# Patient Record
Sex: Female | Born: 1965 | Race: White | Hispanic: No | Smoking: Current every day smoker
Health system: Southern US, Community
[De-identification: ages and names within clinical notes are randomized; demographics above are authoritative.]

## PROBLEM LIST (undated history)

## (undated) DIAGNOSIS — F329 Major depressive disorder, single episode, unspecified: Secondary | ICD-10-CM

## (undated) DIAGNOSIS — I10 Essential (primary) hypertension: Secondary | ICD-10-CM

## (undated) DIAGNOSIS — R0602 Shortness of breath: Secondary | ICD-10-CM

## (undated) DIAGNOSIS — H547 Unspecified visual loss: Secondary | ICD-10-CM

## (undated) DIAGNOSIS — E039 Hypothyroidism, unspecified: Secondary | ICD-10-CM

## (undated) DIAGNOSIS — J449 Chronic obstructive pulmonary disease, unspecified: Secondary | ICD-10-CM

## (undated) DIAGNOSIS — M1990 Unspecified osteoarthritis, unspecified site: Secondary | ICD-10-CM

## (undated) DIAGNOSIS — E785 Hyperlipidemia, unspecified: Secondary | ICD-10-CM

## (undated) DIAGNOSIS — J9610 Chronic respiratory failure, unspecified whether with hypoxia or hypercapnia: Secondary | ICD-10-CM

## (undated) DIAGNOSIS — J45909 Unspecified asthma, uncomplicated: Secondary | ICD-10-CM

## (undated) DIAGNOSIS — E119 Type 2 diabetes mellitus without complications: Secondary | ICD-10-CM

## (undated) DIAGNOSIS — F419 Anxiety disorder, unspecified: Secondary | ICD-10-CM

## (undated) DIAGNOSIS — Z9889 Other specified postprocedural states: Secondary | ICD-10-CM

## (undated) DIAGNOSIS — E079 Disorder of thyroid, unspecified: Secondary | ICD-10-CM

## (undated) DIAGNOSIS — G4730 Sleep apnea, unspecified: Secondary | ICD-10-CM

## (undated) DIAGNOSIS — R112 Nausea with vomiting, unspecified: Secondary | ICD-10-CM

## (undated) DIAGNOSIS — H409 Unspecified glaucoma: Secondary | ICD-10-CM

## (undated) DIAGNOSIS — F32A Depression, unspecified: Secondary | ICD-10-CM

## (undated) HISTORY — DX: Type 2 diabetes mellitus without complications: E11.9

## (undated) HISTORY — PX: TONSILLECTOMY: SUR1361

## (undated) HISTORY — PX: ENDOMETRIAL ABLATION: SHX621

## (undated) HISTORY — DX: Anxiety disorder, unspecified: F41.9

## (undated) HISTORY — PX: TUBAL LIGATION: SHX77

---

## 2001-06-13 ENCOUNTER — Emergency Department (HOSPITAL_COMMUNITY): Admission: EM | Admit: 2001-06-13 | Discharge: 2001-06-13 | Payer: Self-pay

## 2001-06-26 ENCOUNTER — Emergency Department (HOSPITAL_COMMUNITY): Admission: EM | Admit: 2001-06-26 | Discharge: 2001-06-26 | Payer: Self-pay | Admitting: Emergency Medicine

## 2001-06-26 ENCOUNTER — Encounter: Payer: Self-pay | Admitting: Emergency Medicine

## 2002-06-06 ENCOUNTER — Emergency Department (HOSPITAL_COMMUNITY): Admission: EM | Admit: 2002-06-06 | Discharge: 2002-06-06 | Payer: Self-pay | Admitting: Emergency Medicine

## 2002-06-06 ENCOUNTER — Encounter: Payer: Self-pay | Admitting: Emergency Medicine

## 2004-11-28 ENCOUNTER — Emergency Department (HOSPITAL_COMMUNITY): Admission: EM | Admit: 2004-11-28 | Discharge: 2004-11-28 | Payer: Self-pay | Admitting: Emergency Medicine

## 2005-03-18 ENCOUNTER — Other Ambulatory Visit: Admission: RE | Admit: 2005-03-18 | Discharge: 2005-03-18 | Payer: Self-pay | Admitting: Obstetrics and Gynecology

## 2005-06-30 HISTORY — PX: ANKLE SURGERY: SHX546

## 2005-09-19 ENCOUNTER — Ambulatory Visit (HOSPITAL_BASED_OUTPATIENT_CLINIC_OR_DEPARTMENT_OTHER): Admission: RE | Admit: 2005-09-19 | Discharge: 2005-09-19 | Payer: Self-pay | Admitting: Family Medicine

## 2005-09-28 ENCOUNTER — Ambulatory Visit: Payer: Self-pay | Admitting: Internal Medicine

## 2006-09-15 ENCOUNTER — Inpatient Hospital Stay (HOSPITAL_COMMUNITY): Admission: EM | Admit: 2006-09-15 | Discharge: 2006-09-17 | Payer: Self-pay | Admitting: Emergency Medicine

## 2006-09-18 ENCOUNTER — Inpatient Hospital Stay (HOSPITAL_COMMUNITY): Admission: EM | Admit: 2006-09-18 | Discharge: 2006-09-25 | Payer: Self-pay | Admitting: Emergency Medicine

## 2010-08-02 ENCOUNTER — Encounter (HOSPITAL_COMMUNITY)
Admission: RE | Admit: 2010-08-02 | Discharge: 2010-08-02 | Disposition: A | Discharge status: Home / Self Care | Payer: BC Managed Care – PPO | Source: Ambulatory Visit | Attending: Obstetrics and Gynecology | Admitting: Obstetrics and Gynecology

## 2010-08-02 DIAGNOSIS — Z01812 Encounter for preprocedural laboratory examination: Secondary | ICD-10-CM | POA: Insufficient documentation

## 2010-08-02 DIAGNOSIS — Z01810 Encounter for preprocedural cardiovascular examination: Secondary | ICD-10-CM | POA: Insufficient documentation

## 2010-08-02 LAB — COMPREHENSIVE METABOLIC PANEL
AST: 26 U/L (ref 0–37)
CO2: 28 mEq/L (ref 19–32)
Calcium: 8.7 mg/dL (ref 8.4–10.5)
Creatinine, Ser: 0.54 mg/dL (ref 0.4–1.2)
GFR calc Af Amer: >60 mL/min (ref >60)
GFR calc non Af Amer: >60 mL/min (ref >60)
Glucose, Bld: 154 mg/dL — ABNORMAL HIGH (ref 70–99)

## 2010-08-02 LAB — CBC
Hemoglobin: 13.1 g/dL (ref 12.0–15.0)
MCH: 31 pg (ref 26.0–34.0)
MCHC: 33 g/dL (ref 30.0–36.0)

## 2010-08-09 ENCOUNTER — Ambulatory Visit (HOSPITAL_COMMUNITY)
Admission: RE | Admit: 2010-08-09 | Discharge: 2010-08-10 | Disposition: A | Discharge status: Home / Self Care | Payer: BC Managed Care – PPO | Source: Ambulatory Visit | Attending: Obstetrics and Gynecology | Admitting: Obstetrics and Gynecology

## 2010-08-09 ENCOUNTER — Other Ambulatory Visit: Payer: Self-pay | Admitting: Obstetrics and Gynecology

## 2010-08-09 DIAGNOSIS — N926 Irregular menstruation, unspecified: Secondary | ICD-10-CM | POA: Insufficient documentation

## 2010-08-09 DIAGNOSIS — Z01812 Encounter for preprocedural laboratory examination: Secondary | ICD-10-CM | POA: Insufficient documentation

## 2010-08-17 NOTE — Op Note (Addendum)
  [name], [name]             ACCOUNT NO.:  [id number]  MEDICAL RECORD NO.:  [id number]           PATIENT TYPE:  O  LOCATION:  SDC                           FACILITY:  [facility]  PHYSICIAN:  Guy Sandifer. Henderson Cloud, M.D. DATE OF BIRTH:  1966/06/13  DATE OF PROCEDURE:  08/09/2010 DATE OF DISCHARGE:  08/02/2010                              OPERATIVE REPORT   PREOPERATIVE DIAGNOSIS:  Irregular menses.  POSTOPERATIVE DIAGNOSIS:  Irregular menses.  PROCEDURE:  Hysteroscopy with dilatation and curettage.  SURGEON:  Guy Sandifer. Henderson Cloud, MD  ANESTHESIA:  General with LMA.  SPECIMENS:  Endometrial curettings to pathology.  ESTIMATED BLOOD LOSS:  Minimal.  I's and O's with distending media 0 mL deficit.  INDICATIONS AND CONSENT:  This patient is a 45 year old white female with irregular menses.  Attempted a sonohysterogram in the office was unsuccessful and cannulating the cervix.  Recommendation for hysteroscopy, D and C, possible resectoscope was made.  Potential risks and complications were discussed preoperatively including but limited to infection, uterine perforation, organ damage, bleeding requiring transfusion of blood products with HIV and hepatitis acquisition, DVT, PE, pneumonia, laparoscopy, laparotomy, recurrent abnormal bleeding. All questions had been answered and consent is signed on the chart.  FINDINGS:  Uterine cavities without abnormal structure.  Both fallopian tube and ostia are identified.  PROCEDURE:  The patient was taken to the operating room where she was identified.  She was placed in dorsal supine position and general anesthesia was induced via LMA.  She was then placed in dorsal lithotomy position.  Time-out undertaken.  She was then prepped with Betadine, bladder straight catheterized and she is draped in sterile fashion. Bivalve speculum was placed into the vagina.  The anterior cervical lip was injected with 1% plain Xylocaine and grasped with  single-tooth tenaculum.  Paracervical block was placed at 2, 4, 5, 7 and 10 o'clock positions with approximately 20 mL of the same solution.  Cervix was gently progressively dilated.  Diagnostic hysteroscope was placed in endocervical canal and advanced under direct visualization using distending media.  The above findings were noted.  Hysteroscope was withdrawn.  Sharp curettage was carried out for small amount of tissue.  Cavity was clean.  A good hemostasis was noted.  All instruments were removed.  All counts were correct.  The patient is awakened, taken to recovery room in stable condition.     Guy Sandifer Henderson Cloud, M.D.     JET/MEDQ  D:  08/09/2010  T:  08/09/2010  Job:  [id number]  Electronically Signed by Harold Hedge M.D. on 08/17/2010 12:09:50 PM
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
## 2010-11-15 NOTE — Discharge Summary (Signed)
[name], [name]             ACCOUNT NO.:  [id number]   MEDICAL RECORD NO.:  [id number]          PATIENT TYPE:  INP   LOCATION:  1509                         FACILITY:  [facility]   PHYSICIAN:  Kerrin Champagne, M.D.   DATE OF BIRTH:  [birth year]   DATE OF ADMISSION:  09/18/2006  DATE OF DISCHARGE:  09/25/2006                               DISCHARGE SUMMARY   ADDENDUM:  Initial discharge summary stated day of discharge to be September 23, 2006.  The patient actually stayed until September 25, 2006, secondary  to need for nursing home placement.  The social worker worked with [employer] finding a place to stay and obtaining payment for  this.  She was not authorized for skilled nursing facility until September 25, 2006, at which time [facility] and [employer] and [facility],  her carrier, had negotiated an acceptable plan for her transfer to a  nursing facility.  During the two additional days in the hospital, the  patient continued to receive physical therapy for ambulation and gait  training.  There was no change in her treatment plan.  She was initially  started on Cipro for urinary tract infection.  However, due to being on  Coumadin, she was switched to Septra DS one p.o. b.i.d. for her urinary  tract infection.  The patient did receive replacement splint and  rewrapping of her splint prior to discharge as well.  She was afebrile  and stable at the time of discharge to [facility] facility.  All  questions encouraged and answered.      Wende Neighbors, P.A.      Kerrin Champagne, M.D.  Electronically Signed    SMV/MEDQ  D:  11/10/2006  T:  11/10/2006  Job:  [id number]

## 2010-11-15 NOTE — Discharge Summary (Signed)
NAMELESLEE, SUIRE             ACCOUNT NO.:  [id number]   MEDICAL RECORD NO.:  [id number]          PATIENT TYPE:  INP   LOCATION:  1509                         FACILITY:  [facility]   PHYSICIAN:  Kerrin Champagne, M.D.   DATE OF BIRTH:  Sep 30, 1965   DATE OF ADMISSION:  09/18/2006  DATE OF DISCHARGE:  09/23/2006                         DISCHARGE SUMMARY - REFERRING   ADMISSION DIAGNOSES:  1. Status post open reduction and internal fixation of right ankle      fracture performed September 15, 2006, by Dr. August Saucer with discharge from      the hospital September 17, 2006, and failure to thrive after discharge      from home.  2. Morbid obesity.  3. History of hypothyroidism.  4. Blindness right eye.  5. Sleep apnea requiring CPAP machine.   DISCHARGE DIAGNOSES:  1. Status post open reduction and internal fixation of right ankle      fracture performed September 15, 2006, by Dr. August Saucer with discharge from      the hospital September 17, 2006, and failure to thrive after discharge      from home.  2. Morbid obesity.  3. History of hypothyroidism.  4. Blindness right eye.  5. Sleep apnea requiring CPAP machine.  6. Mild anemia.  7. Urinary tract infection.   PROCEDURE:  None during this hospitalization.   BRIEF HISTORY:  Patient is a 45 year old female who underwent open  reduction and internal fixation of a trimalleolar ankle fracture by Dr.  August Saucer on September 15, 2006.  She remained in the hospital until September 17, 2006, at which time she was discharged to her home.  At home patient was  unable to obtain help to get even from the bed to the chair.  She has  morbid obesity and her family members were not able to help her.  She  also had severe sleep apnea requiring a CPAP machine and often times is  quite somnolent and unsteady on her feet.  She returned to the emergency  room on September 18, 2006, with failure to thrive.  At that time she was  readmitted to the hospital for nursing home placement.  Upon  admission,  physical therapy was reinitiated.  She was nonweightbearing on the right  lower extremity.  She did have a posterior splint in place.  She was  instructed in ice and elevation during the hospital stay.  With physical  therapy, she was able to do bed to chair transfers only.  Patient was  treated by respiratory care for her CPAP machine.  Bariatric equipment  was made available for her during the hospital stay.  Case management  assisted in nursing home placement.  Patient eventually was offered a  bed at [facility] where she accepted their offer and will be  transferred for continuation of her rehabilitation with the final goal  being to return to her home.  Patient was on Coumadin for DVT  prophylaxis, monitored by the pharmacy at [facility].  Urinalysis has returned with urinary tract infection and patient will be  placed on Cipro the day  of discharge for her urinary tract infection.   Other pertinent laboratory values show patient to be mildly anemic with  hemoglobin 10.2, hematocrit 29.8.  Chemistry studies on September 20, 2006,  were within normal limits with the exception of calcium 8.3 and glucose  133.  Last INR on September 20, 2006, was 1.1.  Repeat x-rays of the ankle  showed excellent position and alignment of the trimalleolar ankle  fracture.  CT of the chest showed no evidence of pulmonary emboli or  thoracic aortic dissection/aneurysm.  Mild ground glass opacity suggests  pulmonary edema with inflammatory or infectious etiologies less likely,  moderate to severe emphysema.  Chest x-ray showed mild edema and  atelectasis.   EKG on September 18, 2006, showed normal sinus rhythm.   PLAN:  Patient will be transferred to [facility].  There  she should receive physical therapy on a daily basis for bed to chair  transfers and ambulation.  She is strict nonweightbearing on the right  lower extremity.  Her splint should remain dry and clean at all  times.  Elevation to the right lower extremity when she is at rest.  Patient  should receive occupational therapy for ADLs.   MEDICATIONS:  1. Colace 100 mg p.o. b.i.d.  2. Levothyroxine 150 mcg p.o. daily.  3. Coumadin which should be continued for at least six weeks      postoperatively for DVT prophylaxis.  This should be adjusted by      the physicians at the nursing facility.  Her last dose at the      hospital was 5 mg on a daily basis.  4. She is receiving Vicodin 1-2 every 4-6 hours as needed for pain.  5. Darvocet N 100 one every 6 hours as needed for mild pain.   Patient should follow up with Dr. August Saucer at his office in approximately 7-  10 days.  The appointment should be made by calling [phone].  She  should be on a regular diet.  A weight loss consult would be very  beneficial in this patient and therefore nutrition consult could  possibly be done at the nursing facility.  She should receive Durable  Medical Equipment which is safe from a bariatric standpoint.   CONDITION ON DISCHARGE:  Stable.   All questions encouraged and answered prior to discharge.      Wende Neighbors, P.A.      Kerrin Champagne, M.D.  Electronically Signed    SMV/MEDQ  D:  09/23/2006  T:  09/23/2006  Job:  [id number]
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
## 2010-11-15 NOTE — Procedures (Signed)
[name], [name]                ACCOUNT NO.:  [id number]   MEDICAL RECORD NO.:  [id number]          PATIENT TYPE:  OUT   LOCATION:  SLEEP CENTER                 FACILITY:  [facility]   PHYSICIAN:  Clinton D. Maple Hudson, M.D. DATE OF BIRTH:  [birth year]   DATE OF STUDY:  09/19/2005                              NOCTURNAL POLYSOMNOGRAM   REFERRING PHYSICIAN:  Dr. Herb Grays   INDICATION FOR STUDY:  Hypersomnia with sleep apnea.   EPWORTH SLEEPINESS SCORE:  11/24.  BMI 47.  Weight 280 pounds.   MEDICATIONS:  Levothyroxine.   SLEEP ARCHITECTURE:  Total sleep time 299 minutes with sleep efficiency 83%.  Stage I was 16%, stage II 43%, stages III and IV 15%.  REM 26% of total  sleep time.  Sleep latency 3 minutes, REM latency 4 minutes.  Awake after  sleep onset 59 minutes.  Arousal index markedly increased at 87 per hour  indicating marked sleep fragmentation.  No bedtime medication was reported   RESPIRATORY DATA:  Split study protocol.  Apnea/hypopnea index (AHI, RDI)  166.7 obstructive events per hour indicating very severe obstructive sleep  apnea/hypopnea syndrome before CPAP.  This included 241 obstructive apneas,  two central apneas, eight mixed apneas, and 88 hypopneas before CPAP  control.  Events were not positional.  REM AHI 41.5 per hour.  C pap was  titrated to 23 CWP, AHI 0 per hour.  A medium ResMed Ultra Mirage nasal/oral  mask was used with a heated humidifier.  She asked to take the mask off  twice during the night.  BiPAP will likely be required for comfort as CPAP  was tolerated poorly.   OXYGEN DATA:  Very loud snoring with oxygen desaturation to a nadir of 59%  before CPAP control.  After CPAP control saturation held at 90% on room air.   CARDIAC DATA:  Normal sinus rhythm.   MOVEMENT-PARASOMNIA:  Occasional leg jerk, insignificant.   IMPRESSIONS-RECOMMENDATIONS:  1.  Very severe obstructive sleep apnea/hypopnea syndrome, AHI 166.7 per      hour with non-positional  events, very loud snoring, and oxygen      desaturation to 59%.  2.  Successful CPAP control at 23 CWP, AHI 0 per hour.  A medium ResMed      ultra mirage nasal/oral mask was used with a heated humidifier.  CPAP      markedly improved her sleep fragmentation and sleep architecture as well      as oxygenation, but was not comfortably tolerated by the patient.      Suggest that initial trial be with BiPAP      inspiratory 23, expiratory 10 and possibly with my BiFlex.  She may also      require some experience with different masks and use of a sedative      hypnotic during her initial weeks of adjustment to home CPAP/BiPAP.      Clinton D. Maple Hudson, M.D.  Diplomate, Biomedical engineer of Sleep Medicine  Electronically Signed     CDY/MEDQ  D:  09/28/2005 11:26:42  T:  09/29/2005 12:01:43  Job:  [id number]
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
## 2010-11-15 NOTE — Op Note (Signed)
NAMENYJAH, SCHWAKE             ACCOUNT NO.:  [id number]   MEDICAL RECORD NO.:  [id number]          PATIENT TYPE:  INP   LOCATION:  8469                         FACILITY:  [facility]   PHYSICIAN:  Burnard Bunting, M.D.    DATE OF BIRTH:  [birth year]   DATE OF PROCEDURE:  09/15/2006  DATE OF DISCHARGE:                               OPERATIVE REPORT   PREOPERATIVE DIAGNOSIS:  Right trimalleolar ankle fracture.   POSTOPERATIVE DIAGNOSIS:  Right trimalleolar ankle fracture.   PROCEDURE:  Right trimalleolar ankle fracture open reduction/internal  fixation.   SURGEON:  Burnard Bunting, M.D.   ASSISTANT:  Jerolyn Shin. Tresa Res, M.D.   ANESTHESIA:  General endotracheal.   ESTIMATED BLOOD LOSS:  Minimal.   An ankle esmarch tourniquet utilized for one hour and 10 minutes.   INDICATIONS:  [name] is a 45 year old morbidly obese 385 pound  patient with trimalleolar right ankle fracture.  She presents now for  operative management after extensive and lengthy discussion of risks and  benefits.   PROCEDURE IN DETAIL:  Patient is brought to the operating room, where  general endotracheal anesthesia was received.  Prepped, antibiotics  administered.  Right ankle and leg and foot was prepped with DuraPrep  solution and draped in a sterile manner.  [name] was used to  __cover________  the operative field.  A 6 inch esmarch was utilized for  proximal ankle tourniquet.  A lateral approach to the lateral malleolus  was made.  Superficial peroneal nerve was identified and protected.  The  fracture site was identified.  The lateral malleolar fracture was  reduced.  One lag screw was placed proximal and anterior to the distal  posterior.  This was secured with an 8 hole.  A one-third tubular  locking plate with good fixation achieved.  Syndesmosis was stable.  The  incision was irrigated, partially closing with a 2-0 Vicryl suture.  The  medial malleolus was then addressed.  The skin was incised.   Fracture  was involved, both the anterior and posterior caliculus.  After  reduction of the fracture, the posterior caliculus was secured with one  4-0 cannulated cancellous screw.  The anterior caliculus was secured  with two 3.5 cannulated screws.  Reduction was confirmed in the AP and  lateral plane under fluoroscopic guidance.  Both incisions were  thoroughly irrigated.  The tourniquet was released.  Bleeding points  encountered were controlled using electrocautery.  The skin was closed  with interrupted inverted 2-0 Vicryl sutures followed by 3-0 nylon  sutures.  A bulky posterior splint was applied.  The patient was  extubated and transferred to the recovery room.   It should be noted that this patient is morbidly obese and required the  expert assistance of Dr. Vear Clock for retraction and protection  __________ neurovascular structures, due in large part to her size as  well as the nature of the case.  His help was indispensable in the  fixation of the fracture.      Burnard Bunting, M.D.  Electronically Signed     GSD/MEDQ  D:  09/15/2006  T:  09/16/2006  Job:  [id number]

## 2011-08-01 ENCOUNTER — Encounter (HOSPITAL_COMMUNITY): Payer: Self-pay | Admitting: Emergency Medicine

## 2011-08-01 ENCOUNTER — Emergency Department (HOSPITAL_COMMUNITY)
Admission: EM | Admit: 2011-08-01 | Discharge: 2011-08-01 | Disposition: A | Discharge status: Home / Self Care | Payer: Self-pay | Attending: Emergency Medicine | Admitting: Emergency Medicine

## 2011-08-01 DIAGNOSIS — S93409A Sprain of unspecified ligament of unspecified ankle, initial encounter: Secondary | ICD-10-CM | POA: Insufficient documentation

## 2011-08-01 DIAGNOSIS — S91209A Unspecified open wound of unspecified toe(s) with damage to nail, initial encounter: Secondary | ICD-10-CM

## 2011-08-01 DIAGNOSIS — W010XXA Fall on same level from slipping, tripping and stumbling without subsequent striking against object, initial encounter: Secondary | ICD-10-CM | POA: Insufficient documentation

## 2011-08-01 DIAGNOSIS — E785 Hyperlipidemia, unspecified: Secondary | ICD-10-CM | POA: Insufficient documentation

## 2011-08-01 DIAGNOSIS — E079 Disorder of thyroid, unspecified: Secondary | ICD-10-CM | POA: Insufficient documentation

## 2011-08-01 DIAGNOSIS — I10 Essential (primary) hypertension: Secondary | ICD-10-CM | POA: Insufficient documentation

## 2011-08-01 HISTORY — DX: Essential (primary) hypertension: I10

## 2011-08-01 HISTORY — DX: Hyperlipidemia, unspecified: E78.5

## 2011-08-01 HISTORY — DX: Disorder of thyroid, unspecified: E07.9

## 2011-08-01 MED ORDER — BACITRACIN ZINC 500 UNIT/GM EX OINT
TOPICAL_OINTMENT | Freq: Once | CUTANEOUS | Status: AC
  Administered 2011-08-01: 1 via TOPICAL
  Filled 2011-08-01: qty 0.9

## 2011-08-01 MED ORDER — HYDROCODONE-ACETAMINOPHEN 5-325 MG PO TABS
2.0000 | ORAL_TABLET | Freq: Once | ORAL | Status: AC
  Administered 2011-08-01: 2 via ORAL
  Filled 2011-08-01: qty 2

## 2011-08-01 MED ORDER — HYDROCODONE-ACETAMINOPHEN 5-500 MG PO TABS: 1.0000 | ORAL_TABLET | Freq: Four times a day (QID) | ORAL | Status: AC | PRN

## 2011-08-01 NOTE — ED Notes (Signed)
Dc instructions reviewed with pt and pt voiced understanding .splint applied and pt tolerated without difficulty. nad noted prior to dc. 1 script given to pt. Ambulated out without difficulty.

## 2011-08-01 NOTE — ED Provider Notes (Signed)
History    This chart was scribed for Julie Roots, MD, MD by Smitty Pluck. The patient was seen in room APA19 and the patient's care was started at 7:41AM.   CSN: [id number]  Arrival date & time 08/01/11  0719   First MD Initiated Contact with Patient 08/01/11 [phone]      Chief Complaint  Patient presents with  . Fall  . Ankle Pain    (Consider location/radiation/quality/duration/timing/severity/associated sxs/prior treatment) Patient is a 46 y.o. female presenting with fall and ankle pain. The history is provided by the patient.  Fall  Ankle Pain    [name] is a 46 y.o. female who presents to the Emergency Department complaining of moderate left ankle and foot pain after slipping on floor and twisting ankle onset today.Pt reports that she tore the great toe nail off of the left foot. The pain has been constant since the onset without radiation. Pt is able to walk on ankle but pain aggravates it. Pt denies LOC, knee injury and head injury. Pt reports that her last tetanus shot was 6 years ago.   Past Medical History  Diagnosis Date  . Thyroid disease   . Hypertension   . Hyperlipidemia     Past Surgical History  Procedure Date  . Tubal ligation   . Ankle surgery     History reviewed. No pertinent family history.  History  Substance Use Topics  . Smoking status: Current Everyday Smoker    Types: Cigarettes  . Smokeless tobacco: Not on file  . Alcohol Use: No    OB History    Grav Para Term Preterm Abortions TAB SAB Ect Mult Living                  Review of Systems  All other systems reviewed and are negative.  no headache. No neck or back pain. No foot numbness/weakness.  10 Systems reviewed and are negative for acute change except as noted in the HPI.  Allergies  Review of patient's allergies indicates no known allergies.  Home Medications  No current outpatient prescriptions on file.  BP 149/92  Pulse 94  Temp(Src) 98.6 F (37 C) (Oral)   Resp 18  Ht 5\' 5"  (1.651 m)  Wt 278 lb (126.1 kg)  BMI 46.26 kg/m2  SpO2 94%  LMP 07/25/2011  Physical Exam  Nursing note and vitals reviewed. Constitutional: She is oriented to person, place, and time. She appears well-developed and well-nourished. No distress.  HENT:  Head: Normocephalic and atraumatic.  Eyes: EOM are normal. Pupils are equal, round, and reactive to light.  Neck: Normal range of motion. Neck supple. No tracheal deviation present.  Cardiovascular: Normal rate, regular rhythm, normal heart sounds and intact distal pulses.   Pulmonary/Chest: Effort normal. No respiratory distress.  Abdominal: Soft. She exhibits no distension.  Musculoskeletal: Normal range of motion.       Mild swelling laterally No malleolar or bony tenderness Ankle stable  dp intact Avulsed toe nail No focal bony tenderness  Neurological: She is alert and oriented to person, place, and time.  Skin: Skin is warm and dry.  Psychiatric: She has a normal mood and affect. Her behavior is normal.    ED Course  Procedures (including critical care time)  DIAGNOSTIC STUDIES: Oxygen Saturation is 94% on room air, normal by my interpretation.    COORDINATION OF CARE: 7:45AM EDP ordered medication: Norco and bacitracin ointment 7:54AM EDP discusses pt ED treatment and at home care.  Pt is ready for discharge      MDM  aso brace applied to left ankle by staff. vicodin po for pain (confirmed nkda w pt, and pt states has ride home, does not have to drive). Pt asks about work note.       I personally performed the services described in this documentation, which was scribed in my presence. The recorded information has been reviewed and considered. Julie Roots, MD  Julie Roots, MD 08/01/11 [phone]

## 2011-08-01 NOTE — ED Notes (Signed)
Pt states she twisted her ankle this am and tore the great toe nail off the left foot.

## 2011-11-07 ENCOUNTER — Emergency Department (HOSPITAL_COMMUNITY)
Admission: EM | Admit: 2011-11-07 | Discharge: 2011-11-08 | Disposition: A | Discharge status: Home / Self Care | Payer: Self-pay | Attending: Emergency Medicine | Admitting: Emergency Medicine

## 2011-11-07 ENCOUNTER — Encounter (HOSPITAL_COMMUNITY): Payer: Self-pay | Admitting: *Deleted

## 2011-11-07 DIAGNOSIS — E785 Hyperlipidemia, unspecified: Secondary | ICD-10-CM | POA: Insufficient documentation

## 2011-11-07 DIAGNOSIS — Z79899 Other long term (current) drug therapy: Secondary | ICD-10-CM | POA: Insufficient documentation

## 2011-11-07 DIAGNOSIS — E079 Disorder of thyroid, unspecified: Secondary | ICD-10-CM | POA: Insufficient documentation

## 2011-11-07 DIAGNOSIS — J019 Acute sinusitis, unspecified: Secondary | ICD-10-CM | POA: Insufficient documentation

## 2011-11-07 DIAGNOSIS — I10 Essential (primary) hypertension: Secondary | ICD-10-CM | POA: Insufficient documentation

## 2011-11-07 MED ORDER — AMOXICILLIN 250 MG PO CAPS
500.0000 mg | ORAL_CAPSULE | Freq: Once | ORAL | Status: AC
  Administered 2011-11-07: 500 mg via ORAL
  Filled 2011-11-07: qty 2

## 2011-11-07 MED ORDER — PROMETHAZINE-CODEINE 6.25-10 MG/5ML PO SYRP: 5.0000 mL | ORAL_SOLUTION | ORAL | Status: AC | PRN

## 2011-11-07 MED ORDER — BENZONATATE 100 MG PO CAPS: 100.0000 mg | ORAL_CAPSULE | Freq: Three times a day (TID) | ORAL | Status: AC

## 2011-11-07 MED ORDER — BENZONATATE 100 MG PO CAPS
200.0000 mg | ORAL_CAPSULE | Freq: Once | ORAL | Status: AC
  Administered 2011-11-07: 200 mg via ORAL
  Filled 2011-11-07: qty 1

## 2011-11-07 MED ORDER — AMOXICILLIN 500 MG PO CAPS: 500.0000 mg | ORAL_CAPSULE | Freq: Three times a day (TID) | ORAL | Status: AC

## 2011-11-07 NOTE — ED Notes (Signed)
Cough, headache, green sputum .

## 2011-11-08 NOTE — Discharge Instructions (Signed)
Sinusitis Sinuses are air pockets within the bones of your face. The growth of bacteria within a sinus leads to infection. The infection prevents the sinuses from draining. This infection is called sinusitis. SYMPTOMS  There will be different areas of pain depending on which sinuses have become infected.  The maxillary sinuses often produce pain beneath the eyes.   Frontal sinusitis may cause pain in the middle of the forehead and above the eyes.  Other problems (symptoms) include:  Toothaches.   Colored, pus-like (purulent) drainage from the nose.   Swelling, warmth, and tenderness over the sinus areas may be signs of infection.  TREATMENT  Sinusitis is most often determined by an exam.X-rays may be taken. If x-rays have been taken, make sure you obtain your results or find out how you are to obtain them. Your caregiver may give you medications (antibiotics). These are medications that will help kill the bacteria causing the infection. You may also be given a medication (decongestant) that helps to reduce sinus swelling.  HOME CARE INSTRUCTIONS   Only take over-the-counter or prescription medicines for pain, discomfort, or fever as directed by your caregiver.   Drink extra fluids. Fluids help thin the mucus so your sinuses can drain more easily.   Applying either moist heat or ice packs to the sinus areas may help relieve discomfort.   Use saline nasal sprays to help moisten your sinuses. The sprays can be found at your local drugstore.  SEEK IMMEDIATE MEDICAL CARE IF:  You have a fever.   You have increasing pain, severe headaches, or toothache.   You have nausea, vomiting, or drowsiness.   You develop unusual swelling around the face or trouble seeing.  MAKE SURE YOU:   Understand these instructions.   Will watch your condition.   Will get help right away if you are not doing well or get worse.  Document Released: 06/16/2005 Document Revised: 06/05/2011 Document Reviewed:  01/13/2007 [facility] Patient Information 2012 [place], [place].   Take your next dose of Amoxil tomorrow morning.  You may use the Tessalon during the day to help control your coughing.  The Phenergan With Codeine will also help control your coughing but it is a narcotic and will make you drowsy.  Do not drive within 4 hours of taking this cough syrup.  Make sure you take the entire course of your antibiotics.  Get rechecked if your symptoms are not improving over the next week.  You may also benefit by getting some Coricidin brand decongestant which will help with her nasal congestion but should not jittery like Sudafed can normal at interfere with your blood pressure.

## 2011-11-09 NOTE — ED Provider Notes (Signed)
History     CSN: [id number]  Arrival date & time 11/07/11  2201   First MD Initiated Contact with Patient 11/07/11 2251      Chief Complaint  Patient presents with  . Cough    (Consider location/radiation/quality/duration/timing/severity/associated sxs/prior treatment) HPI Comments: [name] presents for treatment of a sinus infection,  Which she has experienced in the past.  Symptoms include frontal headache,  Pressure in her forehead,  Nasal congestion with purulent and blood tinged nasal congestion and post nasal drip.  She has had cough and subjective fevers.  She denies shortness of breath and wheezing.  She has tried tylenol cough and cold which has not relieved her symptoms but she blames on causing insomnia.     The history is provided by the patient.    Past Medical History  Diagnosis Date  . Thyroid disease   . Hypertension   . Hyperlipidemia     Past Surgical History  Procedure Date  . Tubal ligation   . Ankle surgery     History reviewed. No pertinent family history.  History  Substance Use Topics  . Smoking status: Current Everyday Smoker    Types: Cigarettes  . Smokeless tobacco: Not on file  . Alcohol Use: No    OB History    Grav Para Term Preterm Abortions TAB SAB Ect Mult Living                  Review of Systems  Constitutional: Positive for fever.  HENT: Positive for congestion, rhinorrhea, postnasal drip and sinus pressure. Negative for sore throat, trouble swallowing and neck pain.   Eyes: Negative.   Respiratory: Negative for chest tightness, shortness of breath and wheezing.   Cardiovascular: Negative for chest pain.  Gastrointestinal: Negative for nausea and abdominal pain.  Genitourinary: Negative.   Musculoskeletal: Negative for joint swelling and arthralgias.  Skin: Negative.  Negative for rash and wound.  Neurological: Negative for dizziness, weakness, light-headedness, numbness and headaches.  Hematological: Negative.    Psychiatric/Behavioral: Negative.     Allergies  Review of patient's allergies indicates no known allergies.  Home Medications   Current Outpatient Rx  Name Route Sig Dispense Refill  . ALBUTEROL SULFATE HFA 108 (90 BASE) MCG/ACT IN AERS Inhalation Inhale 2 puffs into the lungs every 6 (six) hours as needed. FOR WHEEZING/SHORTNESS OF BREATH    . ASPIRIN EC 81 MG PO TBEC Oral Take 81 mg by mouth daily.    . FENOFIBRATE MICRONIZED 43 MG PO CAPS Oral Take 43 mg by mouth daily.    Marland Kitchen LEVOTHYROXINE SODIUM 150 MCG PO TABS Oral Take 150 mcg by mouth daily.    Marland Kitchen LOSARTAN POTASSIUM-HCTZ 100-25 MG PO TABS Oral Take 1 tablet by mouth daily.    Marland Kitchen PHENYLEPHRINE-DM-GG 5-10-100 MG/5ML PO LIQD Oral Take 15 mLs by mouth as needed. FOR COUGH    . PSEUDOEPH-CPM-DM-APAP 30-2-15-325 MG PO TABS Oral Take 2 tablets by mouth as needed. FOR COUGH    . PSEUDOEPHEDRINE-GUAIFENESIN ER [phone] MG PO TB12 Oral Take 1 tablet by mouth 2 (two) times daily as needed. FOR SYMPTOMS    . AMOXICILLIN 500 MG PO CAPS Oral Take 1 capsule (500 mg total) by mouth 3 (three) times daily. 30 capsule 0  . BENZONATATE 100 MG PO CAPS Oral Take 1 capsule (100 mg total) by mouth every 8 (eight) hours. 21 capsule 0  . PROMETHAZINE-CODEINE 6.25-10 MG/5ML PO SYRP Oral Take 5 mLs by mouth every 4 (four)  hours as needed for cough. 120 mL 0    BP 120/68  Pulse 80  Temp(Src) 98.2 F (36.8 C) (Oral)  Resp 20  Ht 5\' 5"  (1.651 m)  Wt 270 lb (122.471 kg)  BMI 44.93 kg/m2  SpO2 94%  LMP 11/01/2011  Physical Exam  Nursing note and vitals reviewed. Constitutional: She appears well-developed and well-nourished.  HENT:  Head: Normocephalic and atraumatic.  Right Ear: Tympanic membrane normal.  Left Ear: Tympanic membrane normal.  Nose: Mucosal edema and rhinorrhea present. Left sinus exhibits frontal sinus tenderness.  Mouth/Throat: Uvula is midline, oropharynx is clear and moist and mucous membranes are normal.  Eyes: Conjunctivae are  normal.  Neck: Normal range of motion.  Cardiovascular: Normal rate, regular rhythm, normal heart sounds and intact distal pulses.   Pulmonary/Chest: Effort normal and breath sounds normal. She has no wheezes.  Abdominal: Soft. Bowel sounds are normal. There is no tenderness.  Musculoskeletal: Normal range of motion.  Neurological: She is alert.  Skin: Skin is warm and dry.  Psychiatric: She has a normal mood and affect.    ED Course  Procedures (including critical care time)  Labs Reviewed - No data to display No results found.   1. Acute sinusitis       MDM  Amoxil,  Tessalon given in ed and prescribed.  Also prescribed phenergan with codeine for night time cough and insomnia.  Encouraged to dc tylenol otc and replace with coricidin which will not interfere with bp and should not keep awake .  Recheck if not improved over the next week.  Also suggested warm compresses to face,  altoids or halls menthol drops for sinus pressure relief.        Burgess Amor, PA 11/09/11 1459

## 2011-11-09 NOTE — ED Provider Notes (Signed)
Medical screening examination/treatment/procedure(s) were performed by non-physician practitioner and as supervising physician I was immediately available for consultation/collaboration.  Barlow Harrison, MD 11/09/11 2354 

## 2012-01-22 ENCOUNTER — Emergency Department (HOSPITAL_COMMUNITY)
Admission: EM | Admit: 2012-01-22 | Discharge: 2012-01-22 | Disposition: A | Discharge status: Home / Self Care | Payer: Self-pay | Attending: Emergency Medicine | Admitting: Emergency Medicine

## 2012-01-22 ENCOUNTER — Encounter (HOSPITAL_COMMUNITY): Payer: Self-pay | Admitting: Emergency Medicine

## 2012-01-22 DIAGNOSIS — I8390 Asymptomatic varicose veins of unspecified lower extremity: Secondary | ICD-10-CM | POA: Insufficient documentation

## 2012-01-22 DIAGNOSIS — E079 Disorder of thyroid, unspecified: Secondary | ICD-10-CM | POA: Insufficient documentation

## 2012-01-22 DIAGNOSIS — I83899 Varicose veins of unspecified lower extremities with other complications: Secondary | ICD-10-CM

## 2012-01-22 DIAGNOSIS — I10 Essential (primary) hypertension: Secondary | ICD-10-CM | POA: Insufficient documentation

## 2012-01-22 DIAGNOSIS — F17200 Nicotine dependence, unspecified, uncomplicated: Secondary | ICD-10-CM | POA: Insufficient documentation

## 2012-01-22 DIAGNOSIS — E785 Hyperlipidemia, unspecified: Secondary | ICD-10-CM | POA: Insufficient documentation

## 2012-01-22 MED ORDER — LIDOCAINE HCL (PF) 1 % IJ SOLN
5.0000 mL | Freq: Once | INTRAMUSCULAR | Status: AC
  Administered 2012-01-22: 14:00:00

## 2012-01-22 MED ORDER — LIDOCAINE HCL (PF) 1 % IJ SOLN
INTRAMUSCULAR | Status: AC
  Filled 2012-01-22: qty 5

## 2012-01-22 NOTE — ED Notes (Signed)
Patient cut left ankle will shaving leg. Per patient small puncture wound above vein that keeps bleeding. Patient has soaked towel and chuck pad. Pressure applied. No active bleeding noted.

## 2012-01-22 NOTE — ED Provider Notes (Signed)
History     CSN: [id number]  Arrival date & time 01/22/12  1219   First MD Initiated Contact with Patient 01/22/12 1258      Chief Complaint  Patient presents with  . Puncture Wound    (Consider location/radiation/quality/duration/timing/severity/associated sxs/prior treatment) HPI Comments: Pt was shaving legs and cut varicose vein with subsequent bleeding.  The history is provided by the patient. No language interpreter was used.    Past Medical History  Diagnosis Date  . Thyroid disease   . Hypertension   . Hyperlipidemia     Past Surgical History  Procedure Date  . Tubal ligation   . Ankle surgery     Family History  Problem Relation Age of Onset  . Cancer Mother   . Heart failure Father     History  Substance Use Topics  . Smoking status: Current Everyday Smoker -- 0.5 packs/day for 30 years    Types: Cigarettes  . Smokeless tobacco: Never Used  . Alcohol Use: No    OB History    Grav Para Term Preterm Abortions TAB SAB Ect Mult Living   2 2 2       2       Review of Systems  Skin: Positive for wound.  Hematological: Does not bruise/bleed easily.  All other systems reviewed and are negative.    Allergies  Review of patient's allergies indicates no known allergies.  Home Medications   Current Outpatient Rx  Name Route Sig Dispense Refill  . ALBUTEROL SULFATE HFA 108 (90 BASE) MCG/ACT IN AERS Inhalation Inhale 2 puffs into the lungs every 6 (six) hours as needed. FOR WHEEZING/SHORTNESS OF BREATH    . ASPIRIN EC 81 MG PO TBEC Oral Take 81 mg by mouth daily.    . FENOFIBRATE MICRONIZED 43 MG PO CAPS Oral Take 43 mg by mouth daily.    Marland Kitchen LEVOTHYROXINE SODIUM 150 MCG PO TABS Oral Take 150 mcg by mouth daily.    Marland Kitchen LOSARTAN POTASSIUM-HCTZ 100-25 MG PO TABS Oral Take 1 tablet by mouth daily.    Marland Kitchen PHENYLEPHRINE-DM-GG 5-10-100 MG/5ML PO LIQD Oral Take 15 mLs by mouth as needed. FOR COUGH    . PSEUDOEPH-CPM-DM-APAP 30-2-15-325 MG PO TABS Oral Take 2  tablets by mouth as needed. FOR COUGH    . PSEUDOEPHEDRINE-GUAIFENESIN ER [phone] MG PO TB12 Oral Take 1 tablet by mouth 2 (two) times daily as needed. FOR SYMPTOMS      BP 146/94  Pulse 89  Temp 97.8 F (36.6 C) (Oral)  Resp 21  Ht 5\' 4"  (1.626 m)  Wt 270 lb (122.471 kg)  BMI 46.35 kg/m2  SpO2 92%  LMP 01/18/2012  Physical Exam  Nursing note and vitals reviewed. Constitutional: She is oriented to person, place, and time. She appears well-developed and well-nourished. No distress.  HENT:  Head: Normocephalic and atraumatic.  Eyes: EOM are normal.  Neck: Normal range of motion.  Cardiovascular: Normal rate, regular rhythm and normal heart sounds.   Pulmonary/Chest: Effort normal and breath sounds normal.  Abdominal: Soft. She exhibits no distension. There is no tenderness.  Musculoskeletal: Normal range of motion.       Feet:  Neurological: She is alert and oriented to person, place, and time.  Skin: Skin is warm and dry.  Psychiatric: She has a normal mood and affect. Judgment normal.    ED Course  LACERATION REPAIR Date/Time: 01/22/2012 1:15 PM Performed by: Evalina Field Authorized by: Evalina Field Consent: Verbal consent obtained. Written consent  not obtained. Risks and benefits: risks, benefits and alternatives were discussed Consent given by: patient Patient understanding: patient states understanding of the procedure being performed Patient consent: the patient's understanding of the procedure matches consent given Site marked: the operative site was not marked Imaging studies: imaging studies not available Patient identity confirmed: verbally with patient Time out: Immediately prior to procedure a "time out" was called to verify the correct patient, procedure, equipment, support staff and site/side marked as required. Anesthesia: local infiltration Local anesthetic: lidocaine 1% with epinephrine Anesthetic total: 1 ml Patient sedated: no Irrigation  solution: saline Irrigation method: syringe Amount of cleaning: standard Debridement: none Degree of undermining: none Skin closure: 4-0 nylon Technique: simple Approximation: close Approximation difficulty: simple Patient tolerance: Patient tolerated the procedure well with no immediate complications. Comments: Direct pressure and hemostasis achieved with a Q-tip.  Figure 8 suture used to stop bleeding.  Pressure dressing then applied.   (including critical care time)  Labs Reviewed - No data to display No results found.   1. Bleeding from varicose vein       MDM  Return as needed.        Evalina Field, [place] 01/22/12 1343

## 2012-01-23 NOTE — ED Provider Notes (Signed)
Medical screening examination/treatment/procedure(s) were performed by non-physician practitioner and as supervising physician I was immediately available for consultation/collaboration.  Dhriti Fales, MD 01/23/12 0737 

## 2012-05-13 ENCOUNTER — Emergency Department (HOSPITAL_COMMUNITY): Payer: Self-pay

## 2012-05-13 ENCOUNTER — Encounter (HOSPITAL_COMMUNITY): Payer: Self-pay | Admitting: Emergency Medicine

## 2012-05-13 ENCOUNTER — Emergency Department (HOSPITAL_COMMUNITY)
Admission: EM | Admit: 2012-05-13 | Discharge: 2012-05-13 | Disposition: A | Discharge status: Home / Self Care | Payer: Self-pay | Attending: Emergency Medicine | Admitting: Emergency Medicine

## 2012-05-13 DIAGNOSIS — S42309A Unspecified fracture of shaft of humerus, unspecified arm, initial encounter for closed fracture: Secondary | ICD-10-CM

## 2012-05-13 DIAGNOSIS — Z79899 Other long term (current) drug therapy: Secondary | ICD-10-CM | POA: Insufficient documentation

## 2012-05-13 DIAGNOSIS — Z7982 Long term (current) use of aspirin: Secondary | ICD-10-CM | POA: Insufficient documentation

## 2012-05-13 DIAGNOSIS — E785 Hyperlipidemia, unspecified: Secondary | ICD-10-CM | POA: Insufficient documentation

## 2012-05-13 DIAGNOSIS — Y9389 Activity, other specified: Secondary | ICD-10-CM | POA: Insufficient documentation

## 2012-05-13 DIAGNOSIS — F17200 Nicotine dependence, unspecified, uncomplicated: Secondary | ICD-10-CM | POA: Insufficient documentation

## 2012-05-13 DIAGNOSIS — E079 Disorder of thyroid, unspecified: Secondary | ICD-10-CM | POA: Insufficient documentation

## 2012-05-13 DIAGNOSIS — W19XXXA Unspecified fall, initial encounter: Secondary | ICD-10-CM | POA: Insufficient documentation

## 2012-05-13 DIAGNOSIS — S42293A Other displaced fracture of upper end of unspecified humerus, initial encounter for closed fracture: Secondary | ICD-10-CM | POA: Insufficient documentation

## 2012-05-13 DIAGNOSIS — I10 Essential (primary) hypertension: Secondary | ICD-10-CM | POA: Insufficient documentation

## 2012-05-13 DIAGNOSIS — Y9289 Other specified places as the place of occurrence of the external cause: Secondary | ICD-10-CM | POA: Insufficient documentation

## 2012-05-13 MED ORDER — OXYCODONE-ACETAMINOPHEN 5-325 MG PO TABS: 1.0000 | ORAL_TABLET | ORAL | Status: DC | PRN

## 2012-05-13 MED ORDER — HYDROMORPHONE HCL PF 1 MG/ML IJ SOLN
1.0000 mg | Freq: Once | INTRAMUSCULAR | Status: AC
  Administered 2012-05-13: 1 mg via INTRAVENOUS
  Filled 2012-05-13: qty 1

## 2012-05-13 MED ORDER — ONDANSETRON 8 MG PO TBDP: 8.0000 mg | ORAL_TABLET | Freq: Three times a day (TID) | ORAL | Status: DC | PRN

## 2012-05-13 NOTE — ED Provider Notes (Signed)
History  This chart was scribed for Hilario Quarry, MD by Manuela Schwartz, ED scribe. This patient was seen in room APA02/APA02 and the patient's care was started at 2017.  CSN: [id number]  Arrival date & time 05/13/12  2017   First MD Initiated Contact with Patient 05/13/12 2032     Chief Complaint  Patient presents with  . Shoulder Pain   Patient is a 46 y.o. female presenting with shoulder injury. The history is provided by the patient. No language interpreter was used.  Shoulder Injury This is a new problem. The current episode started 1 to 2 hours ago. The problem occurs constantly. The problem has not changed since onset.Pertinent negatives include no chest pain, no headaches and no shortness of breath. The symptoms are aggravated by bending. Nothing relieves the symptoms. She has tried nothing for the symptoms.   [name] is a 46 y.o. female who presents to the Emergency Department complaining of right shoulder pain after she fell this PM while in the kitchen putting something in the refrigerator and injured her right shoulder. She states pain of her right shoulder and unable to move it secondary to pain. She denies hitting her head or LOC. She denies any previous injuries to her right shoulder. She is a smoker.  Past Medical History  Diagnosis Date  . Thyroid disease   . Hypertension   . Hyperlipidemia     Past Surgical History  Procedure Date  . Tubal ligation   . Ankle surgery     Family History  Problem Relation Age of Onset  . Cancer Mother   . Heart failure Father     History  Substance Use Topics  . Smoking status: Current Every Day Smoker -- 0.5 packs/day for 30 years    Types: Cigarettes  . Smokeless tobacco: Never Used  . Alcohol Use: No    OB History    Grav Para Term Preterm Abortions TAB SAB Ect Mult Living   2 2 2       2       Review of Systems  Constitutional: Negative for fever and chills.  HENT: Negative for congestion.   Respiratory:  Negative for shortness of breath.   Cardiovascular: Negative for chest pain.  Gastrointestinal: Negative for nausea and vomiting.  Musculoskeletal: Negative for back pain.       Right shoulder pain  Skin: Negative for pallor.  Neurological: Negative for syncope, weakness, numbness and headaches.  All other systems reviewed and are negative.    Allergies  Review of patient's allergies indicates no known allergies.  Home Medications   Current Outpatient Rx  Name  Route  Sig  Dispense  Refill  . ALBUTEROL SULFATE (2.5 MG/3ML) 0.083% IN NEBU   Nebulization   Take 2.5 mg by nebulization every 6 (six) hours as needed. For shortness of breath         . ALBUTEROL SULFATE HFA 108 (90 BASE) MCG/ACT IN AERS   Inhalation   Inhale 2 puffs into the lungs every 6 (six) hours as needed. FOR WHEEZING/SHORTNESS OF BREATH         . ASPIRIN EC 81 MG PO TBEC   Oral   Take 81 mg by mouth daily.         . FENOFIBRATE MICRONIZED 130 MG PO CAPS   Oral   Take 130 mg by mouth daily.         Marland Kitchen LEVOTHYROXINE SODIUM 200 MCG PO TABS   Oral  Take 200 mcg by mouth daily.         Marland Kitchen LOSARTAN POTASSIUM-HCTZ 100-25 MG PO TABS   Oral   Take 1 tablet by mouth daily.           Triage Vitals: BP 149/85  Pulse 82  Temp 97.8 F (36.6 C) (Oral)  Resp 20  Ht 5\' 5"  (1.651 m)  Wt 270 lb (122.471 kg)  BMI 44.93 kg/m2  SpO2 93%  LMP 05/08/2012  Physical Exam  Nursing note and vitals reviewed. Constitutional: She is oriented to person, place, and time. She appears well-developed and well-nourished. No distress.       Morbidly obese  HENT:  Head: Normocephalic and atraumatic.  Eyes: EOM are normal. Pupils are equal, round, and reactive to light.       Proptotic eyes.  Neck: Neck supple. No tracheal deviation present.  Cardiovascular: Normal rate.   Pulmonary/Chest: Effort normal. No respiratory distress.  Abdominal: Soft. Bowel sounds are normal.  Musculoskeletal:       Right shoulder  is tender and is held in abduction. Her radial pulse is intact. She is nontender distal to the injury. She has full range of motion of her right elbow right wrist and right hand. Sensation is intact throughout her right upper extremity. The clavicle is nontender. Her cervical spine and thoracic spine are nontender to palpation.  Right ankle is tender.  Neurological: She is alert and oriented to person, place, and time.  Skin: Skin is warm and dry.  Psychiatric: She has a normal mood and affect. Her behavior is normal.    ED Course  Procedures (including critical care time) DIAGNOSTIC STUDIES: Oxygen Saturation is 93% on room air, normal by my interpretation.    COORDINATION OF CARE: At 830 PM Discussed treatment plan with patient which includes pain medicine. Patient agrees.   At 950 PM I discussed follow up wit  Labs Reviewed - No data to display Dg Shoulder Right  05/13/2012  *RADIOLOGY REPORT*  Clinical Data: Right shoulder pain secondary to a fall.  RIGHT SHOULDER - 2+ VIEW  Comparison: None.  Findings: There is a comminuted fracture of the humeral head with the slight displacement of the fragments.  The humeral head is not dislocated.  Minimal degenerative changes of the [facility] joint.  IMPRESSION: Comminuted displaced fracture of the right humeral head.   Original Report Authenticated By: Francene Boyers, M.D.      No diagnosis found.    MDM  Patient is having sling placed. I discussed the tear of her shoulder fracture with her. She voices understanding. She'll call Dr. Mort Sawyers office in the morning to followup. She is given prescription for Percocet and Zofran.  I personally performed the services described in this documentation, which was scribed in my presence. The recorded information has been reviewed and is accurate.         Hilario Quarry, MD 05/13/12 2214

## 2012-05-13 NOTE — ED Notes (Signed)
Dr Rosalia Hammers at bedside requesting clothes to be removed. Patient requested that we cut off her shirt and bra. Patient tolerated well.

## 2012-05-13 NOTE — ED Notes (Signed)
Right shoulder pain, fell an hour ago

## 2012-05-17 ENCOUNTER — Telehealth: Payer: Self-pay | Admitting: Orthopedic Surgery

## 2012-05-17 NOTE — Telephone Encounter (Signed)
I offered [name] an appointment with Dr. Romeo Apple 05/18/12 for her follow-up from the ER. She said she had a lot going on now And cannot take that appointment, she will call us back when she can come in. I reached her at [phone]

## 2012-06-26 ENCOUNTER — Emergency Department (HOSPITAL_COMMUNITY): Payer: Self-pay

## 2012-06-26 ENCOUNTER — Encounter (HOSPITAL_COMMUNITY): Payer: Self-pay | Admitting: *Deleted

## 2012-06-26 ENCOUNTER — Emergency Department (HOSPITAL_COMMUNITY)
Admission: EM | Admit: 2012-06-26 | Discharge: 2012-06-26 | Disposition: A | Discharge status: Home / Self Care | Payer: Self-pay | Attending: Emergency Medicine | Admitting: Emergency Medicine

## 2012-06-26 DIAGNOSIS — L02419 Cutaneous abscess of limb, unspecified: Secondary | ICD-10-CM | POA: Insufficient documentation

## 2012-06-26 DIAGNOSIS — L03115 Cellulitis of right lower limb: Secondary | ICD-10-CM

## 2012-06-26 DIAGNOSIS — I10 Essential (primary) hypertension: Secondary | ICD-10-CM | POA: Insufficient documentation

## 2012-06-26 DIAGNOSIS — R112 Nausea with vomiting, unspecified: Secondary | ICD-10-CM | POA: Insufficient documentation

## 2012-06-26 DIAGNOSIS — R609 Edema, unspecified: Secondary | ICD-10-CM | POA: Insufficient documentation

## 2012-06-26 DIAGNOSIS — E785 Hyperlipidemia, unspecified: Secondary | ICD-10-CM | POA: Insufficient documentation

## 2012-06-26 DIAGNOSIS — Z79899 Other long term (current) drug therapy: Secondary | ICD-10-CM | POA: Insufficient documentation

## 2012-06-26 DIAGNOSIS — F17200 Nicotine dependence, unspecified, uncomplicated: Secondary | ICD-10-CM | POA: Insufficient documentation

## 2012-06-26 DIAGNOSIS — L03119 Cellulitis of unspecified part of limb: Secondary | ICD-10-CM | POA: Insufficient documentation

## 2012-06-26 DIAGNOSIS — R509 Fever, unspecified: Secondary | ICD-10-CM | POA: Insufficient documentation

## 2012-06-26 DIAGNOSIS — Z7982 Long term (current) use of aspirin: Secondary | ICD-10-CM | POA: Insufficient documentation

## 2012-06-26 DIAGNOSIS — M25473 Effusion, unspecified ankle: Secondary | ICD-10-CM | POA: Insufficient documentation

## 2012-06-26 DIAGNOSIS — M25579 Pain in unspecified ankle and joints of unspecified foot: Secondary | ICD-10-CM | POA: Insufficient documentation

## 2012-06-26 DIAGNOSIS — M25476 Effusion, unspecified foot: Secondary | ICD-10-CM | POA: Insufficient documentation

## 2012-06-26 DIAGNOSIS — E079 Disorder of thyroid, unspecified: Secondary | ICD-10-CM | POA: Insufficient documentation

## 2012-06-26 LAB — BASIC METABOLIC PANEL
Chloride: 95 mEq/L — ABNORMAL LOW (ref 96–112)
GFR calc Af Amer: >90 mL/min (ref >90)
GFR calc non Af Amer: >90 mL/min (ref >90)
Glucose, Bld: 168 mg/dL — ABNORMAL HIGH (ref 70–99)
Potassium: 3.8 mEq/L (ref 3.5–5.1)
Sodium: 136 mEq/L (ref 135–145)

## 2012-06-26 LAB — CBC WITH DIFFERENTIAL/PLATELET
Eosinophils Absolute: 0.3 10*3/uL (ref 0.0–0.7)
Lymphs Abs: 1.8 10*3/uL (ref 0.7–4.0)
MCH: 33.1 pg (ref 26.0–34.0)
Neutro Abs: 6.6 10*3/uL (ref 1.7–7.7)
Neutrophils Relative %: 71 % (ref 43–77)
Platelets: 230 10*3/uL (ref 150–400)
RBC: 4.05 MIL/uL (ref 3.87–5.11)
WBC: 9.3 10*3/uL (ref 4.0–10.5)

## 2012-06-26 MED ORDER — VANCOMYCIN HCL IN DEXTROSE 1-5 GM/200ML-% IV SOLN
1000.0000 mg | Freq: Once | INTRAVENOUS | Status: AC
  Administered 2012-06-26: 1000 mg via INTRAVENOUS
  Filled 2012-06-26: qty 200

## 2012-06-26 MED ORDER — SULFAMETHOXAZOLE-TRIMETHOPRIM 800-160 MG PO TABS: 1.0000 | ORAL_TABLET | Freq: Two times a day (BID) | ORAL | Status: DC

## 2012-06-26 NOTE — ED Provider Notes (Signed)
History  Scribed for Julie Hutching, MD, the patient was seen in room APA04/APA04. This chart was scribed by Candelaria Stagers. The patient's care started at 9:28 PM   CSN: [id number]  Arrival date & time 06/26/12  2109   First MD Initiated Contact with Patient 06/26/12 2123      Chief Complaint  Patient presents with  . Cellulitis     The history is provided by the patient. No language interpreter was used.   [name] is a 46 y.o. female who presents to the Emergency Department complaining of swelling, redness, and pain to the right ankle that started today.  Pt has also experienced fever, chills nausea, and vomiting that started about four days ago.  Nothing seems to make the sx better or worse.      Past Medical History  Diagnosis Date  . Thyroid disease   . Hypertension   . Hyperlipidemia     Past Surgical History  Procedure Date  . Tubal ligation   . Ankle surgery     Family History  Problem Relation Age of Onset  . Cancer Mother   . Heart failure Father     History  Substance Use Topics  . Smoking status: Current Every Day Smoker -- 0.5 packs/day for 30 years    Types: Cigarettes  . Smokeless tobacco: Never Used  . Alcohol Use: No    OB History    Grav Para Term Preterm Abortions TAB SAB Ect Mult Living   2 2 2       2       Review of Systems  Constitutional: Positive for fever and chills.  Gastrointestinal: Positive for nausea and vomiting.  Musculoskeletal:       Redness, swelling, and pain around right ankle  All other systems reviewed and are negative.    Allergies  Review of patient's allergies indicates no known allergies.  Home Medications   Current Outpatient Rx  Name  Route  Sig  Dispense  Refill  . ALBUTEROL SULFATE (2.5 MG/3ML) 0.083% IN NEBU   Nebulization   Take 2.5 mg by nebulization every 6 (six) hours as needed. For shortness of breath         . ALBUTEROL SULFATE HFA 108 (90 BASE) MCG/ACT IN AERS   Inhalation  Inhale 2 puffs into the lungs every 6 (six) hours as needed. FOR WHEEZING/SHORTNESS OF BREATH         . ASPIRIN EC 81 MG PO TBEC   Oral   Take 81 mg by mouth daily.         . FENOFIBRATE MICRONIZED 130 MG PO CAPS   Oral   Take 130 mg by mouth daily.         Marland Kitchen LEVOTHYROXINE SODIUM 200 MCG PO TABS   Oral   Take 200 mcg by mouth daily.         Marland Kitchen LOSARTAN POTASSIUM-HCTZ 100-25 MG PO TABS   Oral   Take 1 tablet by mouth daily.         Marland Kitchen ONDANSETRON 8 MG PO TBDP   Oral   Take 1 tablet (8 mg total) by mouth every 8 (eight) hours as needed for nausea.   20 tablet   0   . OXYCODONE-ACETAMINOPHEN 5-325 MG PO TABS   Oral   Take 1 tablet by mouth every 4 (four) hours as needed for pain.   20 tablet   0     BP 124/51  Pulse 87  Temp 98.5 F (36.9 C) (Oral)  Resp 18  Ht 5\' 4"  (1.626 m)  Wt 240 lb (108.863 kg)  BMI 41.20 kg/m2  SpO2 92%  LMP 06/05/2012  Physical Exam  Nursing note and vitals reviewed. Constitutional: She is oriented to person, place, and time. She appears well-developed and well-nourished. No distress.       Morbidly obese  HENT:  Head: Normocephalic and atraumatic.  Eyes: EOM are normal.  Neck: Normal range of motion. Neck supple.  Pulmonary/Chest: No respiratory distress.  Musculoskeletal: Normal range of motion. She exhibits edema.       Right lower extremity from the distal 40% of tibia on down skin is erythematous scaly and puffy, including foot.      Neurological: She is alert and oriented to person, place, and time.  Skin: Skin is warm and dry. She is not diaphoretic.  Psychiatric: She has a normal mood and affect. Her behavior is normal.    ED Course  Procedures   DIAGNOSTIC STUDIES: Oxygen Saturation is 92% on room air, normal by my interpretation.    COORDINATION OF CARE:  9:35 PM Will give IV antibiotics for skin infection.  Pt understands and agrees.  9:42 PM Ordered: DG Ankle Complete Right; CBC with Differential; Basic  metabolic panel  Labs Reviewed  BASIC METABOLIC PANEL - Abnormal; Notable for the following:    Chloride 95 (*)     CO2 33 (*)     Glucose, Bld 168 (*)     All other components within normal limits  CBC WITH DIFFERENTIAL   Dg Ankle Complete Right  06/26/2012  *RADIOLOGY REPORT*  Clinical Data: Right ankle inflammation and erythema.  RIGHT ANKLE - COMPLETE 3+ VIEW  Comparison: Right ankle radiographs performed 09/22/2006  Findings: There is no evidence of fracture or dislocation.  No osseous erosions are seen to suggest osteomyelitis.  Mild diffuse soft tissue swelling is noted about the distal lower leg and ankle.  A plate and screws are noted along the distal fibula, and screws are seen at the medial malleolus.  There is no evidence of loosening of hardware.  Mild degenerative change is seen about the interosseous space and along the tibial plafond.  Posterior and plantar calcaneal spurs are incidentally seen.  The ankle mortise is difficult to fully assess due to surrounding degenerative change.  The visualized joint spaces are otherwise preserved.  Scattered soft tissue calcifications noted at the lower leg.  IMPRESSION:  1.  No evidence of fracture or dislocation. 2.  No osseous erosions seen to suggest osteomyelitis. 3.  Mild diffuse soft tissue swelling noted about the distal lower leg and ankle; this could conceivably reflect the patient's baseline. 4.  Hardware appears intact; new degenerative change noted about the ankle joint, particularly about the ankle mortise and interosseous space.   Original Report Authenticated By: Tonia Ghent, M.D.      No diagnosis found.    MDM  History and physical consistent with cellulitis of right lower extremity.  IV vancomycin.   Plain films of right ankle show no osteomyelitis.  Discharge home on Septra DS for 10 days.  I personally performed the services described in this documentation, which was scribed in my presence. The recorded information  has been reviewed and is accurate.        Julie Hutching, MD 06/26/12 [phone]

## 2012-06-26 NOTE — ED Notes (Signed)
Pt c/o swelling and pain to right leg, ankle, and foot. From knee down, pts leg is reddish, purple.

## 2012-11-02 ENCOUNTER — Encounter (HOSPITAL_COMMUNITY): Payer: Self-pay

## 2012-11-02 ENCOUNTER — Emergency Department (HOSPITAL_COMMUNITY): Payer: Self-pay

## 2012-11-02 ENCOUNTER — Inpatient Hospital Stay (HOSPITAL_COMMUNITY)
Admission: EM | Admit: 2012-11-02 | Discharge: 2012-11-06 | DRG: 189 | Disposition: A | Discharge status: Home Health | Payer: MEDICAID | Attending: Internal Medicine | Admitting: Internal Medicine

## 2012-11-02 ENCOUNTER — Encounter (HOSPITAL_COMMUNITY): Payer: Self-pay | Admitting: *Deleted

## 2012-11-02 ENCOUNTER — Emergency Department (HOSPITAL_COMMUNITY)
Admission: EM | Admit: 2012-11-02 | Discharge: 2012-11-02 | Discharge status: Against Medical Advice | Payer: Self-pay | Attending: Emergency Medicine | Admitting: Emergency Medicine

## 2012-11-02 DIAGNOSIS — W010XXA Fall on same level from slipping, tripping and stumbling without subsequent striking against object, initial encounter: Secondary | ICD-10-CM | POA: Diagnosis present

## 2012-11-02 DIAGNOSIS — E662 Morbid (severe) obesity with alveolar hypoventilation: Secondary | ICD-10-CM | POA: Diagnosis present

## 2012-11-02 DIAGNOSIS — S82852A Displaced trimalleolar fracture of left lower leg, initial encounter for closed fracture: Secondary | ICD-10-CM

## 2012-11-02 DIAGNOSIS — Z79899 Other long term (current) drug therapy: Secondary | ICD-10-CM | POA: Insufficient documentation

## 2012-11-02 DIAGNOSIS — Z8639 Personal history of other endocrine, nutritional and metabolic disease: Secondary | ICD-10-CM | POA: Insufficient documentation

## 2012-11-02 DIAGNOSIS — I959 Hypotension, unspecified: Secondary | ICD-10-CM

## 2012-11-02 DIAGNOSIS — Z720 Tobacco use: Secondary | ICD-10-CM

## 2012-11-02 DIAGNOSIS — F17200 Nicotine dependence, unspecified, uncomplicated: Secondary | ICD-10-CM | POA: Diagnosis present

## 2012-11-02 DIAGNOSIS — F411 Generalized anxiety disorder: Secondary | ICD-10-CM | POA: Insufficient documentation

## 2012-11-02 DIAGNOSIS — Z809 Family history of malignant neoplasm, unspecified: Secondary | ICD-10-CM

## 2012-11-02 DIAGNOSIS — Z8249 Family history of ischemic heart disease and other diseases of the circulatory system: Secondary | ICD-10-CM

## 2012-11-02 DIAGNOSIS — I10 Essential (primary) hypertension: Secondary | ICD-10-CM | POA: Insufficient documentation

## 2012-11-02 DIAGNOSIS — R0902 Hypoxemia: Secondary | ICD-10-CM | POA: Diagnosis present

## 2012-11-02 DIAGNOSIS — Z7982 Long term (current) use of aspirin: Secondary | ICD-10-CM | POA: Insufficient documentation

## 2012-11-02 DIAGNOSIS — J9620 Acute and chronic respiratory failure, unspecified whether with hypoxia or hypercapnia: Principal | ICD-10-CM

## 2012-11-02 DIAGNOSIS — I872 Venous insufficiency (chronic) (peripheral): Secondary | ICD-10-CM | POA: Diagnosis present

## 2012-11-02 DIAGNOSIS — J449 Chronic obstructive pulmonary disease, unspecified: Secondary | ICD-10-CM | POA: Insufficient documentation

## 2012-11-02 DIAGNOSIS — Z862 Personal history of diseases of the blood and blood-forming organs and certain disorders involving the immune mechanism: Secondary | ICD-10-CM | POA: Insufficient documentation

## 2012-11-02 DIAGNOSIS — S82853A Displaced trimalleolar fracture of unspecified lower leg, initial encounter for closed fracture: Secondary | ICD-10-CM

## 2012-11-02 DIAGNOSIS — E872 Acidosis, unspecified: Secondary | ICD-10-CM | POA: Diagnosis present

## 2012-11-02 DIAGNOSIS — J4489 Other specified chronic obstructive pulmonary disease: Secondary | ICD-10-CM | POA: Insufficient documentation

## 2012-11-02 DIAGNOSIS — E039 Hypothyroidism, unspecified: Secondary | ICD-10-CM | POA: Diagnosis present

## 2012-11-02 DIAGNOSIS — S82853D Displaced trimalleolar fracture of unspecified lower leg, subsequent encounter for closed fracture with routine healing: Secondary | ICD-10-CM

## 2012-11-02 DIAGNOSIS — E079 Disorder of thyroid, unspecified: Secondary | ICD-10-CM | POA: Insufficient documentation

## 2012-11-02 DIAGNOSIS — Z6841 Body Mass Index (BMI) 40.0 and over, adult: Secondary | ICD-10-CM

## 2012-11-02 DIAGNOSIS — Y9301 Activity, walking, marching and hiking: Secondary | ICD-10-CM | POA: Insufficient documentation

## 2012-11-02 DIAGNOSIS — Y92009 Unspecified place in unspecified non-institutional (private) residence as the place of occurrence of the external cause: Secondary | ICD-10-CM

## 2012-11-02 DIAGNOSIS — E785 Hyperlipidemia, unspecified: Secondary | ICD-10-CM | POA: Diagnosis present

## 2012-11-02 DIAGNOSIS — Y9289 Other specified places as the place of occurrence of the external cause: Secondary | ICD-10-CM | POA: Insufficient documentation

## 2012-11-02 DIAGNOSIS — L97909 Non-pressure chronic ulcer of unspecified part of unspecified lower leg with unspecified severity: Secondary | ICD-10-CM | POA: Diagnosis present

## 2012-11-02 HISTORY — DX: Chronic obstructive pulmonary disease, unspecified: J44.9

## 2012-11-02 LAB — BASIC METABOLIC PANEL
BUN: 10 mg/dL (ref 6–23)
Chloride: 94 mEq/L — ABNORMAL LOW (ref 96–112)
GFR calc Af Amer: >90 mL/min (ref >90)
Potassium: 3.9 mEq/L (ref 3.5–5.1)

## 2012-11-02 LAB — CBC WITH DIFFERENTIAL/PLATELET
HCT: 45.8 % (ref 36.0–46.0)
Hemoglobin: 15.2 g/dL — ABNORMAL HIGH (ref 12.0–15.0)
Lymphocytes Relative: 15 % (ref 12–46)
Monocytes Absolute: 0.4 10*3/uL (ref 0.1–1.0)
Monocytes Relative: 5 % (ref 3–12)
Neutro Abs: 7.1 10*3/uL (ref 1.7–7.7)
WBC: 9 10*3/uL (ref 4.0–10.5)

## 2012-11-02 MED ORDER — SODIUM CHLORIDE 0.9 % IV SOLN
1000.0000 mL | Freq: Once | INTRAVENOUS | Status: AC
  Administered 2012-11-02: 1000 mL via INTRAVENOUS

## 2012-11-02 MED ORDER — HYDROCODONE-ACETAMINOPHEN 5-325 MG PO TABS: 1.0000 | ORAL_TABLET | ORAL | Status: DC | PRN

## 2012-11-02 MED ORDER — SODIUM CHLORIDE 0.9 % IV SOLN
1000.0000 mL | INTRAVENOUS | Status: DC
  Administered 2012-11-02: 1000 mL via INTRAVENOUS

## 2012-11-02 MED ORDER — FENTANYL CITRATE 0.05 MG/ML IJ SOLN
50.0000 ug | Freq: Once | INTRAMUSCULAR | Status: AC
  Administered 2012-11-02: 50 ug via INTRAVENOUS
  Filled 2012-11-02: qty 2

## 2012-11-02 NOTE — ED Notes (Signed)
Pt reports history of COPD.  WHen ems arrived, pt's room air 02 sat was 88%.  EMS put her on 2liters of 02 and sat increased to 93%.  Pt says doesn't wear 02 at home. Denies any cough or SOB.

## 2012-11-02 NOTE — ED Notes (Signed)
Called friend of Pt ([place])at [phone] to pick her up and bring her clothes.

## 2012-11-02 NOTE — ED Notes (Signed)
Pt back from x-ray.

## 2012-11-02 NOTE — ED Notes (Signed)
Attempted to call Judieth Keens per pt's request. Call was directed to voicemail.

## 2012-11-02 NOTE — ED Notes (Signed)
Informed by prior shift nurse of pt's AMA status and that pt is waiting on ride that should be here in roughly 30 minutes. Patient has been given discharge papers and prescriptions and has no complaints at this time.

## 2012-11-02 NOTE — ED Notes (Signed)
Judieth Keens will be here to pick Pt up in about 30 minutes.

## 2012-11-02 NOTE — ED Notes (Signed)
Pt reports has had wound to r lower leg for past several weeks.  Area is round and scabbed over.  Reports initially looked like a boil.  Pt says she squeezed it 2 weeks ago but area won't heal.  Pt has dark discoloration to bilateral lower extremities.  Pedal pulses present.  Left ankle swollen.  Pt can wiggle toes.  Foot warm to touch.

## 2012-11-02 NOTE — ED Notes (Signed)
Pt reports slipped on a towel going into her bathroom and c/o pain and swelling to left ankle.

## 2012-11-02 NOTE — ED Notes (Signed)
Pt given meal tray by patient advocate while pt is waiting for ride home.

## 2012-11-02 NOTE — ED Notes (Signed)
Pt's 02 sat 0n 2 liters decreased to 84% while sleeping.  Increased o2 to 4 liters and sat increased to 94%.  Pt says has history of sleep apnea but no longer has the machine.  Leona Singleton PA aware.

## 2012-11-02 NOTE — ED Provider Notes (Signed)
This chart was scribed for Ward Givens, MD by Bennett Scrape, ED Scribe. This patient was seen in room APA03/APA03 and the patient's care was started at 10:50 AM.  HPI Comments: [name] is a 47 y.o. female who presents to the Emergency Department complaining of a slip and fall. Family member states that the pt was going into the bathroom when she slipped on a rug on the floor. Family states that the pt was unable to get up after the fall. He denies LOC stating that the pt called to him right after the fall. Pt reported to family member that she heard a "pop" at the time. She was not given any medications en route by EMS and family states that the pt is usually not as drowsy at baseline. Pt attributes this to medications given in the ED. Pt has no other injuries. Pt used to be on a cpap machine but last use was "years ago". He denies that the pt stops breathing during the night or snores loudly.  PE: CONSTITUIONAL:Morbidly obese, hard to keep awake PULMONARY: sonorus respirations EYES: eyes are dysconjugate SKIN: lower leg has chronic skin changes with thickening and redness of the skin, mild increased swelling around the left ankle   10:52 AM- Informed pt and pt's family of radiology results. Discussed plan to consult Ortho to determine treatment plan and pt agreed.   Medical screening examination/treatment/procedure(s) were conducted as a shared visit with non-physician practitioner(s) and myself.  I personally evaluated the patient during the encounter  Devoria Albe, MD, FACEP   I personally performed the services described in this documentation, which was scribed in my presence. The recorded information has been reviewed and considered.  Devoria Albe, MD, FACEP   Ward Givens, MD 11/02/12 1330

## 2012-11-02 NOTE — ED Provider Notes (Signed)
History     CSN: [id number]  Arrival date & time 11/02/12  7829   First MD Initiated Contact with Patient 11/02/12 [phone]      Chief Complaint  Patient presents with  . Ankle Pain    (Consider location/radiation/quality/duration/timing/severity/associated sxs/prior treatment) HPI Comments: [name] is a 47 y.o. Female presenting with pain and injury to her left ankle after slipping on a towel this morning walking in her bathroom.  She's been unable to bear weight secondary to pain and swelling.  She denies other injury, including hitting her head, denies leg hip and back pain, no neck pain.  She has had no LOC.  She was unable to get off of her floor, became very upset when EMS had to carry her out of her house.  She reports becoming very anxious and short of breath and was noted by EMS to desaturate to the low 80s.  She denies history of chronic or intermittent shortness of breath, although states she had COPD and sleep apnea years ago when she was morbidly obese, she lost weight and these problems resolved spontaneously about 8 years ago.  However, more recently has gained a lot of weight in, but is not currently feeling treated for sleep apnea.  She does use albuterol about once daily for wheezing.  She denies chest pain, denies coughing, denies peripheral edema.  She has sensation in her left foot and toes.     The history is provided by the patient.    Past Medical History  Diagnosis Date  . Thyroid disease   . Hypertension   . Hyperlipidemia   . COPD (chronic obstructive pulmonary disease)     Past Surgical History  Procedure Laterality Date  . Tubal ligation    . Ankle surgery      Family History  Problem Relation Age of Onset  . Cancer Mother   . Heart failure Father     History  Substance Use Topics  . Smoking status: Current Every Day Smoker -- 0.50 packs/day for 30 years    Types: Cigarettes  . Smokeless tobacco: Never Used  . Alcohol Use: No    OB  History   Grav Para Term Preterm Abortions TAB SAB Ect Mult Living   2 2 2       2       Review of Systems  Respiratory: Negative for cough and shortness of breath.   Cardiovascular: Negative for chest pain and leg swelling.  Musculoskeletal: Positive for joint swelling and arthralgias.  Skin: Positive for wound.  Neurological: Negative for weakness and numbness.  Psychiatric/Behavioral: The patient is nervous/anxious.     Allergies  Review of patient's allergies indicates no known allergies.  Home Medications   Current Outpatient Rx  Name  Route  Sig  Dispense  Refill  . aspirin EC 81 MG tablet   Oral   Take 81 mg by mouth daily.         . fenofibrate micronized (ANTARA) 130 MG capsule   Oral   Take 130 mg by mouth daily.         Marland Kitchen levothyroxine (SYNTHROID, LEVOTHROID) 200 MCG tablet   Oral   Take 200 mcg by mouth daily.         Marland Kitchen losartan-hydrochlorothiazide (HYZAAR) 100-25 MG per tablet   Oral   Take 1 tablet by mouth daily.         Marland Kitchen HYDROcodone-acetaminophen (NORCO/VICODIN) 5-325 MG per tablet   Oral   Take  1 tablet by mouth every 4 (four) hours as needed for pain.   15 tablet   0     BP 114/37  Pulse 72  Temp(Src) 97.7 F (36.5 C) (Oral)  Resp 22  Ht 5\' 5"  (1.651 m)  Wt 250 lb (113.399 kg)  BMI 41.6 kg/m2  SpO2 94%  LMP 10/28/2012  Physical Exam  Constitutional: She appears well-developed and well-nourished.  HENT:  Head: Atraumatic.  Neck: Normal range of motion.  Cardiovascular:  Pulses equal bilaterally  Musculoskeletal: She exhibits edema and tenderness.       Left ankle: She exhibits decreased range of motion and swelling. She exhibits normal pulse. Tenderness. Lateral malleolus tenderness found. No head of 5th metatarsal and no proximal fibula tenderness found. Achilles tendon normal.  Tender across left anterior proximal foot.  Neurological: She is alert. She has normal strength. She displays normal reflexes. No sensory deficit.   Equal strength  Skin: Skin is warm and dry.  Anterior tibia bilateral with darkened, indurated skin, right greater than left,  Shiny,  Appearance of chronic venous insufficiency.  There is a scab right mid tibia with no surrounding edema,  Fluctuance or drainage.  Psychiatric: She has a normal mood and affect.    ED Course  Procedures (including critical care time)  Labs Reviewed  BASIC METABOLIC PANEL - Abnormal; Notable for the following:    Chloride 94 (*)    CO2 36 (*)    Glucose, Bld 143 (*)    All other components within normal limits  CBC WITH DIFFERENTIAL - Abnormal; Notable for the following:    Hemoglobin 15.2 (*)    Neutrophils Relative 78 (*)    All other components within normal limits   Dg Ankle Complete Left  11/02/2012  *RADIOLOGY REPORT*  Clinical Data: Left ankle pain post fall this morning  LEFT ANKLE COMPLETE - 3+ VIEW  Comparison: None  Findings: Marked soft tissue swelling. Widening of medial ankle mortise. Oblique medial malleolar fracture, nondisplaced. Oblique lateral malleolar fracture, mildly displaced posteriorly and medially. Minimally displaced posterior malleolar fracture fragment. No dislocation or bone destruction. Large plantar calcaneal spur. Numerous soft tissue calcifications within distal lower leg.  IMPRESSION: Trimalleolar fractures left ankle.   Original Report Authenticated By: Ulyses Southward, M.D.    Dg Chest Port 1 View  11/02/2012  *RADIOLOGY REPORT*  Clinical Data: Shortness of breath  PORTABLE CHEST - 1 VIEW  Comparison: 09/18/2006  Findings: Cardiomediastinal silhouette is stable.  Mild perihilar increased interstitial markings suspicious for mild interstitial edema.  No focal infiltrate.  IMPRESSION: Mild bilateral perihilar interstitial prominence suspicious for mild edema.  No segmental infiltrate.   Original Report Authenticated By: Natasha Mead, M.D.      1. Trimalleolar fracture of ankle, closed, left, initial encounter   2. Hypoxia        MDM  Spoke with Dr Hilda Lias who recommends posterior splint who will follow while admitted.  Will plan medical admission due to hypoxia.  Pt remains drowsy in ed.  She wakes easily to voice.  She states she has not slept well this week with worrying about home finances, etc.  She becomes very tearful at times during interview.  She denies depression, suicidal ideation,  States she just worries a lot.   She desats down to the mid to low 80's off oxygen while here Yet denies sob.    With hypoxia,  Suspect return of sleep apnea,  Possible pickwickian syndrome.  She is not wheezing at  baseline,  Is retaining co2 per bmet.  cxr neg for infiltrate.   Discussed these findings with patient who refuses to be admitted today.  Discussed the possible dangers of low oxygen levels including heart strain,  Confusion, death.  States she has home oxygen at home she can use if needed.  Also states she has the use of a motorized wheelchair if needed to maintain nonweightbearing status, and much prefers to f/u with Dr. Hilda Lias as an outpatient  Call placed to Dr. Hilda Lias again to advise of pts plan. He will see her in his office tomorrow or the next.  Injury is not one currently requiring surgery,  But will if she bears weight.  This was strongly stressed with the patient.  She was prescribed a walker as well.  Posterior splint with stirrups placed by RN,  Examined post application - she can wiggle her toes, less than 3 sec cap refill.   Patient has left AMA.            Burgess Amor, PA-C 11/02/12 1640

## 2012-11-02 NOTE — ED Notes (Signed)
Pt back to department.  Pt was seen and treated previously for fractured ankle.  Injury has been treated, splinted, and follow up scheduled with orthopedic provider.  It was recommended pt be admitted, but pt refused admission.  Pt has returned to department stating that she has changed her mind about being admitted because she isn't able to get into her house. It was explained to pt that due to her previous refusal and being discharged AMA, provided that nothing has changed and she remains medically stable, it's possible that there may not be an accepting physician to admit.  Encouraged pt to develop plan for that possibility. Pt verbalized understanding.

## 2012-11-02 NOTE — ED Provider Notes (Signed)
History  This chart was scribed for Glynn Octave, MD by Shari Heritage, ED Scribe. The patient was seen in room APA07/APA07. Patient's care was started at 2338.   CSN: [id number]  Arrival date & time 11/02/12  2332   First MD Initiated Contact with Patient 11/02/12 2338      Chief Complaint  Patient presents with  . Ankle Injury     The history is provided by the patient. No language interpreter was used.    HPI Comments: [name] is a 47 y.o. female with history of hypertension, COPD, hyperlipidemia and thyroid disease who presents to the Emergency Department for the second time today complaining of severe, constant ankle pain and difficulty ambulating secondary to an ankle injury that occurred earlier today. Patient slipped on a towel this morning while walking around her home and is unable to bear weight secondary to pain and swelling. She was seen for this problem earlier today and X-rays showed trimalleolar fractures to the left ankle. Dr. Hilda Lias agreed to see patient today or tomorrow in his office for follow up. He recommended a posterior splint placement. Patient has returned to the ED this evening because she is unable to enter her apartment with her walker due to her injury. Patient also had low O2 sats during this visit. Patient left the ED AMA before admission could be arranged. O2 sats are currently 80-81%. Patient states that she had sleep apnea several years ago and was on continuous oxygen, but she isn't at this time.    Past Medical History  Diagnosis Date  . Thyroid disease   . Hypertension   . Hyperlipidemia   . COPD (chronic obstructive pulmonary disease)     Past Surgical History  Procedure Laterality Date  . Tubal ligation    . Ankle surgery      Family History  Problem Relation Age of Onset  . Cancer Mother   . Heart failure Father     History  Substance Use Topics  . Smoking status: Current Every Day Smoker -- 0.50 packs/day for 30 years   Types: Cigarettes  . Smokeless tobacco: Never Used  . Alcohol Use: No    OB History   Grav Para Term Preterm Abortions TAB SAB Ect Mult Living   2 2 2       2       Review of Systems A complete 10 system review of systems was obtained and all systems are negative except as noted in the HPI and PMH.    Allergies  Review of patient's allergies indicates no known allergies.  Home Medications   Current Outpatient Rx  Name  Route  Sig  Dispense  Refill  . aspirin EC 81 MG tablet   Oral   Take 81 mg by mouth daily.         . fenofibrate micronized (ANTARA) 130 MG capsule   Oral   Take 130 mg by mouth daily.         Marland Kitchen HYDROcodone-acetaminophen (NORCO/VICODIN) 5-325 MG per tablet   Oral   Take 1 tablet by mouth every 4 (four) hours as needed for pain.   15 tablet   0   . levothyroxine (SYNTHROID, LEVOTHROID) 200 MCG tablet   Oral   Take 200 mcg by mouth daily.         Marland Kitchen losartan-hydrochlorothiazide (HYZAAR) 100-25 MG per tablet   Oral   Take 1 tablet by mouth daily.  BP 107/55  Pulse 68  Temp(Src) 97.4 F (36.3 C) (Oral)  Resp 21  Ht 5\' 5"  (1.651 m)  Wt 300 lb (136.079 kg)  BMI 49.92 kg/m2  SpO2 87%  LMP 10/28/2012  Physical Exam  Constitutional: She is oriented to person, place, and time. She appears well-developed and well-nourished.  Morbidly obese.  HENT:  Head: Normocephalic and atraumatic.  Eyes: Conjunctivae and EOM are normal. Pupils are equal, round, and reactive to light.  Neck: Normal range of motion. Neck supple.  Cardiovascular: Normal rate, regular rhythm and normal heart sounds.   Pulmonary/Chest: Tachypnea (mild) noted. She has decreased breath sounds (throughout). She has no wheezes.  Musculoskeletal:  Dark, ecchymotic skin to right shin with scabbed over lesion. Splint in place on left ankle. Capillary refill and toe wiggle intact.  Neurological: She is alert and oriented to person, place, and time.  Skin: Skin is warm and  dry.    ED Course  Procedures (including critical care time)  COORDINATION OF CARE: 11:48 PM- Patient informed of current plan for treatment and evaluation and agrees with plan at this time.      Labs Reviewed  COMPREHENSIVE METABOLIC PANEL - Abnormal; Notable for the following:    Chloride 94 (*)    CO2 33 (*)    Glucose, Bld 140 (*)    All other components within normal limits  BLOOD GAS, ARTERIAL - Abnormal; Notable for the following:    pCO2 arterial 56.5 (*)    pO2, Arterial 75.6 (*)    Bicarbonate 32.7 (*)    Acid-Base Excess 7.6 (*)    All other components within normal limits  D-DIMER, QUANTITATIVE - Abnormal; Notable for the following:    D-Dimer, Quant 2.20 (*)    All other components within normal limits  CBC WITH DIFFERENTIAL  TROPONIN I   Dg Ankle Complete Left  11/02/2012  *RADIOLOGY REPORT*  Clinical Data: Left ankle pain post fall this morning  LEFT ANKLE COMPLETE - 3+ VIEW  Comparison: None  Findings: Marked soft tissue swelling. Widening of medial ankle mortise. Oblique medial malleolar fracture, nondisplaced. Oblique lateral malleolar fracture, mildly displaced posteriorly and medially. Minimally displaced posterior malleolar fracture fragment. No dislocation or bone destruction. Large plantar calcaneal spur. Numerous soft tissue calcifications within distal lower leg.  IMPRESSION: Trimalleolar fractures left ankle.   Original Report Authenticated By: Ulyses Southward, M.D.    Ct Angio Chest Pe W/cm &/or Wo Cm  11/03/2012  *RADIOLOGY REPORT*  Clinical Data: 47 year old female with shortness of breath, hypoxia.  Recent ankle fracture.  CT ANGIOGRAPHY CHEST  Technique:  Multidetector CT imaging of the chest using the standard protocol during bolus administration of intravenous contrast. Multiplanar reconstructed images including MIPs were obtained and reviewed to evaluate the vascular anatomy.  Contrast: OMNIPAQUE IOHEXOL 350 MG/ML SOLN  Comparison: 09/18/2006.   Findings: Suboptimal contrast bolus timing in the pulmonary arterial tree.  No central pulmonary artery filling defect.  Motion artifact at the lung bases.  No convincing pulmonary artery filling defect identified to suggest acute pulmonary embolus.  Increased mediastinal lipomatosis.  Mild cardiomegaly.  No pericardial effusion.  Atherosclerosis of the aortic arch and great vessel origins, otherwise negative visualized aorta.  Stable mediastinal lymph nodes.  Negative visualized thoracic inlet.  Major airways are patent.  Chronic central lobular upper lobe emphysema, moderate to severe.  Dependent pulmonary atelectasis. No consolidation. No pleural effusions.  Chronic posterior rib fractures more numerous on the left. No acute osseous abnormality identified.  Cholelithiasis with multiple rim calcified dependent gallstones. Decreased density throughout the visualized liver compatible with steatosis.  Negative visualized spleen and pancreas.  There is a large low-density (18 HU) rim calcified lesion occupying the area of the right adrenal gland, measuring up to 67 mm diameter. This is not included on the prior study.  This does not appear to be associated with the right kidney. No normal right adrenal gland is visible.  The left adrenal gland is normal.  No upper abdominal lymphadenopathy.  IMPRESSION: 1.  Suboptimal contrast bolus timing but no convincing evidence of acute pulmonary embolus. 2.  Emphysema with lung base atelectasis. 3. Cystic rim calcified mass occupying the area of the right adrenal gland.  Favor sequelae of prior adrenal hemorrhage.  Other etiologies (adrenal neoplasm) less likely. 4.  Hepatic steatosis.  Cholelithiasis.   Original Report Authenticated By: Erskine Speed, M.D.    Dg Chest Portable 1 View  11/03/2012  *RADIOLOGY REPORT*  Clinical Data: 47 year old female hypoxemia.  Ankle injury.  PORTABLE CHEST - 1 VIEW  Comparison: 11/02/2012 and earlier.  Findings: Portable upright AP view 1125  hours.  Stable lung volumes.  Stable perihilar and basilar linear opacity.  Interval decrease vascular congestion.  No pneumothorax, pleural effusion or acute pulmonary opacity identified.  Stable cardiac size and mediastinal contours.  Visualized tracheal air column is within normal limits.  IMPRESSION: Interval resolved vascular congestion.  Mild bibasilar streaky opacity felt related to chronic lung disease. No superimposed acute findings are identified.   Original Report Authenticated By: Erskine Speed, M.D.    Dg Chest Port 1 View  11/02/2012  *RADIOLOGY REPORT*  Clinical Data: Shortness of breath  PORTABLE CHEST - 1 VIEW  Comparison: 09/18/2006  Findings: Cardiomediastinal silhouette is stable.  Mild perihilar increased interstitial markings suspicious for mild interstitial edema.  No focal infiltrate.  IMPRESSION: Mild bilateral perihilar interstitial prominence suspicious for mild edema.  No segmental infiltrate.   Original Report Authenticated By: Natasha Mead, M.D.      1. Hypoxia   2. Obesity hypoventilation syndrome   3. Trimalleolar fracture of ankle, closed, left, initial encounter       MDM  Seen earlier today after mechanical fall and found to have ankle fracture. Medical admission for hypoxia recommended but the patient refused. She returns with worsening pain and states she cannot get into her house. Oxygen saturation in the 70s on arrival with mild tachypnea. Clear lungs.  Chest x-ray negative. ABG shows mild respiratory acidosis. D-dimer elevated. Suspect patient's hypoxia secondary to obesity hypoventilation syndrome and obstructive sleep apnea.  No evidence of PE on CT.  O2 saturation in mid 90s on 4L. No distress.   Date: 11/02/2012  Rate: 67  Rhythm: normal sinus rhythm  QRS Axis: normal  Intervals: normal  ST/T Wave abnormalities: normal  Conduction Disutrbances:none  Narrative Interpretation:   Old EKG Reviewed: unchanged      I personally performed the  services described in this documentation, which was scribed in my presence. The recorded information has been reviewed and is accurate.    Glynn Octave, MD 11/03/12 [phone]

## 2012-11-03 ENCOUNTER — Emergency Department (HOSPITAL_COMMUNITY): Payer: Self-pay

## 2012-11-03 ENCOUNTER — Encounter (HOSPITAL_COMMUNITY): Payer: Self-pay | Admitting: *Deleted

## 2012-11-03 DIAGNOSIS — E785 Hyperlipidemia, unspecified: Secondary | ICD-10-CM | POA: Diagnosis present

## 2012-11-03 DIAGNOSIS — S82853A Displaced trimalleolar fracture of unspecified lower leg, initial encounter for closed fracture: Secondary | ICD-10-CM

## 2012-11-03 DIAGNOSIS — Z720 Tobacco use: Secondary | ICD-10-CM

## 2012-11-03 DIAGNOSIS — R0902 Hypoxemia: Secondary | ICD-10-CM | POA: Diagnosis present

## 2012-11-03 DIAGNOSIS — E039 Hypothyroidism, unspecified: Secondary | ICD-10-CM | POA: Diagnosis present

## 2012-11-03 DIAGNOSIS — I10 Essential (primary) hypertension: Secondary | ICD-10-CM | POA: Diagnosis present

## 2012-11-03 DIAGNOSIS — E662 Morbid (severe) obesity with alveolar hypoventilation: Secondary | ICD-10-CM | POA: Diagnosis present

## 2012-11-03 LAB — CBC WITH DIFFERENTIAL/PLATELET
Basophils Absolute: 0 10*3/uL (ref 0.0–0.1)
Basophils Relative: 0 % (ref 0–1)
Eosinophils Absolute: 0.1 10*3/uL (ref 0.0–0.7)
Eosinophils Relative: 1 % (ref 0–5)
Lymphs Abs: 1.5 10*3/uL (ref 0.7–4.0)
MCH: 31.7 pg (ref 26.0–34.0)
MCHC: 32.8 g/dL (ref 30.0–36.0)
MCV: 96.6 fL (ref 78.0–100.0)
Neutrophils Relative %: 77 % (ref 43–77)
Platelets: 240 10*3/uL (ref 150–400)
RBC: 4.67 MIL/uL (ref 3.87–5.11)
RDW: 15.3 % (ref 11.5–15.5)

## 2012-11-03 LAB — BLOOD GAS, ARTERIAL
Bicarbonate: 32.7 mEq/L — ABNORMAL HIGH (ref 20.0–24.0)
Bicarbonate: 35.6 mEq/L — ABNORMAL HIGH (ref 20.0–24.0)
Delivery systems: POSITIVE
Inspiratory PAP: 20
O2 Saturation: 82.3 %
Patient temperature: 37
Patient temperature: 37
Patient temperature: 37
Pressure support: 12 cmH2O
TCO2: 28.7 mmol/L (ref 0–100)
pCO2 arterial: 56.5 mmHg — ABNORMAL HIGH (ref 35.0–45.0)
pCO2 arterial: 74.7 mmHg (ref 35.0–45.0)
pH, Arterial: 7.286 — ABNORMAL LOW (ref 7.350–7.450)
pH, Arterial: 7.291 — ABNORMAL LOW (ref 7.350–7.450)
pH, Arterial: 7.381 (ref 7.350–7.450)

## 2012-11-03 LAB — D-DIMER, QUANTITATIVE: D-Dimer, Quant: 2.2 ug/mL-FEU — ABNORMAL HIGH (ref 0.00–0.48)

## 2012-11-03 LAB — COMPREHENSIVE METABOLIC PANEL
ALT: 29 U/L (ref 0–35)
AST: 30 U/L (ref 0–37)
Albumin: 3.6 g/dL (ref 3.5–5.2)
Alkaline Phosphatase: 92 U/L (ref 39–117)
Calcium: 9.2 mg/dL (ref 8.4–10.5)
GFR calc Af Amer: >90 mL/min (ref >90)
Glucose, Bld: 140 mg/dL — ABNORMAL HIGH (ref 70–99)
Potassium: 3.9 mEq/L (ref 3.5–5.1)
Sodium: 135 mEq/L (ref 135–145)
Total Protein: 7.6 g/dL (ref 6.0–8.3)

## 2012-11-03 LAB — HEMOGLOBIN A1C: Mean Plasma Glucose: 186 mg/dL — ABNORMAL HIGH (ref <117)

## 2012-11-03 LAB — BILIRUBIN, DIRECT: Bilirubin, Direct: 0.1 mg/dL (ref 0.0–0.3)

## 2012-11-03 MED ORDER — LEVOTHYROXINE SODIUM 100 MCG PO TABS
200.0000 ug | ORAL_TABLET | Freq: Every day | ORAL | Status: DC
  Administered 2012-11-03 – 2012-11-06 (×4): 200 ug via ORAL
  Filled 2012-11-03 (×4): qty 2

## 2012-11-03 MED ORDER — FENOFIBRATE 160 MG PO TABS
160.0000 mg | ORAL_TABLET | Freq: Every day | ORAL | Status: DC
  Administered 2012-11-03 – 2012-11-06 (×4): 160 mg via ORAL
  Filled 2012-11-03 (×7): qty 1

## 2012-11-03 MED ORDER — ENOXAPARIN SODIUM 40 MG/0.4ML ~~LOC~~ SOLN
40.0000 mg | SUBCUTANEOUS | Status: DC
  Administered 2012-11-03: 40 mg via SUBCUTANEOUS
  Filled 2012-11-03: qty 0.4

## 2012-11-03 MED ORDER — ONDANSETRON HCL 4 MG/2ML IJ SOLN: 4.0000 mg | Freq: Three times a day (TID) | INTRAMUSCULAR | Status: DC | PRN

## 2012-11-03 MED ORDER — ACETAMINOPHEN 325 MG PO TABS: 650.0000 mg | ORAL_TABLET | ORAL | Status: DC | PRN

## 2012-11-03 MED ORDER — FAMOTIDINE 20 MG PO TABS
20.0000 mg | ORAL_TABLET | Freq: Two times a day (BID) | ORAL | Status: DC
  Administered 2012-11-03 – 2012-11-06 (×7): 20 mg via ORAL
  Filled 2012-11-03 (×6): qty 1

## 2012-11-03 MED ORDER — SODIUM CHLORIDE 0.9 % IJ SOLN
3.0000 mL | Freq: Two times a day (BID) | INTRAMUSCULAR | Status: DC
  Administered 2012-11-03 – 2012-11-06 (×4): 3 mL via INTRAVENOUS

## 2012-11-03 MED ORDER — ONDANSETRON HCL 4 MG/2ML IJ SOLN: 4.0000 mg | INTRAMUSCULAR | Status: DC | PRN

## 2012-11-03 MED ORDER — ASPIRIN EC 81 MG PO TBEC
81.0000 mg | DELAYED_RELEASE_TABLET | Freq: Every day | ORAL | Status: DC
  Administered 2012-11-03 – 2012-11-06 (×4): 81 mg via ORAL
  Filled 2012-11-03 (×4): qty 1

## 2012-11-03 MED ORDER — LOSARTAN POTASSIUM-HCTZ 100-25 MG PO TABS: 1.0000 | ORAL_TABLET | Freq: Every day | ORAL | Status: DC

## 2012-11-03 MED ORDER — FLEET ENEMA 7-19 GM/118ML RE ENEM: 1.0000 | ENEMA | Freq: Once | RECTAL | Status: AC | PRN

## 2012-11-03 MED ORDER — SODIUM CHLORIDE 0.9 % IV SOLN
INTRAVENOUS | Status: AC
  Administered 2012-11-03: 04:00:00 via INTRAVENOUS

## 2012-11-03 MED ORDER — HYDROCHLOROTHIAZIDE 25 MG PO TABS
25.0000 mg | ORAL_TABLET | Freq: Every day | ORAL | Status: DC
  Administered 2012-11-03: 25 mg via ORAL
  Filled 2012-11-03: qty 1

## 2012-11-03 MED ORDER — BISACODYL 10 MG RE SUPP: 10.0000 mg | Freq: Every day | RECTAL | Status: DC | PRN

## 2012-11-03 MED ORDER — LOSARTAN POTASSIUM 50 MG PO TABS
100.0000 mg | ORAL_TABLET | Freq: Every day | ORAL | Status: DC
  Administered 2012-11-03: 100 mg via ORAL
  Filled 2012-11-03: qty 2

## 2012-11-03 MED ORDER — BIOTENE DRY MOUTH MT LIQD
15.0000 mL | Freq: Two times a day (BID) | OROMUCOSAL | Status: DC
  Administered 2012-11-03 – 2012-11-06 (×6): 15 mL via OROMUCOSAL

## 2012-11-03 MED ORDER — IOHEXOL 350 MG/ML SOLN
125.0000 mL | Freq: Once | INTRAVENOUS | Status: AC | PRN
  Administered 2012-11-03: 125 mL via INTRAVENOUS

## 2012-11-03 NOTE — Progress Notes (Signed)
Pt RECEIVED FROM 300 TO ICU AS STEPDOWN PT ON BIBAP.

## 2012-11-03 NOTE — Progress Notes (Signed)
UR chart review completed.  

## 2012-11-03 NOTE — Progress Notes (Signed)
Patient in ICU.  She is on BiPAP now and resting.  She has had a restless day and finally sleeping.  Nurses prefer I not wake her at this time. I have reviewed the x-rays and records to date.  She has a trimalleolar fracture of the left ankle, nondisplaced.  That is unusual.  It usually displaces and needs surgery.  She is in a posterior splint.   I have ordered x-rays for tomorrow morning.  I will see then  if the fracture has remained stable.    Keep splint in place.  Keep at bed rest for now.  I will do full consult note then.

## 2012-11-03 NOTE — Progress Notes (Signed)
Nutrition Brief Note  RD consulted for education due to morbid obesity.   Attempted education multiple times today, but pt either asleep or working with other health care providers at times of visits. Pt is now in the ICU, on Bipap.   Body mass index is 58.62 kg/(m^2). Patient meets criteria for extreme obesity, class III based on current BMI.   Current diet order is Heart Healthy, patient is consuming approximately n/a% of meals at this time. Labs and medications reviewed.   Will reattempt education once pt condition improves. If further nutrition issues arise, please consult RD.   Melody Haver, RD, LDN Pager: [phone]

## 2012-11-03 NOTE — ED Notes (Signed)
Patients boyfriend "Bethann Berkshire" gave his phone number to be called with patients room number [phone]

## 2012-11-03 NOTE — Evaluation (Signed)
Physical Therapy Evaluation Patient Details Name: [name] MRN: [id number] DOB: 1966-02-09 Today's Date: 11/03/2012 Time: 7829-5621 PT Time Calculation (min): 26 min  PT Assessment / Plan / Recommendation Clinical Impression  Pt is a 47 yo obese female who fell and fractured her L ankle yesterday.  Orthopedic has not yet been in to see pt.  Pt has acute pain, decreased strength and decreased mobility.  Pt will be followed by therapy for exercise, bed mobility and NWB transfers and ambulation until orthopedic consult as pt will loose functional strrength quickly in bed.      PT Assessment  Patient needs continued PT services    Follow Up Recommendations  Home health PT    Does the patient have the potential to tolerate intense rehabilitation    no  Barriers to Discharge  none      Equipment Recommendations  Rolling walker with 5" wheels    Recommendations for Other Services  none   Frequency 7X/week    Precautions / Restrictions Precautions Precautions: Fall Restrictions Weight Bearing Restrictions: Yes LLE Weight Bearing: Non weight bearing   Pertinent Vitals/Pain 9/10-repostitioned      Mobility  Bed Mobility Bed Mobility: Supine to Sit;Sit to Supine Supine to Sit: 6: Modified independent (Device/Increase time) Sit to Supine: 6: Modified independent (Device/Increase time) Transfers Transfers: Not assessed (lab to pt room with stat ABG)    Exercises  none at this time.   PT Diagnosis: Difficulty walking;Generalized weakness;Acute pain  PT Problem List: Decreased strength;Decreased activity tolerance;Decreased mobility;Obesity;Pain PT Treatment Interventions: Gait training;Therapeutic exercise;Therapeutic activities;Functional mobility training;Patient/family education   PT Goals Acute Rehab PT Goals PT Goal Formulation: With patient Time For Goal Achievement: 11/08/12 Potential to Achieve Goals: Good Pt will Sit at [facility] of Bed: Independently;3-5  min PT Goal: Sit at Edge Of Bed - Progress: Goal set today Pt will go Sit to Stand: with min assist PT Goal: Sit to Stand - Progress: Goal set today Pt will go Stand to Sit: with supervision PT Goal: Stand to Sit - Progress: Goal set today Pt will Transfer Bed to Chair/Chair to Bed: with min assist PT Transfer Goal: Bed to Chair/Chair to Bed - Progress: Goal set today Pt will Stand: with supervision;1 - 2 min PT Goal: Stand - Progress: Goal set today Pt will Ambulate: 16 - 50 feet;with min assist PT Goal: Ambulate - Progress: Goal set today  Visit Information  Last PT Received On: 11/03/12    Subjective Data  Subjective: Pt states that she tripped over a towel in her bathroom yesterday which caused her fall.  She fractrued her L ankle.  Pt states that a ramp is being built for her home.  She has not been seen by an orthopedic MD yet.   Patient Stated Goal: To go home.   Prior Functioning  Home Living Lives With: Significant other Available Help at Discharge: Friend(s) Type of Home: House Home Access: Stairs to enter (ramp being built) Secretary/administrator of Steps: 5 Entrance Stairs-Rails: Right Home Layout: One level Firefighter: Standard Home Adaptive Equipment: None Prior Function Level of Independence: Independent Able to Take Stairs?: Yes Driving: Yes Vocation: Unemployed Communication Communication: No difficulties (does demonstrate sleep apnea) Dominant Hand: Right    Cognition  Cognition Arousal/Alertness:  (Pt fell asleep 3 times while therapist was speaking to pt) Overall Cognitive Status: Within Functional Limits for tasks assessed    Extremity/Trunk Assessment Right Lower Extremity Assessment RLE ROM/Strength/Tone: Deficits RLE ROM/Strength/Tone Deficits: ROM wfl but  strength is decreased 3+/5 for hip; 4/5 for knee Left Lower Extremity Assessment LLE ROM/Strength/Tone:  (general strength 3/5)   Balance    End of Session PT - End of Session Patient  left: in bed;with call bell/phone within reach;with bed alarm set  GP     Jefferey Lippmann,CINDY 11/03/2012, 10:29 AM

## 2012-11-03 NOTE — Progress Notes (Signed)
OT Cancellation Note  Patient Details Name: [name] MRN: [id number] DOB: 10-May-1966   Cancelled Treatment:    Reason Eval/Treat Not Completed: Fatigue/lethargy limiting ability to participate. Patient sleeping and unable to stay away for OT eval. Will re-attempt when appropriate.   Limmie Patricia, OTR/L,CBIS   11/03/2012, 9:12 AM

## 2012-11-03 NOTE — ED Notes (Signed)
Boyfriend "Johnny" called and given room number.

## 2012-11-03 NOTE — Clinical Social Work Note (Signed)
CSW received referral for home oxygen and Bipap. CM notified. CSW signing off but can be reconsulted if needed.   Derenda Fennel, [place] [phone]

## 2012-11-03 NOTE — H&P (Signed)
[facility] History and Physical  [name]  XBM:[id number]  DOB: 05-31-1966   DOA: 11/03/2012   PCP:   Dwana Melena, MD   Chief Complaint:  Hypoxia  HPI: [name] is an 47 y.o. female.   Morbidly obese Caucasian lady, diagnosed with severe obstructive sleep apnea in 2007, placed on home BiPAP 23/10; she reports that she lost weight and after a couple years no longer needed the BiPAP. The records show that her weight at the time of the sleep study was 280 pounds.  She reports she began to gain weight again over the past 4 years after the death of her husband, and now has severe daytime somnolence.   She came to the emergency room earlier today because of mechanical fall and was found to have her try a malleolus fracture of the left ankle. She was noted to be markedly hypoxic but refused admission and left AMA. On arrival at home she found she could not negotiate entry steps into the home because of a fractured and return to the emergency room where the hospitalist service was called for admission.  Of interest she was admitted to the orthopedic service in March 2008 for trimalleolar fracture of the  Right and the hospitalist service was consulted to assist with management of her obesity hyperventilation syndrome. kle  Rewiew of Systems:   All systems negative except as marked bold or noted in the HPI;  Constitutional:    malaise, fever and chills. ;  Eyes:   eye pain, redness and discharge. 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 ;  Cardiovascular:    chest pain, palpitations, diaphoresis, dyspnea and peripheral edema.  Respiratory:   cough, hemoptysis, wheezing and stridor. ;  Gastrointestinal:  nausea, vomiting, diarrhea, constipation, abdominal pain, melena, blood in stool, hematemesis, jaundice and rectal bleeding. unusual weight loss..   Genitourinary:    frequency, dysuria,  incontinence,flank pain and hematuria; Musculoskeletal:   back pain and neck pain.  swelling and trauma.;  Skin: .  pruritus, rash, abrasions, bruising and skin lesion.; ulcerations Neuro:    headache, lightheadedness and neck stiffness.  weakness, altered level of consciousness, altered mental status, extremity weakness, burning feet, involuntary movement, seizure and syncope.  Psych:    anxiety, depression, insomnia, tearfulness, panic attacks, hallucinations, paranoia, suicidal or homicidal ideation    Past Medical History  Diagnosis Date  . Thyroid disease   . Hypertension   . Hyperlipidemia   . COPD (chronic obstructive pulmonary disease)     Past Surgical History  Procedure Laterality Date  . Tubal ligation    . Ankle surgery      Medications:  HOME MEDS: Prior to Admission medications   Medication Sig Start Date End Date Taking? Authorizing Provider  aspirin EC 81 MG tablet Take 81 mg by mouth daily.    Historical Provider, MD  fenofibrate micronized (ANTARA) 130 MG capsule Take 130 mg by mouth daily.    Historical Provider, MD  HYDROcodone-acetaminophen (NORCO/VICODIN) 5-325 MG per tablet Take 1 tablet by mouth every 4 (four) hours as needed for pain. 11/02/12   Burgess Amor, PA-C  levothyroxine (SYNTHROID, LEVOTHROID) 200 MCG tablet Take 200 mcg by mouth daily.    Historical Provider, MD  losartan-hydrochlorothiazide (HYZAAR) 100-25 MG per tablet Take 1 tablet by mouth daily.    Historical Provider, MD     Allergies:  No Known Allergies  Social History:  reports that she has been smoking Cigarettes.  She has a 30 pack-year smoking history. She uses smokeless tobacco. She reports that she does not drink alcohol or use illicit drugs.  Family History: Family History  Problem Relation Age of Onset  . Cancer Mother   . Heart failure Father      Physical Exam: Filed Vitals:   11/02/12 2340 11/03/12 0000 11/03/12 0217 11/03/12 0335  BP: 110/58  107/55 131/82  Pulse:  78 70 68 77  Temp: 97.8 F (36.6 C)  97.4 F (36.3 C) 97.3 F (36.3 C)  TempSrc: Oral  Oral   Resp: 22  21 24   Height: 5\' 5"  (1.651 m)   5\' 5"  (1.651 m)  Weight: 136.079 kg (300 lb)   159.8 kg (352 lb 4.7 oz)  SpO2: 88% 92% 87% 91%   Blood pressure 131/82, pulse 77, temperature 97.3 F (36.3 C), temperature source Oral, resp. rate 24, height 5\' 5"  (1.651 m), weight 159.8 kg (352 lb 4.7 oz), last menstrual period 10/28/2012, SpO2 91.00%.  GEN:  Pleasant but depressed-looking morbidly obese Caucasian lady lying bed in no acute distress; cooperative with exam, to the extent she can. PSYCH:  alert and oriented x4;   depressed; affect is appropriate. HEENT: Mucous membranes pink and anicteric; translucent cornea right eye EOM intact; weight neck with large double chin   Breasts:: Not examined CHEST WALL: No tenderness CHEST: Normal respiration, clear to auscultation bilaterally HEART: Regular rate and rhythm; no murmurs rubs or gallops BACK: No kyphosis no scoliosis; no CVA tenderness ABDOMEN: Obese, soft non-tender; no masses, no organomegaly, normal abdominal bowel sounds; large pannus extensive erythema in the right lower side of the pannus; a small area intertriginous rash, under the left abdominal Rectal Exam: Not done EXTREMITIES: ; age-appropriate arthropathy of the hands and knees; left leg in a partial cast; right leg shows venous stasis changes, with one and a half centimeter venous stasis ulcer. Genitalia: not examined PULSES: 2+ and symmetric SKIN: Normal hydration no rash or ulceration CNS: Cranial nerves 2-12 grossly intact no focal lateralizing neurologic deficit   Labs on Admission:  Basic Metabolic Panel:  Recent Labs Lab 11/02/12 0934 11/02/12 2344  NA 135 135  K 3.9 3.9  CL 94* 94*  CO2 36* 33*  GLUCOSE 143* 140*  BUN 10 13  CREATININE 0.54 0.68  CALCIUM 9.2 9.2   Liver Function Tests:  Recent Labs Lab 11/02/12 2344  AST 30  ALT 29  ALKPHOS 92   BILITOT 0.6  PROT 7.6  ALBUMIN 3.6   No results found for this basename: LIPASE, AMYLASE,  in the last 168 hours No results found for this basename: AMMONIA,  in the last 168 hours CBC:  Recent Labs Lab 11/02/12 0934 11/02/12 2344  WBC 9.0 9.9  NEUTROABS 7.1 7.6  HGB 15.2* 14.8  HCT 45.8 45.1  MCV 96.4 96.6  PLT 230 240   Cardiac Enzymes:  Recent Labs Lab 11/02/12 2344  TROPONINI <0.30   BNP: No components found with this basename: POCBNP,  D-dimer: No components found with this basename: D-DIMER,  CBG: No results found for this basename: GLUCAP,  in the last 168 hours  Radiological Exams on Admission: Dg Ankle Complete Left  11/02/2012  *RADIOLOGY REPORT*  Clinical Data: Left ankle pain post fall this morning  LEFT ANKLE COMPLETE - 3+ VIEW  Comparison: None  Findings: Marked soft tissue swelling. Widening of medial ankle mortise. Oblique medial malleolar fracture, nondisplaced. Oblique lateral malleolar  fracture, mildly displaced posteriorly and medially. Minimally displaced posterior malleolar fracture fragment. No dislocation or bone destruction. Large plantar calcaneal spur. Numerous soft tissue calcifications within distal lower leg.  IMPRESSION: Trimalleolar fractures left ankle.   Original Report Authenticated By: Ulyses Southward, M.D.    Ct Angio Chest Pe W/cm &/or Wo Cm  11/03/2012  *RADIOLOGY REPORT*  Clinical Data: 47 year old female with shortness of breath, hypoxia.  Recent ankle fracture.  CT ANGIOGRAPHY CHEST  Technique:  Multidetector CT imaging of the chest using the standard protocol during bolus administration of intravenous contrast. Multiplanar reconstructed images including MIPs were obtained and reviewed to evaluate the vascular anatomy.  Contrast: OMNIPAQUE IOHEXOL 350 MG/ML SOLN  Comparison: 09/18/2006.  Findings: Suboptimal contrast bolus timing in the pulmonary arterial tree.  No central pulmonary artery filling defect.  Motion artifact at the lung  bases.  No convincing pulmonary artery filling defect identified to suggest acute pulmonary embolus.  Increased mediastinal lipomatosis.  Mild cardiomegaly.  No pericardial effusion.  Atherosclerosis of the aortic arch and great vessel origins, otherwise negative visualized aorta.  Stable mediastinal lymph nodes.  Negative visualized thoracic inlet.  Major airways are patent.  Chronic central lobular upper lobe emphysema, moderate to severe.  Dependent pulmonary atelectasis. No consolidation. No pleural effusions.  Chronic posterior rib fractures more numerous on the left. No acute osseous abnormality identified.  Cholelithiasis with multiple rim calcified dependent gallstones. Decreased density throughout the visualized liver compatible with steatosis.  Negative visualized spleen and pancreas.  There is a large low-density (18 HU) rim calcified lesion occupying the area of the right adrenal gland, measuring up to 67 mm diameter. This is not included on the prior study.  This does not appear to be associated with the right kidney. No normal right adrenal gland is visible.  The left adrenal gland is normal.  No upper abdominal lymphadenopathy.  IMPRESSION: 1.  Suboptimal contrast bolus timing but no convincing evidence of acute pulmonary embolus. 2.  Emphysema with lung base atelectasis. 3. Cystic rim calcified mass occupying the area of the right adrenal gland.  Favor sequelae of prior adrenal hemorrhage.  Other etiologies (adrenal neoplasm) less likely. 4.  Hepatic steatosis.  Cholelithiasis.   Original Report Authenticated By: Erskine Speed, M.D.    Dg Chest Portable 1 View  11/03/2012  *RADIOLOGY REPORT*  Clinical Data: 47 year old female hypoxemia.  Ankle injury.  PORTABLE CHEST - 1 VIEW  Comparison: 11/02/2012 and earlier.  Findings: Portable upright AP view 1125 hours.  Stable lung volumes.  Stable perihilar and basilar linear opacity.  Interval decrease vascular congestion.  No pneumothorax, pleural effusion  or acute pulmonary opacity identified.  Stable cardiac size and mediastinal contours.  Visualized tracheal air column is within normal limits.  IMPRESSION: Interval resolved vascular congestion.  Mild bibasilar streaky opacity felt related to chronic lung disease. No superimposed acute findings are identified.   Original Report Authenticated By: Erskine Speed, M.D.    Dg Chest Port 1 View  11/02/2012  *RADIOLOGY REPORT*  Clinical Data: Shortness of breath  PORTABLE CHEST - 1 VIEW  Comparison: 09/18/2006  Findings: Cardiomediastinal silhouette is stable.  Mild perihilar increased interstitial markings suspicious for mild interstitial edema.  No focal infiltrate.  IMPRESSION: Mild bilateral perihilar interstitial prominence suspicious for mild edema.  No segmental infiltrate.   Original Report Authenticated By: Natasha Mead, M.D.     Assessment/Plan Present on Admission:  . Obesity hypoventilation syndrome . Hypoxia . Morbid obesity  . Other and  unspecified hyperlipidemia . Unspecified hypothyroidism   PLAN: We'll admit this lady for observation; oxygen therapy and consult the social worker to arrange home oxygen and a BiPAP machine Counseled on the importance of weight loss; she reports that her previous weight loss was secondary to her divorce.  Nutritional counseling  Check thyroid function and continue Synthroid  Consult orthopedics  Other plans as per orders.  Code Status: Full Family Communication: Discussed with patient at bedside  Disposition Plan: Depending on outcome of orthopedic evaluation   Haitham Dolinsky Nocturnist [facility] Pager [phone]   11/03/2012, 6:03 AM

## 2012-11-03 NOTE — Progress Notes (Signed)
[facility] PROGRESS NOTE  [name] ZOX:[id number] DOB: 11-29-1965 DOA: 11-07-2012 PCP: Dwana Melena, MD  Assessment/Plan: Obesity hypoventilation syndrome: Pt intermittently drowsy. Has not had pain med. Obvious snoring with some apnea. Will get ABG. Chest xray with moderate emphysema and CT chest without acute abnormalities, no evidence of pulmonary embolism. Continue oxygen support. Monitor closely  . Hypoxia: sats drop to 87% on 4L [place] when sleeping. When awake and stimulated sats up to 90%. See #1. Check ABG. Monitor.   . Morbid obesity: BMI 58.1.   . Other and unspecified hyperlipidemia: continue antara  . Unspecified hypothyroidism : TSH in process. Continue synthroid    Code Status: full Family Communication:  Disposition Plan: home when ready   Consultants:  none  Procedures:  none  Antibiotics:   HPI/Subjective: Somewhat obtunded. Will arouse to verbal stimuli. Denies pain/discomfort sob  Objective: Filed Vitals:   11/03/12 0000 11/03/12 0217 11/03/12 0335 11/03/12 0607  BP:  107/55 131/82 105/48  Pulse: 70 68 77 88  Temp:  97.4 F (36.3 C) 97.3 F (36.3 C) 98.4 F (36.9 C)  TempSrc:  Oral    Resp:  21 24 22   Height:   5\' 5"  (1.651 m)   Weight:   159.8 kg (352 lb 4.7 oz)   SpO2: 92% 87% 91% 88%    Intake/Output Summary (Last 24 hours) at 11/03/12 8119 Last data filed at 11/03/12 0600  Gross per 24 hour  Intake 381.25 ml  Output      0 ml  Net 381.25 ml   Filed Weights   Nov 07, 2012 2340 11/03/12 0335  Weight: 136.079 kg (300 lb) 159.8 kg (352 lb 4.7 oz)    Exam:   General:  Morbidly obese lethargic  Cardiovascular: RRR No MGR   Respiratory: mild increased work of breathing with conversation, pauses while sleeping, BS distant no wheeze no rhonchi  Abdomen: morbidly obese soft +BS non-tender   Musculoskeletal: ace on left ankle intact. Toes left foot warm. Chronic venous changes LE   Data Reviewed: Basic Metabolic  Panel:  Recent Labs Lab 11/07/2012 0934 2012-11-07 2344  NA 135 135  K 3.9 3.9  CL 94* 94*  CO2 36* 33*  GLUCOSE 143* 140*  BUN 10 13  CREATININE 0.54 0.68  CALCIUM 9.2 9.2   Liver Function Tests:  Recent Labs Lab 11/07/2012 2344  AST 30  ALT 29  ALKPHOS 92  BILITOT 0.6  PROT 7.6  ALBUMIN 3.6     CBC:  Recent Labs Lab 11-07-12 0934 Nov 07, 2012 2344  WBC 9.0 9.9  NEUTROABS 7.1 7.6  HGB 15.2* 14.8  HCT 45.8 45.1  MCV 96.4 96.6  PLT 230 240   Cardiac Enzymes:  Recent Labs Lab 11-07-2012 2344  TROPONINI <0.30        Studies: Dg Ankle Complete Left  2012/11/07  *RADIOLOGY REPORT*  Clinical Data: Left ankle pain post fall this morning  LEFT ANKLE COMPLETE - 3+ VIEW  Comparison: None  Findings: Marked soft tissue swelling. Widening of medial ankle mortise. Oblique medial malleolar fracture, nondisplaced. Oblique lateral malleolar fracture, mildly displaced posteriorly and medially. Minimally displaced posterior malleolar fracture fragment. No dislocation or bone destruction. Large plantar calcaneal spur. Numerous soft tissue calcifications within distal lower leg.  IMPRESSION: Trimalleolar fractures left ankle.   Original Report Authenticated By: Ulyses Southward, M.D.    Ct Angio Chest Pe W/cm &/or Wo Cm  11/03/2012  *RADIOLOGY REPORT*  Clinical Data: 47 year old female with shortness of breath, hypoxia.  Recent  ankle fracture.  CT ANGIOGRAPHY CHEST  Technique:  Multidetector CT imaging of the chest using the standard protocol during bolus administration of intravenous contrast. Multiplanar reconstructed images including MIPs were obtained and reviewed to evaluate the vascular anatomy.  Contrast: OMNIPAQUE IOHEXOL 350 MG/ML SOLN  Comparison: 09/18/2006.  Findings: Suboptimal contrast bolus timing in the pulmonary arterial tree.  No central pulmonary artery filling defect.  Motion artifact at the lung bases.  No convincing pulmonary artery filling defect identified to suggest  acute pulmonary embolus.  Increased mediastinal lipomatosis.  Mild cardiomegaly.  No pericardial effusion.  Atherosclerosis of the aortic arch and great vessel origins, otherwise negative visualized aorta.  Stable mediastinal lymph nodes.  Negative visualized thoracic inlet.  Major airways are patent.  Chronic central lobular upper lobe emphysema, moderate to severe.  Dependent pulmonary atelectasis. No consolidation. No pleural effusions.  Chronic posterior rib fractures more numerous on the left. No acute osseous abnormality identified.  Cholelithiasis with multiple rim calcified dependent gallstones. Decreased density throughout the visualized liver compatible with steatosis.  Negative visualized spleen and pancreas.  There is a large low-density (18 HU) rim calcified lesion occupying the area of the right adrenal gland, measuring up to 67 mm diameter. This is not included on the prior study.  This does not appear to be associated with the right kidney. No normal right adrenal gland is visible.  The left adrenal gland is normal.  No upper abdominal lymphadenopathy.  IMPRESSION: 1.  Suboptimal contrast bolus timing but no convincing evidence of acute pulmonary embolus. 2.  Emphysema with lung base atelectasis. 3. Cystic rim calcified mass occupying the area of the right adrenal gland.  Favor sequelae of prior adrenal hemorrhage.  Other etiologies (adrenal neoplasm) less likely. 4.  Hepatic steatosis.  Cholelithiasis.   Original Report Authenticated By: Erskine Speed, M.D.    Dg Chest Portable 1 View  11/03/2012  *RADIOLOGY REPORT*  Clinical Data: 47 year old female hypoxemia.  Ankle injury.  PORTABLE CHEST - 1 VIEW  Comparison: 11/02/2012 and earlier.  Findings: Portable upright AP view 1125 hours.  Stable lung volumes.  Stable perihilar and basilar linear opacity.  Interval decrease vascular congestion.  No pneumothorax, pleural effusion or acute pulmonary opacity identified.  Stable cardiac size and mediastinal  contours.  Visualized tracheal air column is within normal limits.  IMPRESSION: Interval resolved vascular congestion.  Mild bibasilar streaky opacity felt related to chronic lung disease. No superimposed acute findings are identified.   Original Report Authenticated By: Erskine Speed, M.D.    Dg Chest Port 1 View  11/02/2012  *RADIOLOGY REPORT*  Clinical Data: Shortness of breath  PORTABLE CHEST - 1 VIEW  Comparison: 09/18/2006  Findings: Cardiomediastinal silhouette is stable.  Mild perihilar increased interstitial markings suspicious for mild interstitial edema.  No focal infiltrate.  IMPRESSION: Mild bilateral perihilar interstitial prominence suspicious for mild edema.  No segmental infiltrate.   Original Report Authenticated By: Natasha Mead, M.D.     Scheduled Meds: . sodium chloride   Intravenous STAT  . antiseptic oral rinse  15 mL Mouth Rinse BID  . aspirin EC  81 mg Oral Daily  . enoxaparin (LOVENOX) injection  40 mg Subcutaneous Q24H  . famotidine  20 mg Oral BID  . fenofibrate  160 mg Oral Daily  . hydrochlorothiazide  25 mg Oral Daily  . levothyroxine  200 mcg Oral QAC breakfast  . losartan  100 mg Oral Daily  . sodium chloride  3 mL Intravenous Q12H  Continuous Infusions:   Active Problems:   Trimalleolar fracture of ankle, closed   Obesity hypoventilation syndrome   Hypoxia   Morbid obesity   Essential hypertension, benign   Other and unspecified hyperlipidemia   Unspecified hypothyroidism   Tobacco abuse    Time spent: 30 minutes    [facility] M  [facility]  If 7PM-7AM, please contact night-coverage at [email or web address], password [email or web address] 11/03/2012, 9:52 AM  LOS: 1 day   Attending: Patient seen and examined and above note modified. Await orthopedic opinion. She will need sleep study to document her sleep apnea so that she can receive her CPAP machine again. Will consult pulmonology to see if this can be expediated.

## 2012-11-03 NOTE — ED Notes (Signed)
3 RNs have each attempted twice to place an IV without success.

## 2012-11-03 NOTE — ED Provider Notes (Signed)
See prior note   Ward Givens, MD 11/03/12 2117

## 2012-11-04 ENCOUNTER — Encounter (HOSPITAL_COMMUNITY): Payer: Self-pay

## 2012-11-04 ENCOUNTER — Inpatient Hospital Stay (HOSPITAL_COMMUNITY): Payer: Self-pay

## 2012-11-04 DIAGNOSIS — R0902 Hypoxemia: Secondary | ICD-10-CM

## 2012-11-04 DIAGNOSIS — J9620 Acute and chronic respiratory failure, unspecified whether with hypoxia or hypercapnia: Principal | ICD-10-CM

## 2012-11-04 DIAGNOSIS — I959 Hypotension, unspecified: Secondary | ICD-10-CM

## 2012-11-04 DIAGNOSIS — S82853A Displaced trimalleolar fracture of unspecified lower leg, initial encounter for closed fracture: Secondary | ICD-10-CM

## 2012-11-04 DIAGNOSIS — E662 Morbid (severe) obesity with alveolar hypoventilation: Secondary | ICD-10-CM

## 2012-11-04 LAB — BASIC METABOLIC PANEL
Calcium: 8.7 mg/dL (ref 8.4–10.5)
Creatinine, Ser: 0.94 mg/dL (ref 0.50–1.10)
GFR calc Af Amer: 82 mL/min — ABNORMAL LOW (ref >90)
GFR calc non Af Amer: 71 mL/min — ABNORMAL LOW (ref >90)
Sodium: 135 mEq/L (ref 135–145)

## 2012-11-04 LAB — BLOOD GAS, ARTERIAL
Acid-Base Excess: 8.1 mmol/L — ABNORMAL HIGH (ref 0.0–2.0)
Delivery systems: POSITIVE
FIO2: 0.4 %
O2 Content: 40 L/min
O2 Saturation: 96.5 %
Patient temperature: 37
TCO2: 29.9 mmol/L (ref 0–100)
pCO2 arterial: 60.8 mmHg (ref 35.0–45.0)

## 2012-11-04 LAB — PROCALCITONIN: Procalcitonin: <0.1 ng/mL

## 2012-11-04 LAB — URINALYSIS, ROUTINE W REFLEX MICROSCOPIC
Leukocytes, UA: NEGATIVE
Nitrite: NEGATIVE
Specific Gravity, Urine: >1.03 — ABNORMAL HIGH (ref 1.005–1.030)
pH: 5.5 (ref 5.0–8.0)

## 2012-11-04 LAB — CBC
Platelets: 212 10*3/uL (ref 150–400)
RBC: 4.45 MIL/uL (ref 3.87–5.11)
RDW: 15.6 % — ABNORMAL HIGH (ref 11.5–15.5)
WBC: 7.7 10*3/uL (ref 4.0–10.5)

## 2012-11-04 LAB — URINE MICROSCOPIC-ADD ON

## 2012-11-04 LAB — LACTIC ACID, PLASMA: Lactic Acid, Venous: 1 mmol/L (ref 0.5–2.2)

## 2012-11-04 MED ORDER — SODIUM CHLORIDE 0.9 % IV SOLN
INTRAVENOUS | Status: DC
  Administered 2012-11-04: 14:00:00 via INTRAVENOUS

## 2012-11-04 MED ORDER — KETOROLAC TROMETHAMINE 30 MG/ML IJ SOLN
30.0000 mg | Freq: Four times a day (QID) | INTRAMUSCULAR | Status: DC | PRN
  Administered 2012-11-04 – 2012-11-06 (×2): 30 mg via INTRAVENOUS
  Filled 2012-11-04 (×2): qty 1

## 2012-11-04 MED ORDER — MORPHINE SULFATE 2 MG/ML IJ SOLN: 2.0000 mg | INTRAMUSCULAR | Status: DC | PRN

## 2012-11-04 MED ORDER — KETOROLAC TROMETHAMINE 30 MG/ML IJ SOLN
INTRAMUSCULAR | Status: AC
  Administered 2012-11-04: 30 mg
  Filled 2012-11-04: qty 1

## 2012-11-04 MED ORDER — SODIUM CHLORIDE 0.9 % IV BOLUS (SEPSIS)
500.0000 mL | Freq: Once | INTRAVENOUS | Status: AC
  Administered 2012-11-04: 500 mL via INTRAVENOUS

## 2012-11-04 MED ORDER — ENOXAPARIN SODIUM 80 MG/0.8ML ~~LOC~~ SOLN
80.0000 mg | SUBCUTANEOUS | Status: DC
  Administered 2012-11-04 – 2012-11-06 (×3): 80 mg via SUBCUTANEOUS
  Filled 2012-11-04 (×3): qty 0.8

## 2012-11-04 MED ORDER — PIPERACILLIN-TAZOBACTAM 3.375 G IVPB
3.3750 g | Freq: Three times a day (TID) | INTRAVENOUS | Status: DC
  Administered 2012-11-04 – 2012-11-06 (×6): 3.375 g via INTRAVENOUS
  Filled 2012-11-04 (×13): qty 50

## 2012-11-04 NOTE — Progress Notes (Signed)
UR chart review completed.  

## 2012-11-04 NOTE — Evaluation (Addendum)
Occupational Therapy Evaluation Patient Details Name: [name] MRN: [id number] DOB: [birth year] Today's Date: 11/04/2012 Time: 1315-1330 OT Time Calculation (min): 15 min  OT Assessment / Plan / Recommendation Clinical Impression  Patient is a 47 y/o female s/p Left ankle fx presenting to acute OT with deficits below. At this time, patient is on bedrest and is unable to transfer out of bed.  Patient is NWB LLE. Patient was independent PTA.  Unsure if patient will be able to function safely at home due to weightbearing status and body weight. I recommend SNf at D/C at this time.     OT Assessment  Patient needs continued OT Services    Follow Up Recommendations  SNF    Barriers to Discharge None    Equipment Recommendations  Tub/shower bench;3 in 1 bedside comode;Other (comment) (all equipment needs to be bariatric)       Frequency  Min 2X/week    Precautions / Restrictions Precautions Precautions: Fall Restrictions Weight Bearing Restrictions: Yes LLE Weight Bearing: Non weight bearing   Pertinent Vitals/Pain No complaints    ADL  Transfers/Ambulation Related to ADLs: Unable to transfer out of bed at this time. bed rest. ADL Comments: Patient unable to perform any bathing or dressing tasks at this time due to sleep apnea.    OT Diagnosis: Generalized weakness  OT Problem List: Decreased strength;Pain;Decreased safety awareness;Decreased activity tolerance;Obesity;Impaired balance (sitting and/or standing);Decreased knowledge of precautions OT Treatment Interventions: Self-care/ADL training;Therapeutic activities;Therapeutic exercise;DME and/or AE instruction;Energy conservation;Patient/family education;Balance training;Manual therapy;Modalities   OT Goals Acute Rehab OT Goals OT Goal Formulation: With patient Time For Goal Achievement: 11/18/12 Potential to Achieve Goals: Good ADL Goals Pt Will Perform Upper Body Bathing: with supervision ADL Goal: Upper Body  Bathing - Progress: Goal set today Pt Will Perform Lower Body Bathing: with min assist ADL Goal: Lower Body Bathing - Progress: Goal set today Pt Will Perform Upper Body Dressing: with supervision ADL Goal: Upper Body Dressing - Progress: Goal set today Pt Will Perform Lower Body Dressing: with min assist ADL Goal: Lower Body Dressing - Progress: Goal set today Pt Will Transfer to Toilet: with min assist;Maintaining weight bearing status ADL Goal: Toilet Transfer - Progress: Goal set today Arm Goals Pt Will Complete Theraband Exer: with supervision, verbal cues required/provided;to maintain strength;Bilateral upper extremities;2 sets;15 reps;Level 2 Theraband Arm Goal: Theraband Exercises - Progress: Goal set today  Visit Information  Last OT Received On: 11/04/12    Subjective Data  Subjective: S: What day is is? Patient Stated Goal: To go home.    Prior Functioning     Home Living Lives With: Significant other Available Help at Discharge: Friend(s) Type of Home: House Home Access: Stairs to enter (ramp being built) Secretary/administrator of Steps: 5 Entrance Stairs-Rails: Right Home Layout: One level Firefighter: Standard Home Adaptive Equipment: None Prior Function Level of Independence: Independent Able to Take Stairs?: Yes Driving: Yes Vocation: Unemployed Communication Communication: No difficulties Dominant Hand: Right         Vision/Perception Vision - History Baseline Vision: No visual deficits Patient Visual Report: No change from baseline   Cognition  Cognition Arousal/Alertness: Lethargic (alert but not awake for entire tx session) Behavior During Therapy: WFL for tasks assessed/performed Overall Cognitive Status: Within Functional Limits for tasks assessed    Extremity/Trunk Assessment Right Upper Extremity Assessment RUE ROM/Strength/Tone: Deficits RUE ROM/Strength/Tone Deficits: A/ROM decreased due to increased weight. MMT: 3+/5 in all  ranges. Left Upper Extremity Assessment LUE ROM/Strength/Tone: Deficits LUE ROM/Strength/Tone  Deficits: A/ROM decreased due to increased weight. MMT: 3+/5     Mobility Bed Mobility Bed Mobility: Not assessed Transfers Transfers: Not assessed     Exercise General Exercises - Upper Extremity Shoulder Flexion: Strengthening;Both;15 reps;Supine;Theraband Theraband Level (Shoulder Flexion): Level 2 (Red) Shoulder Extension: Strengthening;Both;15 reps;Theraband;Supine Theraband Level (Shoulder Extension): Level 2 (Red) Shoulder Horizontal ABduction: Strengthening;Both;15 reps;Supine;Theraband Theraband Level (Shoulder Horizontal Abduction): Level 2 (Red) Shoulder Horizontal ADduction: Strengthening;Both;15 reps;Supine;Theraband Theraband Level (Shoulder Horizontal Adduction): Level 2 (Red) Elbow Flexion: Strengthening;Both;15 reps;Supine;Theraband Theraband Level (Elbow Flexion): Level 2 (Red) Elbow Extension: Strengthening;Both;15 reps;Theraband;Supine Theraband Level (Elbow Extension): Level 2 (Red)   Balance Balance Balance Assessed: No   End of Session OT - End of Session Activity Tolerance: Patient tolerated treatment well Patient left: in bed;with call bell/phone within reach;with bed alarm set;with nursing in room   [facility], OTR/L,CBIS   11/04/2012, 1:42 PM

## 2012-11-04 NOTE — Progress Notes (Signed)
ANTIBIOTIC CONSULT NOTE - INITIAL  Pharmacy Consult for Zosyn Indication: UTI  No Known Allergies  Patient Measurements: Height:  (5 feet 5 inches) Weight: 352 lb 4.7 oz (159.8 kg) IBW/kg (Calculated) : 57 Adjusted Body Weight: 87Kg  Vital Signs: Temp: 98 F (36.7 C) (05/08 0400) Temp src: Axillary (05/08 0400) BP: 90/48 mmHg (05/08 0000) Pulse Rate: 67 (05/08 0915) Intake/Output from previous day: 05/07 0701 - 05/08 0700 In: 1438 [P.O.:1200; I.V.:238] Out: 450 [Urine:450] Intake/Output from this shift:    Labs:  Recent Labs  11/02/12 0934 11/02/12 2344 11/04/12 0454  WBC 9.0 9.9 7.7  HGB 15.2* 14.8 14.2  PLT 230 240 212  CREATININE 0.54 0.68 0.94   Estimated Creatinine Clearance: 113.3 ml/min (by C-G formula based on Cr of 0.94). No results found for this basename: VANCOTROUGH, Leodis Binet, VANCORANDOM, GENTTROUGH, GENTPEAK, GENTRANDOM, TOBRATROUGH, TOBRAPEAK, TOBRARND, AMIKACINPEAK, AMIKACINTROU, AMIKACIN,  in the last 72 hours   Microbiology: Recent Results (from the past 720 hour(s))  MRSA PCR SCREENING     Status: None   Collection Time    11/03/12  1:13 PM      Result Value Range Status   MRSA by PCR NEGATIVE  NEGATIVE Final   Comment:            The GeneXpert MRSA Assay (FDA     approved for NASAL specimens     only), is one component of a     comprehensive MRSA colonization     surveillance program. It is not     intended to diagnose MRSA     infection nor to guide or     monitor treatment for     MRSA infections.  CULTURE, BLOOD (ROUTINE X 2)     Status: None   Collection Time    11/04/12  8:59 AM      Result Value Range Status   Specimen Description Blood   Final   Special Requests NONE   Final   Culture NO GROWTH <24 HRS   Final   Report Status PENDING   Incomplete  CULTURE, BLOOD (ROUTINE X 2)     Status: None   Collection Time    11/04/12  9:09 AM      Result Value Range Status   Specimen Description Blood   Final   Special Requests  NONE   Final   Culture NO GROWTH <24 HRS   Final   Report Status PENDING   Incomplete   Medical History: Past Medical History  Diagnosis Date  . Thyroid disease   . Hypertension   . Hyperlipidemia   . COPD (chronic obstructive pulmonary disease)     Medications:  Scheduled:  . [COMPLETED] sodium chloride   Intravenous STAT  . antiseptic oral rinse  15 mL Mouth Rinse BID  . aspirin EC  81 mg Oral Daily  . enoxaparin (LOVENOX) injection  80 mg Subcutaneous Q24H  . famotidine  20 mg Oral BID  . fenofibrate  160 mg Oral Daily  . levothyroxine  200 mcg Oral QAC breakfast  . piperacillin-tazobactam (ZOSYN)  IV  3.375 g Intravenous Q8H  . sodium chloride  500 mL Intravenous Once  . sodium chloride  3 mL Intravenous Q12H  . [DISCONTINUED] enoxaparin (LOVENOX) injection  40 mg Subcutaneous Q24H  . [DISCONTINUED] hydrochlorothiazide  25 mg Oral Daily  . [DISCONTINUED] losartan  100 mg Oral Daily   Assessment: 46yo obese female with suspected UTI.  Zosyn added per MD.  Pt has good  renal fxn.  Estimated Creatinine Clearance: 113.3 ml/min (by C-G formula based on Cr of 0.94).  Goal of Therapy:  Eradicate infection.  Plan:  Zosyn 3.375gm IV q8hrs to be infused over 4 hours Monitor labs, renal fxn, and cultures  Margo Aye, Lariah Fleer A 11/04/2012,10:34 AM

## 2012-11-04 NOTE — Progress Notes (Signed)
[facility] PROGRESS NOTE  [name] NFA:[id number] DOB: [birth year] DOA: 11/02/2012 PCP: Dwana Melena, MD  Assessment/Plan: 1- Acute Respiratory Failure, Hypoxic, Hypercapnia: Respiratory acidosis.  Possible obesity hypoventilation syndrome. CT chest angio negative for PE. Continue with BIPAP. Stat ABG. Pulmonary consulted. Avoid sedatives. She has not received sedatives.  2-Hypotension: Stop HCTZ, cozaar. 500 cc IV bolus. Start IV fluids. Check lactic acid, pro-calcitonin level, blood culture.  UA pending, will go ahead and start zosyn to cover for infection. If UA negative can stop zosyn.  3-hyperlipidemia: continue with fenofibrate.  4-hypothyroidism: Continue with synthroid, TSH elevated at 24. Check free t 3 and t4.  5-Trimalleolar fractures left ankle: ortho following. Patient high risk for surgery. Patient will need to be stable from respiratory status prior to any surgical procedure.      Code Status: Full  Family Communication: Care discussed with father who was at bedside.  Disposition Plan: Remain ICU level.    Consultants:  Ortho.  Pulmonary.   Procedures:  none  Antibiotics:  none  HPI/Subjective: Answering question, fall sleep easy. She denies chest pain, dyspnea, abdominal pain, no cough. No diarrhea.  Per nurse patient had BM day prior to admission.   Objective: Filed Vitals:   11/04/12 0400 11/04/12 0416 11/04/12 0500 11/04/12 0600  BP:      Pulse:  71 64   Temp: 98 F (36.7 C)     TempSrc: Axillary     Resp: 17 20 23 22   Height:      Weight:      SpO2:  97% 91%     Intake/Output Summary (Last 24 hours) at 11/04/12 0903 Last data filed at 11/04/12 0000  Gross per 24 hour  Intake   1438 ml  Output    450 ml  Net    988 ml   Filed Weights   11/02/12 2340 11/03/12 0335 11/03/12 1145  Weight: 136.079 kg (300 lb) 159.8 kg (352 lb 4.7 oz) 159.8 kg (352 lb 4.7 oz)    Exam:   General: awake, eating breakfast, fall sleep.    Cardiovascular: S 1, S 2 RRR  Respiratory: No wheezes, no crackles.   Abdomen: obese, NT No rigidity.   Musculoskeletal: left LE with dressing.   Data Reviewed: Basic Metabolic Panel:  Recent Labs Lab 11/02/12 0934 11/02/12 2344 11/04/12 0454  NA 135 135 135  K 3.9 3.9 4.0  CL 94* 94* 94*  CO2 36* 33* 34*  GLUCOSE 143* 140* 117*  BUN 10 13 18   CREATININE 0.54 0.68 0.94  CALCIUM 9.2 9.2 8.7   Liver Function Tests:  Recent Labs Lab 11/02/12 2344  AST 30  ALT 29  ALKPHOS 92  BILITOT 0.6  PROT 7.6  ALBUMIN 3.6   No results found for this basename: LIPASE, AMYLASE,  in the last 168 hours No results found for this basename: AMMONIA,  in the last 168 hours CBC:  Recent Labs Lab 11/02/12 0934 11/02/12 2344 11/04/12 0454  WBC 9.0 9.9 7.7  NEUTROABS 7.1 7.6  --   HGB 15.2* 14.8 14.2  HCT 45.8 45.1 44.7  MCV 96.4 96.6 100.4*  PLT 230 240 212   Cardiac Enzymes:  Recent Labs Lab 11/02/12 2344  TROPONINI <0.30   BNP (last 3 results) No results found for this basename: PROBNP,  in the last 8760 hours CBG: No results found for this basename: GLUCAP,  in the last 168 hours  Recent Results (from the past 240 hour(s))  MRSA PCR SCREENING  Status: None   Collection Time    11/03/12  1:13 PM      Result Value Range Status   MRSA by PCR NEGATIVE  NEGATIVE Final   Comment:            The GeneXpert MRSA Assay (FDA     approved for NASAL specimens     only), is one component of a     comprehensive MRSA colonization     surveillance program. It is not     intended to diagnose MRSA     infection nor to guide or     monitor treatment for     MRSA infections.     Studies: Ct Angio Chest Pe W/cm &/or Wo Cm  11/03/2012  *RADIOLOGY REPORT*  Clinical Data: 47 year old female with shortness of breath, hypoxia.  Recent ankle fracture.  CT ANGIOGRAPHY CHEST  Technique:  Multidetector CT imaging of the chest using the standard protocol during bolus administration  of intravenous contrast. Multiplanar reconstructed images including MIPs were obtained and reviewed to evaluate the vascular anatomy.  Contrast: OMNIPAQUE IOHEXOL 350 MG/ML SOLN  Comparison: 09/18/2006.  Findings: Suboptimal contrast bolus timing in the pulmonary arterial tree.  No central pulmonary artery filling defect.  Motion artifact at the lung bases.  No convincing pulmonary artery filling defect identified to suggest acute pulmonary embolus.  Increased mediastinal lipomatosis.  Mild cardiomegaly.  No pericardial effusion.  Atherosclerosis of the aortic arch and great vessel origins, otherwise negative visualized aorta.  Stable mediastinal lymph nodes.  Negative visualized thoracic inlet.  Major airways are patent.  Chronic central lobular upper lobe emphysema, moderate to severe.  Dependent pulmonary atelectasis. No consolidation. No pleural effusions.  Chronic posterior rib fractures more numerous on the left. No acute osseous abnormality identified.  Cholelithiasis with multiple rim calcified dependent gallstones. Decreased density throughout the visualized liver compatible with steatosis.  Negative visualized spleen and pancreas.  There is a large low-density (18 HU) rim calcified lesion occupying the area of the right adrenal gland, measuring up to 67 mm diameter. This is not included on the prior study.  This does not appear to be associated with the right kidney. No normal right adrenal gland is visible.  The left adrenal gland is normal.  No upper abdominal lymphadenopathy.  IMPRESSION: 1.  Suboptimal contrast bolus timing but no convincing evidence of acute pulmonary embolus. 2.  Emphysema with lung base atelectasis. 3. Cystic rim calcified mass occupying the area of the right adrenal gland.  Favor sequelae of prior adrenal hemorrhage.  Other etiologies (adrenal neoplasm) less likely. 4.  Hepatic steatosis.  Cholelithiasis.   Original Report Authenticated By: Erskine Speed, M.D.    Dg Chest  Portable 1 View  11/03/2012  *RADIOLOGY REPORT*  Clinical Data: 47 year old female hypoxemia.  Ankle injury.  PORTABLE CHEST - 1 VIEW  Comparison: 11/02/2012 and earlier.  Findings: Portable upright AP view 1125 hours.  Stable lung volumes.  Stable perihilar and basilar linear opacity.  Interval decrease vascular congestion.  No pneumothorax, pleural effusion or acute pulmonary opacity identified.  Stable cardiac size and mediastinal contours.  Visualized tracheal air column is within normal limits.  IMPRESSION: Interval resolved vascular congestion.  Mild bibasilar streaky opacity felt related to chronic lung disease. No superimposed acute findings are identified.   Original Report Authenticated By: Erskine Speed, M.D.    Dg Chest Port 1 View  11/02/2012  *RADIOLOGY REPORT*  Clinical Data: Shortness of breath  PORTABLE CHEST -  1 VIEW  Comparison: 09/18/2006  Findings: Cardiomediastinal silhouette is stable.  Mild perihilar increased interstitial markings suspicious for mild interstitial edema.  No focal infiltrate.  IMPRESSION: Mild bilateral perihilar interstitial prominence suspicious for mild edema.  No segmental infiltrate.   Original Report Authenticated By: Natasha Mead, M.D.     Scheduled Meds: . antiseptic oral rinse  15 mL Mouth Rinse BID  . aspirin EC  81 mg Oral Daily  . enoxaparin (LOVENOX) injection  40 mg Subcutaneous Q24H  . famotidine  20 mg Oral BID  . fenofibrate  160 mg Oral Daily  . levothyroxine  200 mcg Oral QAC breakfast  . sodium chloride  500 mL Intravenous Once  . sodium chloride  3 mL Intravenous Q12H   Continuous Infusions: . sodium chloride      Active Problems:   Trimalleolar fracture of ankle, closed   Obesity hypoventilation syndrome   Hypoxia   Morbid obesity   Essential hypertension, benign   Other and unspecified hyperlipidemia   Unspecified hypothyroidism   Tobacco abuse    Time spent: 35 minutes.    Aime Carreras  [facility] Pager  [phone]. If 7PM-7AM, please contact night-coverage at [email or web address], password [facility] 11/04/2012, 9:03 AM  LOS: 2 days

## 2012-11-04 NOTE — Consult Note (Signed)
Reason for Consult:trimalleolar fracture of the left ankle Referring Physician: Hospitalist  [name] is an 47 y.o. female.  HPI: She reportedly fell and hurt her left ankle two days ago at home.  She was seen in the ER.  She has a nondisplaced fracture of the left ankle, trimalleolar type.  She was placed in a splint and advised to be admitted because of marked hypoxia noted in the ER.  She refused and went home but returned a short time later.  Since then she was admitted and now in ICU on a breathing apparatus.  She has had episodes of significant hypoxia.  She is not responding and is very lethargic, responding only to deep stimuli.  I cannot obviously talk to her or get a better history.  I did obtain a new portable of the left ankle and the trimalleolar fracture is still nondisplaced in a posterior splint.  Past Medical History  Diagnosis Date  . Thyroid disease   . Hypertension   . Hyperlipidemia   . COPD (chronic obstructive pulmonary disease)     Past Surgical History  Procedure Laterality Date  . Tubal ligation    . Ankle surgery      Family History  Problem Relation Age of Onset  . Cancer Mother   . Heart failure Father     Social History:  reports that she has been smoking Cigarettes.  She has a 30 pack-year smoking history. She uses smokeless tobacco. She reports that she does not drink alcohol or use illicit drugs.  Allergies: No Known Allergies  Medications: I have reviewed the patient's current medications.  Results for orders placed during the hospital encounter of 11/02/12 (from the past 48 hour(s))  CBC WITH DIFFERENTIAL     Status: None   Collection Time    11/02/12 11:44 PM      Result Value Range   WBC 9.9  4.0 - 10.5 K/uL   RBC 4.67  3.87 - 5.11 MIL/uL   Hemoglobin 14.8  12.0 - 15.0 g/dL   HCT 96.0  45.4 - 09.8 %   MCV 96.6  78.0 - 100.0 fL   MCH 31.7  26.0 - 34.0 pg   MCHC 32.8  30.0 - 36.0 g/dL   RDW 11.9  14.7 - 82.9 %   Platelets 240   150 - 400 K/uL   Neutrophils Relative 77  43 - 77 %   Neutro Abs 7.6  1.7 - 7.7 K/uL   Lymphocytes Relative 16  12 - 46 %   Lymphs Abs 1.5  0.7 - 4.0 K/uL   Monocytes Relative 6  3 - 12 %   Monocytes Absolute 0.6  0.1 - 1.0 K/uL   Eosinophils Relative 1  0 - 5 %   Eosinophils Absolute 0.1  0.0 - 0.7 K/uL   Basophils Relative 0  0 - 1 %   Basophils Absolute 0.0  0.0 - 0.1 K/uL  COMPREHENSIVE METABOLIC PANEL     Status: Abnormal   Collection Time    11/02/12 11:44 PM      Result Value Range   Sodium 135  135 - 145 mEq/L   Potassium 3.9  3.5 - 5.1 mEq/L   Chloride 94 (*) 96 - 112 mEq/L   CO2 33 (*) 19 - 32 mEq/L   Glucose, Bld 140 (*) 70 - 99 mg/dL   BUN 13  6 - 23 mg/dL   Creatinine, Ser 5.62  0.50 - 1.10 mg/dL  Calcium 9.2  8.4 - 10.5 mg/dL   Total Protein 7.6  6.0 - 8.3 g/dL   Albumin 3.6  3.5 - 5.2 g/dL   AST 30  0 - 37 U/L   ALT 29  0 - 35 U/L   Alkaline Phosphatase 92  39 - 117 U/L   Total Bilirubin 0.6  0.3 - 1.2 mg/dL   GFR calc non Af Amer >90  >90 mL/min   GFR calc Af Amer >90  >90 mL/min   Comment:            The eGFR has been calculated     using the CKD EPI equation.     This calculation has not been     validated in all clinical     situations.     eGFR's persistently     <90 mL/min signify     possible Chronic Kidney Disease.  TROPONIN I     Status: None   Collection Time    11/02/12 11:44 PM      Result Value Range   Troponin I <0.30  <0.30 ng/mL   Comment:            Due to the release kinetics of cTnI,     a negative result within the first hours     of the onset of symptoms does not rule out     myocardial infarction with certainty.     If myocardial infarction is still suspected,     repeat the test at appropriate intervals.  D-DIMER, QUANTITATIVE     Status: Abnormal   Collection Time    11/02/12 11:44 PM      Result Value Range   D-Dimer, Quant 2.20 (*) 0.00 - 0.48 ug/mL-FEU   Comment:            AT THE INHOUSE ESTABLISHED CUTOFF      VALUE OF 0.48 ug/mL FEU,     THIS ASSAY HAS BEEN DOCUMENTED     IN THE LITERATURE TO HAVE     A SENSITIVITY AND NEGATIVE     PREDICTIVE VALUE OF AT LEAST     98 TO 99%.  THE TEST RESULT     SHOULD BE CORRELATED WITH     AN ASSESSMENT OF THE CLINICAL     PROBABILITY OF DVT / VTE.  BLOOD GAS, ARTERIAL     Status: Abnormal   Collection Time    11/02/12 11:47 PM      Result Value Range   O2 Content 4.0     Delivery systems NASAL CANNULA     pH, Arterial 7.381  7.350 - 7.450   pCO2 arterial 56.5 (*) 35.0 - 45.0 mmHg   pO2, Arterial 75.6 (*) 80.0 - 100.0 mmHg   Bicarbonate 32.7 (*) 20.0 - 24.0 mEq/L   TCO2 28.7  0 - 100 mmol/L   Acid-Base Excess 7.6 (*) 0.0 - 2.0 mmol/L   O2 Saturation 94.4     Patient temperature 37.0     Collection site RIGHT RADIAL     Drawn by [phone]     Sample type ARTERIAL DRAW     Allens test (pass/fail) PASS  PASS  TSH     Status: Abnormal   Collection Time    11/03/12 12:00 AM      Result Value Range   TSH 24.266 (*) 0.350 - 4.500 uIU/mL  HEMOGLOBIN A1C     Status: Abnormal   Collection Time    11/03/12 12:00  AM      Result Value Range   Hemoglobin A1C 8.1 (*) <5.7 %   Comment: (NOTE)                                                                               According to the ADA Clinical Practice Recommendations for 2011, when     HbA1c is used as a screening test:      >=6.5%   Diagnostic of Diabetes Mellitus               (if abnormal result is confirmed)     5.7-6.4%   Increased risk of developing Diabetes Mellitus     References:Diagnosis and Classification of Diabetes Mellitus,Diabetes     Care,2011,34(Suppl 1):S62-S69 and Standards of Medical Care in             Diabetes - 2011,Diabetes Care,2011,34 (Suppl 1):S11-S61.   Mean Plasma Glucose 186 (*) <117 mg/dL  BILIRUBIN, DIRECT     Status: None   Collection Time    11/03/12 12:00 AM      Result Value Range   Bilirubin, Direct 0.1  0.0 - 0.3 mg/dL  BLOOD GAS, ARTERIAL     Status: Abnormal    Collection Time    11/03/12 10:30 AM      Result Value Range   O2 Content 4.0     Delivery systems NASAL CANNULA     pH, Arterial 7.286 (*) 7.350 - 7.450   pCO2 arterial 77.2 (*) 35.0 - 45.0 mmHg   Comment: CRITICAL RESULT CALLED TO, READ BACK BY AND VERIFIED WITH:     GORDAN,A.RN 11/03/12 AT 1035 BY BROADNAX,L.RRT   pO2, Arterial 52.2 (*) 80.0 - 100.0 mmHg   Bicarbonate 35.6 (*) 20.0 - 24.0 mEq/L   TCO2 32.2  0 - 100 mmol/L   Acid-Base Excess 9.0 (*) 0.0 - 2.0 mmol/L   O2 Saturation 82.3     Patient temperature 37.0     Collection site RIGHT BRACHIAL     Drawn by COLLECTED BY RT     Sample type ARTERIAL     Allens test (pass/fail) PASS  PASS  BLOOD GAS, ARTERIAL     Status: Abnormal   Collection Time    11/03/12  1:05 PM      Result Value Range   FIO2 0.70     Delivery systems BILEVEL POSITIVE AIRWAY PRESSURE     Inspiratory PAP 20.0     Expiratory PAP 8.0     Pressure support 12.0     pH, Arterial 7.291 (*) 7.350 - 7.450   pCO2 arterial 74.7 (*) 35.0 - 45.0 mmHg   Comment: CRITICAL RESULT CALLED TO, READ BACK BY AND VERIFIED WITH:     CINDY PHILLIPS, RN AT 1316 BY CHRISTY HALL, RRT,RCP ON 11/03/2012   pO2, Arterial 146.0 (*) 80.0 - 100.0 mmHg   Bicarbonate 34.9 (*) 20.0 - 24.0 mEq/L   TCO2 31.4  0 - 100 mmol/L   Acid-Base Excess 8.4 (*) 0.0 - 2.0 mmol/L   O2 Saturation 98.8     Patient temperature 37.0     Collection site RIGHT RADIAL     Drawn by Elbert Ewings, RRT,RCP  Sample type ARTERIAL DRAW     Allens test (pass/fail) PASS  PASS  MRSA PCR SCREENING     Status: None   Collection Time    11/03/12  1:13 PM      Result Value Range   MRSA by PCR NEGATIVE  NEGATIVE   Comment:            The GeneXpert MRSA Assay (FDA     approved for NASAL specimens     only), is one component of a     comprehensive MRSA colonization     surveillance program. It is not     intended to diagnose MRSA     infection nor to guide or     monitor treatment for     MRSA infections.   BASIC METABOLIC PANEL     Status: Abnormal   Collection Time    11/04/12  4:54 AM      Result Value Range   Sodium 135  135 - 145 mEq/L   Potassium 4.0  3.5 - 5.1 mEq/L   Chloride 94 (*) 96 - 112 mEq/L   CO2 34 (*) 19 - 32 mEq/L   Glucose, Bld 117 (*) 70 - 99 mg/dL   BUN 18  6 - 23 mg/dL   Creatinine, Ser 9.56  0.50 - 1.10 mg/dL   Calcium 8.7  8.4 - 21.3 mg/dL   GFR calc non Af Amer 71 (*) >90 mL/min   GFR calc Af Amer 82 (*) >90 mL/min   Comment:            The eGFR has been calculated     using the CKD EPI equation.     This calculation has not been     validated in all clinical     situations.     eGFR's persistently     <90 mL/min signify     possible Chronic Kidney Disease.  CBC     Status: Abnormal   Collection Time    11/04/12  4:54 AM      Result Value Range   WBC 7.7  4.0 - 10.5 K/uL   RBC 4.45  3.87 - 5.11 MIL/uL   Hemoglobin 14.2  12.0 - 15.0 g/dL   HCT 08.6  57.8 - 46.9 %   MCV 100.4 (*) 78.0 - 100.0 fL   MCH 31.9  26.0 - 34.0 pg   MCHC 31.8  30.0 - 36.0 g/dL   RDW 62.9 (*) 52.8 - 41.3 %   Platelets 212  150 - 400 K/uL  LACTIC ACID, PLASMA     Status: None   Collection Time    11/04/12  8:59 AM      Result Value Range   Lactic Acid, Venous 1.0  0.5 - 2.2 mmol/L  CULTURE, BLOOD (ROUTINE X 2)     Status: None   Collection Time    11/04/12  8:59 AM      Result Value Range   Specimen Description Blood     Special Requests NONE     Culture NO GROWTH <24 HRS     Report Status PENDING    PROCALCITONIN     Status: None   Collection Time    11/04/12  8:59 AM      Result Value Range   Procalcitonin <0.10     Comment:            Interpretation:     PCT (Procalcitonin) <= 0.5 ng/mL:  Systemic infection (sepsis) is not likely.     Local bacterial infection is possible.     (NOTE)             ICU PCT Algorithm               Non ICU PCT Algorithm        ----------------------------     ------------------------------             PCT < 0.25 ng/mL                  PCT < 0.1 ng/mL         Stopping of antibiotics            Stopping of antibiotics           strongly encouraged.               strongly encouraged.        ----------------------------     ------------------------------           PCT level decrease by               PCT < 0.25 ng/mL           >= 80% from peak PCT           OR PCT 0.25 - 0.5 ng/mL          Stopping of antibiotics                                                 encouraged.         Stopping of antibiotics               encouraged.        ----------------------------     ------------------------------           PCT level decrease by              PCT >= 0.25 ng/mL           < 80% from peak PCT            AND PCT >= 0.5 ng/mL            Continuing antibiotics                                                  encouraged.           Continuing antibiotics                encouraged.        ----------------------------     ------------------------------         PCT level increase compared          PCT > 0.5 ng/mL             with peak PCT AND              PCT >= 0.5 ng/mL             Escalation of antibiotics  strongly encouraged.          Escalation of antibiotics            strongly encouraged.  CULTURE, BLOOD (ROUTINE X 2)     Status: None   Collection Time    11/04/12  9:09 AM      Result Value Range   Specimen Description Blood     Special Requests NONE     Culture NO GROWTH <24 HRS     Report Status PENDING    BLOOD GAS, ARTERIAL     Status: Abnormal   Collection Time    11/04/12  9:50 AM      Result Value Range   FIO2 0.40     O2 Content 40.0     Delivery systems BILEVEL POSITIVE AIRWAY PRESSURE     Inspiratory PAP 20     Expiratory PAP 8     pH, Arterial 7.361  7.350 - 7.450   pCO2 arterial 60.8 (*) 35.0 - 45.0 mmHg   Comment: CRITICAL RESULT CALLED TO, READ BACK BY AND VERIFIED WITH:     NEDINE ROWE,RN BY PEVIANY LAWSON,RRT ON 11/04/2012 AT 1006.   pO2,  Arterial 83.0  80.0 - 100.0 mmHg   Bicarbonate 33.6 (*) 20.0 - 24.0 mEq/L   TCO2 29.9  0 - 100 mmol/L   Acid-Base Excess 8.1 (*) 0.0 - 2.0 mmol/L   O2 Saturation 96.5     Patient temperature 37.0     Collection site LEFT RADIAL     Drawn by [id number]     Sample type ARTERIAL     Allens test (pass/fail) PASS  PASS    Dg Ankle 2 Views Left  11/04/2012  *RADIOLOGY REPORT*  Clinical Data: Trimalleolar fracture of the left ankle.  LEFT ANKLE - 2 VIEW  Comparison: Radiographs dated 11/02/2012  Findings: A splint has been applied.  There is less displacement of the distal fibula fracture on the lateral view.  No change in the minimally displaced medial malleolar fracture.  No change in the posterior distal tibia fracture.  IMPRESSION: Slight improvement in displacement of the distal fibula fracture.   Original Report Authenticated By: Francene Boyers, M.D.    Ct Angio Chest Pe W/cm &/or Wo Cm  11/03/2012  *RADIOLOGY REPORT*  Clinical Data: 47 year old female with shortness of breath, hypoxia.  Recent ankle fracture.  CT ANGIOGRAPHY CHEST  Technique:  Multidetector CT imaging of the chest using the standard protocol during bolus administration of intravenous contrast. Multiplanar reconstructed images including MIPs were obtained and reviewed to evaluate the vascular anatomy.  Contrast: OMNIPAQUE IOHEXOL 350 MG/ML SOLN  Comparison: 09/18/2006.  Findings: Suboptimal contrast bolus timing in the pulmonary arterial tree.  No central pulmonary artery filling defect.  Motion artifact at the lung bases.  No convincing pulmonary artery filling defect identified to suggest acute pulmonary embolus.  Increased mediastinal lipomatosis.  Mild cardiomegaly.  No pericardial effusion.  Atherosclerosis of the aortic arch and great vessel origins, otherwise negative visualized aorta.  Stable mediastinal lymph nodes.  Negative visualized thoracic inlet.  Major airways are patent.  Chronic central lobular upper lobe emphysema,  moderate to severe.  Dependent pulmonary atelectasis. No consolidation. No pleural effusions.  Chronic posterior rib fractures more numerous on the left. No acute osseous abnormality identified.  Cholelithiasis with multiple rim calcified dependent gallstones. Decreased density throughout the visualized liver compatible with steatosis.  Negative visualized spleen and pancreas.  There is a large low-density (18 HU) rim calcified lesion  occupying the area of the right adrenal gland, measuring up to 67 mm diameter. This is not included on the prior study.  This does not appear to be associated with the right kidney. No normal right adrenal gland is visible.  The left adrenal gland is normal.  No upper abdominal lymphadenopathy.  IMPRESSION: 1.  Suboptimal contrast bolus timing but no convincing evidence of acute pulmonary embolus. 2.  Emphysema with lung base atelectasis. 3. Cystic rim calcified mass occupying the area of the right adrenal gland.  Favor sequelae of prior adrenal hemorrhage.  Other etiologies (adrenal neoplasm) less likely. 4.  Hepatic steatosis.  Cholelithiasis.   Original Report Authenticated By: Erskine Speed, M.D.    Dg Chest Portable 1 View  11/03/2012  *RADIOLOGY REPORT*  Clinical Data: 47 year old female hypoxemia.  Ankle injury.  PORTABLE CHEST - 1 VIEW  Comparison: 11/02/2012 and earlier.  Findings: Portable upright AP view 1125 hours.  Stable lung volumes.  Stable perihilar and basilar linear opacity.  Interval decrease vascular congestion.  No pneumothorax, pleural effusion or acute pulmonary opacity identified.  Stable cardiac size and mediastinal contours.  Visualized tracheal air column is within normal limits.  IMPRESSION: Interval resolved vascular congestion.  Mild bibasilar streaky opacity felt related to chronic lung disease. No superimposed acute findings are identified.   Original Report Authenticated By: Erskine Speed, M.D.     Review of Systems  Respiratory:       History of  hypoxia  Musculoskeletal: Positive for falls (fall at home on day of admission and hurt the left ankle.).   Blood pressure 90/48, pulse 67, temperature 98.2 F (36.8 C), temperature source Oral, resp. rate 18, height 5\' 5"  (1.651 m), weight 159.8 kg (352 lb 4.7 oz), last menstrual period 10/28/2012, SpO2 95.00%. Physical Exam  Constitutional: She appears well-developed and well-nourished.  Morbid obesity  Neck: Normal range of motion. Neck supple.  Cardiovascular: Normal rate, regular rhythm, normal heart sounds and intact distal pulses.   Respiratory: She has wheezes.  GI: Soft. Bowel sounds are normal.  Musculoskeletal:  She has a posterior splint on the left lower leg and foot and ankle.  Color is normal and has good capillary refill of the toes.    Neurological:  Very lethargic, responds to deep stimuli but will not talk or open eys.  Skin: Skin is warm and dry.  Psychiatric:  Very lethargic.  I cannot get her to talk or respond except to deep stimuli.  She is on breathing apparatus.    Assessment/Plan: Trimalleolar fracture of the left ankle, nondisplaced in a posterior splint. Hypoxia Very lethargic, need to get history when she is alert.  Maintain splint for now.  She will need to have limited to no weight bearing when she is able to get out of bed.    Jaylissa Felty 11/04/2012, 11:28 AM

## 2012-11-04 NOTE — Progress Notes (Signed)
Physical Therapy Treatment Patient Details Name: [name] MRN: [id number] DOB: October 19, 1965 Today's Date: 11/04/2012 Time: 4098-1191 PT Time Calculation (min): 28 min  PT Assessment / Plan / Recommendation Comments on Treatment Session  Pt was seen for ther ex to both LEs.   She had problems with hypoxia earlier today and is currently on Bi-pap continually.  She is now fully alert and cooperative and able to participate on general LE active exercise.  She has no pain in the L ankle.  She had previously been on bedrest per Dr. Hilda Lias pending today's xrays.  We are to use extreme caution with the L ankle due to it's fragility.  RN did not want [place] to try transfering her to a recliner, even with a full lift due to previous respiratory distress.  We will try to begin mobiliizing her tomorrow if medically appropriate. I think the best that she will be able to do is transferring bed to chair, NWB L.  She is too heavy for gait to be a goal.    Follow Up Recommendations        Does the patient have the potential to tolerate intense rehabilitation     Barriers to Discharge        Equipment Recommendations       Recommendations for Other Services    Frequency     Plan Discharge plan remains appropriate;Frequency remains appropriate    Precautions / Restrictions Precautions Precautions: Fall Restrictions Weight Bearing Restrictions: Yes LLE Weight Bearing: Non weight bearing   Pertinent Vitals/Pain     Mobility  Bed Mobility Bed Mobility: Not assessed    Exercises General Exercises - Upper Extremity Shoulder Flexion: Strengthening;Both;15 reps;Supine;Theraband Theraband Level (Shoulder Flexion): Level 2 (Red) Shoulder Extension: Strengthening;Both;15 reps;Theraband;Supine Theraband Level (Shoulder Extension): Level 2 (Red) Shoulder Horizontal ABduction: Strengthening;Both;15 reps;Supine;Theraband Theraband Level (Shoulder Horizontal Abduction): Level 2 (Red) Shoulder  Horizontal ADduction: Strengthening;Both;15 reps;Supine;Theraband Theraband Level (Shoulder Horizontal Adduction): Level 2 (Red) Elbow Flexion: Strengthening;Both;15 reps;Supine;Theraband Theraband Level (Elbow Flexion): Level 2 (Red) Elbow Extension: Strengthening;Both;15 reps;Theraband;Supine Theraband Level (Elbow Extension): Level 2 (Red) General Exercises - Lower Extremity Ankle Circles/Pumps: AROM;Right;10 reps;Supine Quad Sets: AROM;Both;10 reps;Supine Gluteal Sets: AROM;Both;10 reps;Supine Short Arc Quad: AROM;Both;10 reps;Supine Heel Slides: AROM;Both;10 reps;Supine Hip ABduction/ADduction: AROM;Both;10 reps;Supine Straight Leg Raises: AROM;Both;10 reps;Supine   PT Diagnosis:    PT Problem List:   PT Treatment Interventions:     PT Goals Acute Rehab PT Goals PT Goal: Sit at Edge Of Bed - Progress: Not progressing PT Goal: Sit to Stand - Progress: Not progressing PT Goal: Stand to Sit - Progress: Not progressing PT Transfer Goal: Bed to Chair/Chair to Bed - Progress: Not progressing PT Goal: Stand - Progress: Not progressing PT Goal: Ambulate - Progress: Not progressing  Visit Information  Last PT Received On: 11/04/12    Subjective Data  Subjective: Pt has no recollection of having PT yesterday   Cognition  Cognition Arousal/Alertness: Lethargic Behavior During Therapy: WFL for tasks assessed/performed Overall Cognitive Status: Within Functional Limits for tasks assessed    Balance  Balance Balance Assessed: No  End of Session PT - End of Session Equipment Utilized During Treatment: Oxygen;Other (comment) (Bi pap) Activity Tolerance: Patient tolerated treatment well Patient left: in bed;with call bell/phone within reach Nurse Communication: Mobility status   GP     [name] 11/04/2012, 2:28 PM

## 2012-11-05 ENCOUNTER — Inpatient Hospital Stay (HOSPITAL_COMMUNITY): Payer: Self-pay

## 2012-11-05 ENCOUNTER — Other Ambulatory Visit: Payer: Self-pay | Admitting: Internal Medicine

## 2012-11-05 DIAGNOSIS — I10 Essential (primary) hypertension: Secondary | ICD-10-CM

## 2012-11-05 DIAGNOSIS — J9620 Acute and chronic respiratory failure, unspecified whether with hypoxia or hypercapnia: Principal | ICD-10-CM

## 2012-11-05 DIAGNOSIS — G4730 Sleep apnea, unspecified: Secondary | ICD-10-CM

## 2012-11-05 DIAGNOSIS — E039 Hypothyroidism, unspecified: Secondary | ICD-10-CM

## 2012-11-05 LAB — CBC
MCH: 31.7 pg (ref 26.0–34.0)
MCHC: 31.9 g/dL (ref 30.0–36.0)
RDW: 15.1 % (ref 11.5–15.5)

## 2012-11-05 LAB — BASIC METABOLIC PANEL
Calcium: 8.8 mg/dL (ref 8.4–10.5)
GFR calc Af Amer: >90 mL/min (ref >90)
GFR calc non Af Amer: >90 mL/min (ref >90)
Potassium: 4.1 mEq/L (ref 3.5–5.1)
Sodium: 136 mEq/L (ref 135–145)

## 2012-11-05 LAB — T3, FREE: T3, Free: 1.6 pg/mL — ABNORMAL LOW (ref 2.3–4.2)

## 2012-11-05 NOTE — Consult Note (Signed)
[name], [name]             ACCOUNT NO.:  [id number]  MEDICAL RECORD NO.:  [id number]  LOCATION:  [facility]01                          FACILITY:  [facility]  PHYSICIAN:  [name], M.D.DATE OF BIRTH:  [birth year]  DATE OF CONSULTATION: DATE OF DISCHARGE:                                CONSULTATION   REASON FOR CONSULTATION:  Respiratory failure.  HISTORY OF PRESENT ILLNESS:  This is a 47 year old Caucasian female who was in her usual state of fair health at home when she fell and she suffered a fracture in her ankle.  She has a history of morbid obesity and sleep apnea.  She has been on BiPAP.  She lost a significant amount of weight and was able to come off BiPAP, but now has gained most of the weight back.  She had been admitted and did well.  Then, she developed increasing shortness of breath and was taken to the emergency room.  PAST MEDICAL HISTORY:  Positive for hypothyroidism, hypertension, hyperlipidemia, COPD, and obesity hypoventilation.  PAST SURGICAL HISTORY:  Surgically, she has had tubal ligation and ankle surgery.  MEDICATIONS:  At home, she has been taking aspirin 81 mg daily, fenofibrate 130 mg daily, hydrocodone every 4 hours as needed for pain, Synthroid 200 mcg daily, losartan/HCTZ 100/25 daily.  ALLERGIES:  She has no known allergies.  SOCIAL HISTORY:  She has about a 30-pack-year smoking history and has continued to smoke.  She does not drink any alcohol or use any illicit drugs.  FAMILY HISTORY:  Positive for heart failure in her father.  Cancer in her mother.  REVIEW OF SYSTEMS:  Really not obtainable because she is on BiPAP.  PHYSICAL EXAMINATION:  GENERAL:  She is an obese female, who is in moderate distress. HEENT:  Her pupils are reactive.  Nose and throat are clear.  Mucous membranes are moist. NECK:  Supple. CHEST:  Decreased breath sounds bilaterally. HEART:  Regular. ABDOMEN:  Soft. EXTREMITIES:  No edema. CENTRAL NERVOUS SYSTEM:   Grossly intact with a little more difficult to assess because she is on BiPAP.  ASSESSMENT:  She has respiratory failure, multifactorial.  She has obesity hypoventilation and probably has some component of chronic obstructive pulmonary disease.  She seems to be tolerating BiPAP okay. She is being worked up for sepsis.  I would plan to continue with BiPAP, continue with her other treatments.  I agree with antibiotics and she may need the addition of steroids, but I will re-evaluate that tomorrow.  Thanks for allowing me to see her with you.     Atha Muradyan L. Juanetta Kent, M.D.     ELH/MEDQ  D:  11/04/2012  T:  11/05/2012  Job:  [id number]

## 2012-11-05 NOTE — Progress Notes (Signed)
ANTIBIOTIC CONSULT NOTE   Pharmacy Consult for Zosyn Indication: UTI  No Known Allergies  Patient Measurements: Height:  (5 feet 5 inches) Weight: 352 lb 4.7 oz (159.8 kg) IBW/kg (Calculated) : 57 Adjusted Body Weight: 87Kg  Vital Signs: Temp: 98.6 F (37 C) (05/09 0745) Temp src: Oral (05/09 0745) BP: 117/72 mmHg (05/09 0800) Pulse Rate: 64 (05/09 0813) Intake/Output from previous day: 05/08 0701 - 05/09 0700 In: 3485 [P.O.:960; I.V.:2375; IV Piggyback:150] Out: 1750 [Urine:1750] Intake/Output from this shift: Total I/O In: 605 [P.O.:480; I.V.:125] Out: -   Labs:  Recent Labs  11/02/12 2344 11/04/12 0454 11/05/12 0432  WBC 9.9 7.7 7.9  HGB 14.8 14.2 13.7  PLT 240 212 215  CREATININE 0.68 0.94 0.75   Estimated Creatinine Clearance: 133.2 ml/min (by C-G formula based on Cr of 0.75). No results found for this basename: VANCOTROUGH, Leodis Binet, VANCORANDOM, GENTTROUGH, GENTPEAK, GENTRANDOM, TOBRATROUGH, TOBRAPEAK, TOBRARND, AMIKACINPEAK, AMIKACINTROU, AMIKACIN,  in the last 72 hours   Microbiology: Recent Results (from the past 720 hour(s))  MRSA PCR SCREENING     Status: None   Collection Time    11/03/12  1:13 PM      Result Value Range Status   MRSA by PCR NEGATIVE  NEGATIVE Final   Comment:            The GeneXpert MRSA Assay (FDA     approved for NASAL specimens     only), is one component of a     comprehensive MRSA colonization     surveillance program. It is not     intended to diagnose MRSA     infection nor to guide or     monitor treatment for     MRSA infections.  CULTURE, BLOOD (ROUTINE X 2)     Status: None   Collection Time    11/04/12  8:59 AM      Result Value Range Status   Specimen Description BLOOD LEFT HAND   Final   Special Requests BOTTLES DRAWN AEROBIC AND ANAEROBIC 6CC   Final   Culture NO GROWTH 1 DAY   Final   Report Status PENDING   Incomplete  CULTURE, BLOOD (ROUTINE X 2)     Status: None   Collection Time    11/04/12   9:09 AM      Result Value Range Status   Specimen Description BLOOD RIGHT HAND   Final   Special Requests BOTTLES DRAWN AEROBIC ONLY 5CC   Final   Culture NO GROWTH 1 DAY   Final   Report Status PENDING   Incomplete   Medical History: Past Medical History  Diagnosis Date  . Thyroid disease   . Hypertension   . Hyperlipidemia   . COPD (chronic obstructive pulmonary disease)    Medications:  Scheduled:  . antiseptic oral rinse  15 mL Mouth Rinse BID  . aspirin EC  81 mg Oral Daily  . enoxaparin (LOVENOX) injection  80 mg Subcutaneous Q24H  . famotidine  20 mg Oral BID  . fenofibrate  160 mg Oral Daily  . [COMPLETED] ketorolac      . levothyroxine  200 mcg Oral QAC breakfast  . piperacillin-tazobactam (ZOSYN)  IV  3.375 g Intravenous Q8H  . [COMPLETED] sodium chloride  500 mL Intravenous Once  . sodium chloride  3 mL Intravenous Q12H   Assessment: 46yo obese female with suspected UTI.  Zosyn added per MD.  Pt has good renal fxn.  Estimated Creatinine Clearance: 133.2 ml/min (by  C-G formula based on Cr of 0.75).  Goal of Therapy:  Eradicate infection.  Plan:  Zosyn 3.375gm IV q8hrs to be infused over 4 hours Monitor labs, renal fxn, and cultures  Margo Aye, Xena Propst A 11/05/2012,10:56 AM

## 2012-11-05 NOTE — Progress Notes (Signed)
Subjective: She looks much better this morning. She has no new complaints. She says she has minimal pain in her ankle. Her breathing is pretty good. She had been on CPAP or BiPAP in the past but then lost a lot of weight but unfortunately has gained weight back. She may need CPAP or BiPAP again. She probably will need a sleep study.  Objective: Vital signs in last 24 hours: Temp:  [97.8 F (36.6 C)-98.9 F (37.2 C)] 98.6 F (37 C) (05/09 0400) Pulse Rate:  [60-78] 65 (05/09 0500) Resp:  [16-26] 20 (05/09 0500) BP: (89-132)/(40-90) 100/42 mmHg (05/09 0400) SpO2:  [84 %-98 %] 95 % (05/09 0500) Weight change:  Last BM Date: 11/02/12  Intake/Output from previous day: 05/08 0701 - 05/09 0700 In: 3310 [P.O.:960; I.V.:2250; IV Piggyback:100] Out: 1750 [Urine:1750]  PHYSICAL EXAM General appearance: alert, cooperative and morbidly obese Resp: rhonchi bilaterally Cardio: regular rate and rhythm, S1, S2 normal, no murmur, click, rub or gallop GI: soft, non-tender; bowel sounds normal; no masses,  no organomegaly Extremities: extremities normal, atraumatic, no cyanosis or edema  Lab Results:    Basic Metabolic Panel:  Recent Labs  16/10/96 0454 11/05/12 0432  NA 135 136  K 4.0 4.1  CL 94* 95*  CO2 34* 34*  GLUCOSE 117* 129*  BUN 18 16  CREATININE 0.94 0.75  CALCIUM 8.7 8.8   Liver Function Tests:  Recent Labs  11/02/12 2344  AST 30  ALT 29  ALKPHOS 92  BILITOT 0.6  PROT 7.6  ALBUMIN 3.6   No results found for this basename: LIPASE, AMYLASE,  in the last 72 hours No results found for this basename: AMMONIA,  in the last 72 hours CBC:  Recent Labs  11/02/12 0934 11/02/12 2344 11/04/12 0454 11/05/12 0432  WBC 9.0 9.9 7.7 7.9  NEUTROABS 7.1 7.6  --   --   HGB 15.2* 14.8 14.2 13.7  HCT 45.8 45.1 44.7 43.0  MCV 96.4 96.6 100.4* 99.5  PLT 230 240 212 215   Cardiac Enzymes:  Recent Labs  11/02/12 2344  TROPONINI <0.30   BNP: No results found for this  basename: PROBNP,  in the last 72 hours D-Dimer:  Recent Labs  11/02/12 2344  DDIMER 2.20*   CBG: No results found for this basename: GLUCAP,  in the last 72 hours Hemoglobin A1C:  Recent Labs  11/03/12  HGBA1C 8.1*   Fasting Lipid Panel: No results found for this basename: CHOL, HDL, LDLCALC, TRIG, CHOLHDL, LDLDIRECT,  in the last 72 hours Thyroid Function Tests:  Recent Labs  11/03/12 11/04/12 1517  TSH 24.266*  --   FREET4  --  0.89  T3FREE  --  1.6*   Anemia Panel: No results found for this basename: VITAMINB12, FOLATE, FERRITIN, TIBC, IRON, RETICCTPCT,  in the last 72 hours Coagulation: No results found for this basename: LABPROT, INR,  in the last 72 hours Urine Drug Screen: Drugs of Abuse  No results found for this basename: labopia, cocainscrnur, labbenz, amphetmu, thcu, labbarb    Alcohol Level: No results found for this basename: ETH,  in the last 72 hours Urinalysis:  Recent Labs  11/04/12 1433  COLORURINE YELLOW  LABSPEC >1.030*  PHURINE 5.5  GLUCOSEU NEGATIVE  HGBUR NEGATIVE  BILIRUBINUR SMALL*  KETONESUR NEGATIVE  PROTEINUR TRACE*  UROBILINOGEN 1.0  NITRITE NEGATIVE  LEUKOCYTESUR NEGATIVE   Misc. Labs:  ABGS  Recent Labs  11/04/12 0950  PHART 7.361  PO2ART 83.0  TCO2 29.9  HCO3  33.6*   CULTURES Recent Results (from the past 240 hour(s))  MRSA PCR SCREENING     Status: None   Collection Time    11/03/12  1:13 PM      Result Value Range Status   MRSA by PCR NEGATIVE  NEGATIVE Final   Comment:            The GeneXpert MRSA Assay (FDA     approved for NASAL specimens     only), is one component of a     comprehensive MRSA colonization     surveillance program. It is not     intended to diagnose MRSA     infection nor to guide or     monitor treatment for     MRSA infections.  CULTURE, BLOOD (ROUTINE X 2)     Status: None   Collection Time    11/04/12  8:59 AM      Result Value Range Status   Specimen Description BLOOD  LEFT HAND   Final   Special Requests BOTTLES DRAWN AEROBIC AND ANAEROBIC 6CC   Final   Culture NO GROWTH 1 DAY   Final   Report Status PENDING   Incomplete  CULTURE, BLOOD (ROUTINE X 2)     Status: None   Collection Time    11/04/12  9:09 AM      Result Value Range Status   Specimen Description BLOOD RIGHT HAND   Final   Special Requests BOTTLES DRAWN AEROBIC ONLY 5CC   Final   Culture NO GROWTH 1 DAY   Final   Report Status PENDING   Incomplete   Studies/Results: Dg Ankle 2 Views Left  11/04/2012  *RADIOLOGY REPORT*  Clinical Data: Trimalleolar fracture of the left ankle.  LEFT ANKLE - 2 VIEW  Comparison: Radiographs dated 11/02/2012  Findings: A splint has been applied.  There is less displacement of the distal fibula fracture on the lateral view.  No change in the minimally displaced medial malleolar fracture.  No change in the posterior distal tibia fracture.  IMPRESSION: Slight improvement in displacement of the distal fibula fracture.   Original Report Authenticated By: Francene Boyers, M.D.     Medications:  Prior to Admission:  Prescriptions prior to admission  Medication Sig Dispense Refill  . aspirin EC 81 MG tablet Take 81 mg by mouth daily.      . fenofibrate micronized (ANTARA) 130 MG capsule Take 130 mg by mouth daily.      Marland Kitchen HYDROcodone-acetaminophen (NORCO/VICODIN) 5-325 MG per tablet Take 1 tablet by mouth every 4 (four) hours as needed for pain.  15 tablet  0  . levothyroxine (SYNTHROID, LEVOTHROID) 200 MCG tablet Take 200 mcg by mouth daily.      Marland Kitchen losartan-hydrochlorothiazide (HYZAAR) 100-25 MG per tablet Take 1 tablet by mouth daily.       Scheduled: . antiseptic oral rinse  15 mL Mouth Rinse BID  . aspirin EC  81 mg Oral Daily  . enoxaparin (LOVENOX) injection  80 mg Subcutaneous Q24H  . famotidine  20 mg Oral BID  . fenofibrate  160 mg Oral Daily  . levothyroxine  200 mcg Oral QAC breakfast  . piperacillin-tazobactam (ZOSYN)  IV  3.375 g Intravenous Q8H  .  sodium chloride  3 mL Intravenous Q12H   Continuous: . sodium chloride 125 mL/hr at 11/05/12 0500   ZDG:[id number], bisacodyl, ketorolac, ondansetron (ZOFRAN) IV  Assesment: She was admitted with trimalleolar fracture of the ankle. This apparently is not  going to require surgery. She had respiratory failure which I think is multifactorial and includes some element of COPD but she also almost certainly has obesity hypoventilation syndrome. She is much improved this morning. She has hypothyroidism being treated by Dr. Karilyn Cota Active Problems:   Trimalleolar fracture of ankle, closed   Obesity hypoventilation syndrome   Hypoxia   Morbid obesity   Essential hypertension, benign   Other and unspecified hyperlipidemia   Unspecified hypothyroidism   Tobacco abuse   Hypotension, unspecified   Acute and chronic respiratory failure    Plan: Continue current treatments. She may need CPAP or BiPAP at home.    LOS: 3 days   Marcelia Petersen L 11/05/2012, 7:42 AM

## 2012-11-05 NOTE — Care Management Note (Unsigned)
    Page 1 of 2   11/05/2012     2:17:37 PM   CARE MANAGEMENT NOTE 11/05/2012  Patient:  [name], [name]   Account Number:  [id number]  Date Initiated:  11/05/2012  Documentation initiated by:  Sharrie Rothman  Subjective/Objective Assessment:   Pt admitted from home with ankle fracture. Pt lives with her boyfriend and pt will return home at discharge. Pt has a wheelchair and someone is building a ramp.     Action/Plan:   Pt will need HH RN at discharge. Weekend staff will need to assess for home O2 prior to discharge. Pt also will need cpap. Financial counselor aware of pts self pay status.   Anticipated DC Date:  11/08/2012   Anticipated DC Plan:  HOME W HOME HEALTH SERVICES  In-house referral  Financial Counselor      DC Planning Services  CM consult      [facility] Choice  HOME HEALTH  DURABLE MEDICAL EQUIPMENT   Choice offered to / List presented to:  C-1 Patient   DME arranged  [facility]  CPAP      DME agency  [facility]     [facility] arranged  HH-1 RN      [facility] agency  [facility]   Status of service:  Completed, signed off Medicare Important Message given?   (If response is "NO", the following Medicare IM given date fields will be blank) Date Medicare IM given:   Date Additional Medicare IM given:    Discharge Disposition:    Per UR Regulation:    If discussed at Long Length of Stay Meetings, dates discussed:    Comments:  11/05/12 1415 Arlyss Queen, RN BSN CM Alroy Bailiff and [place] [facility] is aware of discharge needs. Weekend staff will arrange O2 if needed and fax orders to [facility] once written. Pt will also need [facility] voucher for help with meds.

## 2012-11-05 NOTE — Progress Notes (Signed)
Physical Therapy Treatment Patient Details Name: [name] MRN: [id number] DOB: Feb 25, 1966 Today's Date: 11/05/2012 Time: 0865-7846 PT Time Calculation (min): 33 min  PT Assessment / Plan / Recommendation Comments on Treatment Session  Pt's respiratory status is now much improved and she is on nasal cannula.  Her bed mobility is very good, able to assume sitting at EOB without much assist.  She is able to stand to a walker and pivot to [facility] or chair with no physical assist, maintaining true NWB to TTWB LLE.  She definately should be able to transition to home at d/c, but will need a ramp built at entrance of her home...apparently this is currently being done.    Follow Up Recommendations        Does the patient have the potential to tolerate intense rehabilitation     Barriers to Discharge        Equipment Recommendations  Standard walker (BSC with removable armrest)    Recommendations for Other Services    Frequency Min 6X/week   Plan Discharge plan remains appropriate;Frequency needs to be updated;Equipment recommendations need to be updated    Precautions / Restrictions Restrictions LLE Weight Bearing: Non weight bearing   Pertinent Vitals/Pain     Mobility  Bed Mobility Supine to Sit: 6: Modified independent (Device/Increase time);HOB flat Transfers Transfers: Sit to Stand;Stand to Sit;Stand Pivot Transfers Sit to Stand: 5: Supervision;From bed;From chair/3-in-1;With upper extremity assist Stand to Sit: 5: Supervision;To chair/3-in-1;With upper extremity assist Stand Pivot Transfers: 5: Supervision (using a walker) Details for Transfer Assistance: pt was able to maintain excellent NWB-TTWB status on LLE Ambulation/Gait Ambulation/Gait Assistance: Not tested (comment)    Exercises     PT Diagnosis:    PT Problem List:   PT Treatment Interventions:     PT Goals Acute Rehab PT Goals PT Goal: Sit at Edge Of Bed - Progress: Met PT Goal: Sit to Stand -  Progress: Met PT Goal: Stand to Sit - Progress: Met Pt will Transfer Bed to Chair/Chair to Bed: with modified independence PT Transfer Goal: Bed to Chair/Chair to Bed - Progress: Updated due to goal met PT Goal: Stand - Progress: Met PT Goal: Ambulate - Progress: Discontinued (comment) (not appropriate at this time.)  Visit Information  Last PT Received On: 11/05/12 PT/OT Co-Evaluation/Treatment: Yes    Subjective Data  Subjective: feels much better today   Cognition       Balance     End of Session PT - End of Session Equipment Utilized During Treatment: Gait belt;Oxygen (O2 at 4L/min) Activity Tolerance: Patient tolerated treatment well Patient left: in chair;with call bell/phone within reach;with family/visitor present Nurse Communication: Mobility status   GP     [name] 11/05/2012, 12:05 PM

## 2012-11-05 NOTE — Progress Notes (Signed)
Patient was very non compliant in wearing her BIPAP this evening. I tried to fix the mask and make her comfortable on several occasions and she continues to move excessively and readjusting the mask after fixing the mask to maintain her proper pressures. Patient was placed back on her nasal cannula and will continue to be monitored.

## 2012-11-05 NOTE — Progress Notes (Signed)
[facility] PROGRESS NOTE  [name] WJX:[id number] DOB: Aug 21, 1965 DOA: 11/02/2012 PCP: Dwana Melena, MD  Assessment/Plan: 1- Acute Respiratory Failure, Hypoxic, Hypercapnia: She is improving. Etiology likely to be a combination of COPD and obesity hypoventilation syndrome. 2-Hypotension-improved. Discontinue IV fluids. Renal function is normal. 3-hyperlipidemia: continue with fenofibrate.  4-hypothyroidism:  TSH elevated at 24. She says that she has not been compliant with her Synthroid so for the time being, I will not change the dose. 5-Trimalleolar fractures left ankle: ortho following. Patient high risk for surgery. No plans for surgery at the present time.     Code Status: Full  Family Communication: Care discussed with father who was at bedside.  Disposition Plan: Patient can move to the medical floor.   Consultants:  Ortho.  Pulmonary.   Procedures:  none  Antibiotics:  none  HPI/Subjective: Answering question, fall sleep easy. She denies chest pain, dyspnea, abdominal pain, no cough. No diarrhea.  Per nurse patient had BM day prior to admission.   Objective: Filed Vitals:   11/05/12 0300 11/05/12 0400 11/05/12 0500 11/05/12 0745  BP: 129/90 100/42    Pulse: 64 61 65   Temp:  98.6 F (37 C)  98.6 F (37 C)  TempSrc:  Axillary  Oral  Resp: 16 17 20    Height:      Weight:      SpO2: 89% 95% 95%     Intake/Output Summary (Last 24 hours) at 11/05/12 0754 Last data filed at 11/05/12 0500  Gross per 24 hour  Intake   3310 ml  Output   1750 ml  Net   1560 ml   Filed Weights   11/02/12 2340 11/03/12 0335 11/03/12 1145  Weight: 136.079 kg (300 lb) 159.8 kg (352 lb 4.7 oz) 159.8 kg (352 lb 4.7 oz)    Exam:   General: awake, eating breakfast, fall sleep.   Cardiovascular: S 1, S 2 RRR  Respiratory: No wheezes, no crackles.   Abdomen: obese, NT No rigidity.   Musculoskeletal: left LE with dressing.   Data Reviewed: Basic Metabolic  Panel:  Recent Labs Lab 11/02/12 0934 11/02/12 2344 11/04/12 0454 11/05/12 0432  NA 135 135 135 136  K 3.9 3.9 4.0 4.1  CL 94* 94* 94* 95*  CO2 36* 33* 34* 34*  GLUCOSE 143* 140* 117* 129*  BUN 10 13 18 16   CREATININE 0.54 0.68 0.94 0.75  CALCIUM 9.2 9.2 8.7 8.8   Liver Function Tests:  Recent Labs Lab 11/02/12 2344  AST 30  ALT 29  ALKPHOS 92  BILITOT 0.6  PROT 7.6  ALBUMIN 3.6     CBC:  Recent Labs Lab 11/02/12 0934 11/02/12 2344 11/04/12 0454 11/05/12 0432  WBC 9.0 9.9 7.7 7.9  NEUTROABS 7.1 7.6  --   --   HGB 15.2* 14.8 14.2 13.7  HCT 45.8 45.1 44.7 43.0  MCV 96.4 96.6 100.4* 99.5  PLT 230 240 212 215   Cardiac Enzymes:  Recent Labs Lab 11/02/12 2344  TROPONINI <0.30      Recent Results (from the past 240 hour(s))  MRSA PCR SCREENING     Status: None   Collection Time    11/03/12  1:13 PM      Result Value Range Status   MRSA by PCR NEGATIVE  NEGATIVE Final   Comment:            The GeneXpert MRSA Assay (FDA     approved for NASAL specimens     only),  is one component of a     comprehensive MRSA colonization     surveillance program. It is not     intended to diagnose MRSA     infection nor to guide or     monitor treatment for     MRSA infections.  CULTURE, BLOOD (ROUTINE X 2)     Status: None   Collection Time    11/04/12  8:59 AM      Result Value Range Status   Specimen Description BLOOD LEFT HAND   Final   Special Requests BOTTLES DRAWN AEROBIC AND ANAEROBIC 6CC   Final   Culture NO GROWTH 1 DAY   Final   Report Status PENDING   Incomplete  CULTURE, BLOOD (ROUTINE X 2)     Status: None   Collection Time    11/04/12  9:09 AM      Result Value Range Status   Specimen Description BLOOD RIGHT HAND   Final   Special Requests BOTTLES DRAWN AEROBIC ONLY 5CC   Final   Culture NO GROWTH 1 DAY   Final   Report Status PENDING   Incomplete     Studies: Dg Ankle 2 Views Left  11/04/2012  *RADIOLOGY REPORT*  Clinical Data:  Trimalleolar fracture of the left ankle.  LEFT ANKLE - 2 VIEW  Comparison: Radiographs dated 11/02/2012  Findings: A splint has been applied.  There is less displacement of the distal fibula fracture on the lateral view.  No change in the minimally displaced medial malleolar fracture.  No change in the posterior distal tibia fracture.  IMPRESSION: Slight improvement in displacement of the distal fibula fracture.   Original Report Authenticated By: Francene Boyers, M.D.     Scheduled Meds: . antiseptic oral rinse  15 mL Mouth Rinse BID  . aspirin EC  81 mg Oral Daily  . enoxaparin (LOVENOX) injection  80 mg Subcutaneous Q24H  . famotidine  20 mg Oral BID  . fenofibrate  160 mg Oral Daily  . levothyroxine  200 mcg Oral QAC breakfast  . piperacillin-tazobactam (ZOSYN)  IV  3.375 g Intravenous Q8H  . sodium chloride  3 mL Intravenous Q12H   Continuous Infusions:    Active Problems:   Trimalleolar fracture of ankle, closed   Obesity hypoventilation syndrome   Hypoxia   Morbid obesity   Essential hypertension, benign   Other and unspecified hyperlipidemia   Unspecified hypothyroidism   Tobacco abuse   Hypotension, unspecified   Acute and chronic respiratory failure    Time spent: 35 minutes.    Wilson Singer  [facility] Pager [phone]. If 7PM-7AM, please contact night-coverage at [email or web address], password [facility] 11/05/2012, 7:54 AM  LOS: 3 days

## 2012-11-05 NOTE — Progress Notes (Signed)
PT Cancellation Note  Patient Details Name: [name] MRN: [id number] DOB: Sep 01, 1965   Cancelled Treatment:    Reason Eval/Treat Not Completed: Other (comment) Pt states that she has been transferring back and forth to the [facility] using the walker and nurse just standing by.  I spoke with her nurse, Arline Asp who confirmed that this was true.  We will plan to see her in the AM and offer our services if needed.  I supplied a standard walker for her to use while here in the hospital... It will provide more stability.

## 2012-11-05 NOTE — Progress Notes (Signed)
Subjective: I feel better   Objective: Vital signs in last 24 hours: Temp:  [97.8 F (36.6 C)-98.9 F (37.2 C)] 98.6 F (37 C) (05/09 0400) Pulse Rate:  [60-78] 65 (05/09 0500) Resp:  [16-26] 20 (05/09 0500) BP: (89-132)/(40-90) 100/42 mmHg (05/09 0400) SpO2:  [84 %-98 %] 95 % (05/09 0500)  Intake/Output from previous day: 05/08 0701 - 05/09 0700 In: 3310 [P.O.:960; I.V.:2250; IV Piggyback:100] Out: 1750 [Urine:1750] Intake/Output this shift:     Recent Labs  11/02/12 0934 11/02/12 2344 11/04/12 0454 11/05/12 0432  HGB 15.2* 14.8 14.2 13.7    Recent Labs  11/04/12 0454 11/05/12 0432  WBC 7.7 7.9  RBC 4.45 4.32  HCT 44.7 43.0  PLT 212 215    Recent Labs  11/04/12 0454 11/05/12 0432  NA 135 136  K 4.0 4.1  CL 94* 95*  CO2 34* 34*  BUN 18 16  CREATININE 0.94 0.75  GLUCOSE 117* 129*  CALCIUM 8.7 8.8   No results found for this basename: LABPT, INR,  in the last 72 hours  Neurologically intact Neurovascular intact Sensation intact distally Intact pulses distally  She is alert now.  She was told of the fracture of the left ankle and need to avoid weight bearing on it.  It still might need surgery if it "slips."  I will have therapy get her up out of bed.  Assessment/Plan: Trimalleolar fracture of the left ankle, nondisplaced, in posterior splint.   [name] 11/05/2012, 7:19 AM

## 2012-11-05 NOTE — Progress Notes (Signed)
Patient was placed back on BIPAP after unable to maintain saturations. I explained again the importance to patient to leave mask in place and try not to move mask around to much.

## 2012-11-05 NOTE — Progress Notes (Signed)
PT TRANSFERING TO ROOM 337 AS MED/SURG PT. REPORT CALLED TO LEAANN RN.PT ALERT AND ORIENTED. O2 SAT 93%0N O2 AT 4.5L/MIN. LT WRIST NSL PATENT. LT FOOT IN ACEWRAP WARM W/ GOOD CAPILLARY REFILL.

## 2012-11-05 NOTE — Progress Notes (Signed)
Occupational Therapy Treatment Patient Details Name: [name] MRN: [id number] DOB: Jun 25, 1966 Today's Date: 11/05/2012 Time: 7829-5621 OT Time Calculation (min): 33 min  OT Assessment / Plan / Recommendation Comments on Treatment Session Patient finishing breakfast upon arrival. Patient was awake and alert and ready to get out of bed. Patient unable to use bedpan and Nursing and PT were able to locate wide bedside commode for patient. Patient was able to transfer from bed to commode and to recliner with set-up/supervision. Patient should be able to discharge home with assist from significant other.    Follow Up Recommendations  No OT follow up       Equipment Recommendations  3 in 1 bedside comode (BSC with drop arms)          Plan Frequency remains appropriate;Discharge plan needs to be updated    Precautions / Restrictions Precautions Precautions: Fall Restrictions Weight Bearing Restrictions: Yes LLE Weight Bearing: Touchdown weight bearing   Pertinent Vitals/Pain No complaints.    ADL  Toilet Transfer: Performed;Set up Toilet Transfer Method: Stand pivot Toilet Transfer Equipment: Extra wide bedside commode Toileting - Clothing Manipulation and Hygiene: Performed;+1 Total assistance Where Assessed - Toileting Clothing Manipulation and Hygiene: Sit to stand from 3-in-1 or toilet Transfers/Ambulation Related to ADLs: Patient performed stand pivot transfer with RW with Set-up. Able to follow WB status LLE.      OT Goals ADL Goals Pt Will Transfer to Toilet: with min assist;Maintaining weight bearing status ADL Goal: Toilet Transfer - Progress: Met  Visit Information  Last OT Received On: 11/05/12 Assistance Needed: +1 PT/OT Co-Evaluation/Treatment: Yes    Subjective Data  Subjective: S: I don't remember you coming in here yesterday.  Patient Stated Goal: to go home.      Cognition  Cognition Arousal/Alertness: Awake/alert Behavior During Therapy: WFL  for tasks assessed/performed Overall Cognitive Status: Within Functional Limits for tasks assessed    Mobility  Bed Mobility Supine to Sit: 6: Modified independent (Device/Increase time);HOB flat Transfers Sit to Stand: 5: Supervision;From bed;From chair/3-in-1;With upper extremity assist Stand to Sit: 5: Supervision;To chair/3-in-1;With upper extremity assist Details for Transfer Assistance: pt was able to maintain excellent NWB-TTWB status on LLE          End of Session OT - End of Session Equipment Utilized During Treatment: Gait belt Activity Tolerance: Patient tolerated treatment well Patient left: in chair;with call bell/phone within reach;with family/visitor present Nurse Communication: Mobility status;Weight bearing status    Limmie Patricia, OTR/L,CBIS   11/05/2012, 1:08 PM

## 2012-11-05 NOTE — Progress Notes (Signed)
I come to place pt on BIPAP for the night pt c/o of room being to hot. I adjust the temperature in room and tried to open window unsuccessful. I ask pt if she was ready to put BIPAP on and she said that she wasn't ready and would have nurse call when she was ready. Upon reading last night therapist note I see where pt wasn't complaint with wearing the BIPAP so I will give her an hour to cool off and check back with her about wearing her machine tonight.

## 2012-11-06 DIAGNOSIS — S8290XD Unspecified fracture of unspecified lower leg, subsequent encounter for closed fracture with routine healing: Secondary | ICD-10-CM

## 2012-11-06 LAB — CBC
MCHC: 31.8 g/dL (ref 30.0–36.0)
MCV: 98.4 fL (ref 78.0–100.0)
Platelets: 222 10*3/uL (ref 150–400)
RDW: 14.5 % (ref 11.5–15.5)
WBC: 7.2 10*3/uL (ref 4.0–10.5)

## 2012-11-06 LAB — COMPREHENSIVE METABOLIC PANEL
AST: 20 U/L (ref 0–37)
Albumin: 3.3 g/dL — ABNORMAL LOW (ref 3.5–5.2)
BUN: 9 mg/dL (ref 6–23)
Chloride: 95 mEq/L — ABNORMAL LOW (ref 96–112)
Creatinine, Ser: 0.64 mg/dL (ref 0.50–1.10)
Potassium: 4.3 mEq/L (ref 3.5–5.1)
Total Bilirubin: 0.6 mg/dL (ref 0.3–1.2)
Total Protein: 7.4 g/dL (ref 6.0–8.3)

## 2012-11-06 LAB — PROCALCITONIN: Procalcitonin: <0.1 ng/mL

## 2012-11-06 LAB — URINE CULTURE

## 2012-11-06 MED ORDER — HYDROCHLOROTHIAZIDE 25 MG PO TABS
25.0000 mg | ORAL_TABLET | Freq: Every day | ORAL | Status: DC
  Administered 2012-11-06: 25 mg via ORAL
  Filled 2012-11-06: qty 1

## 2012-11-06 MED ORDER — LOSARTAN POTASSIUM-HCTZ 100-25 MG PO TABS: 1.0000 | ORAL_TABLET | Freq: Every day | ORAL | Status: DC

## 2012-11-06 MED ORDER — LOSARTAN POTASSIUM 50 MG PO TABS
100.0000 mg | ORAL_TABLET | Freq: Every day | ORAL | Status: DC
  Administered 2012-11-06: 100 mg via ORAL
  Filled 2012-11-06: qty 2

## 2012-11-06 MED ORDER — HYDROCODONE-ACETAMINOPHEN 5-325 MG PO TABS: 1.0000 | ORAL_TABLET | ORAL | Status: DC | PRN

## 2012-11-06 NOTE — Discharge Summary (Signed)
Physician Discharge Summary  [name] ZOX:[id number] DOB: 1966/02/28 DOA: 11/02/2012  PCP: Dwana Melena, MD  Admit date: 11/02/2012 Discharge date: 11/06/2012  Time spent: Greater than 30 minutes  Recommendations for Outpatient Follow-up:  1. Followup with Dr. Hilda Lias, orthopedics in 2 weeks. 2. Followup with home health care. 3. Sleep study.   Discharge Diagnoses:  Active Problems:   Trimalleolar fracture of ankle, closed   Obesity hypoventilation syndrome   Hypoxia   Morbid obesity   Essential hypertension, benign   Other and unspecified hyperlipidemia   Unspecified hypothyroidism   Tobacco abuse   Hypotension, unspecified   Acute and chronic respiratory failure   Discharge Condition: Stable.  Diet recommendation: Carbohydrate modified diet.  Filed Weights   11/02/12 2340 11/03/12 0335 11/03/12 1145  Weight: 136.079 kg (300 lb) 159.8 kg (352 lb 4.7 oz) 159.8 kg (352 lb 4.7 oz)    History of present illness:  This 47 year old lady presented to the hospital with symptoms of hypoxia. She actually had a mechanical fall and fractured her left ankle but was found to be hypoxic and soon requiring BiPAP. Please see initial history as outlined below : [name] is an 47 y.o. female. Morbidly obese Caucasian lady, diagnosed with severe obstructive sleep apnea in 2007, placed on home BiPAP 23/10; she reports that she lost weight and after a couple years no longer needed the BiPAP. The records show that her weight at the time of the sleep study was 280 pounds.  She reports she began to gain weight again over the past 4 years after the death of her husband, and now has severe daytime somnolence.  She came to the emergency room earlier today because of mechanical fall and was found to have her try a malleolus fracture of the left ankle. She was noted to be markedly hypoxic but refused admission and left AMA. On arrival at home she found she could not negotiate entry steps into  the home because of a fractured and return to the emergency room where the hospitalist service was called for admission.  Of interest she was admitted to the orthopedic service in March 2008 for trimalleolar fracture of the Right and the hospitalist service was consulted to assist with management of her obesity hyperventilation syndrome.  [facility] Course:  Patient was admitted and soon went to the step down unit soon after admission because she had type II respiratory failure. She was given BiPAP. This improved her. She was seen by pulmonology, Dr. Juanetta Gosling who recommended that she needs an outpatient sleep study as he feels that she would be a candidate for CPAP at home once again. In view of her left ankle fracture, she was seen by Dr. Hilda Lias, orthopedics. He felt that the alignment was good and that this should heal by itself. She should not bear weight on it. She will need to followup with him. She is now stable for discharge as her respiratory failure seemed to significantly improve. There was no obvious precipitating event. There was no evidence of pneumonia clinically. X-ray of her chest was not of good quality.  Procedures:  None.   Consultations:  Orthopedics, Dr. Hilda Lias.  Pulmonology, Dr. Juanetta Gosling.  Discharge Exam: Filed Vitals:   11/05/12 1847 11/05/12 2136 11/06/12 0605 11/06/12 0814  BP: 122/72 119/69 138/59   Pulse: 75 73 52   Temp: 97.2 F (36.2 C) 97.7 F (36.5 C) 97.6 F (36.4 C)   TempSrc: Oral Oral Oral   Resp: 20 20 18  Height:      Weight:      SpO2: 95% 93% 94% 95%    General: She looks systemically well. She is morbidly obese. She is more alert now. Cardiovascular: Heart sounds are present and normal without murmurs. Jugular venous pressure not elevated. Respiratory: Lung fields are clinically clear without evidence of crackles, wheezing or bronchial breathing. She is alert and orientated.  Discharge Instructions  Discharge Orders   Future  Appointments Provider Department Dept Phone   11/17/2012 8:30 PM [name] [phone]   Future Orders Complete By Expires     Diet - low sodium heart healthy  As directed     Increase activity slowly  As directed         Medication List    TAKE these medications       aspirin EC 81 MG tablet  Take 81 mg by mouth daily.     fenofibrate micronized 130 MG capsule  Commonly known as:  ANTARA  Take 130 mg by mouth daily.     HYDROcodone-acetaminophen 5-325 MG per tablet  Commonly known as:  NORCO/VICODIN  Take 1 tablet by mouth every 4 (four) hours as needed for pain.     levothyroxine 200 MCG tablet  Commonly known as:  SYNTHROID, LEVOTHROID  Take 200 mcg by mouth daily.     losartan-hydrochlorothiazide 100-25 MG per tablet  Commonly known as:  HYZAAR  Take 1 tablet by mouth daily.       No Known Allergies     Follow-up Information   Follow up with [facility].   Contact information:   [address] [place] [address] [phone]      Follow up On 11/17/2012. (at 8PM)    Contact information:   [name]      Follow up with Darreld Mclean, MD. Schedule an appointment as soon as possible for a visit in 2 weeks.   Contact information:   [address] [place] [address] [phone]        The results of significant diagnostics from this hospitalization (including imaging, microbiology, ancillary and laboratory) are listed below for reference.    Significant Diagnostic Studies: Dg Ankle 2 Views Left  11/04/2012  *RADIOLOGY REPORT*  Clinical Data: Trimalleolar fracture of the left ankle.  LEFT ANKLE - 2 VIEW  Comparison: Radiographs dated 11/02/2012  Findings: A splint has been applied.  There is less displacement of the distal fibula fracture on the lateral view.  No change in the minimally displaced medial malleolar fracture.  No change in the posterior distal tibia fracture.   IMPRESSION: Slight improvement in displacement of the distal fibula fracture.   Original Report Authenticated By: Francene Boyers, M.D.    Dg Ankle Complete Left  11/02/2012  *RADIOLOGY REPORT*  Clinical Data: Left ankle pain post fall this morning  LEFT ANKLE COMPLETE - 3+ VIEW  Comparison: None  Findings: Marked soft tissue swelling. Widening of medial ankle mortise. Oblique medial malleolar fracture, nondisplaced. Oblique lateral malleolar fracture, mildly displaced posteriorly and medially. Minimally displaced posterior malleolar fracture fragment. No dislocation or bone destruction. Large plantar calcaneal spur. Numerous soft tissue calcifications within distal lower leg.  IMPRESSION: Trimalleolar fractures left ankle.   Original Report Authenticated By: Ulyses Southward, M.D.    Ct Angio Chest Pe W/cm &/or Wo Cm  11/03/2012  *RADIOLOGY REPORT*  Clinical Data: 47 year old female with shortness of breath, hypoxia.  Recent ankle fracture.  CT ANGIOGRAPHY CHEST  Technique:  Multidetector CT imaging of the chest using the standard protocol during bolus administration of intravenous contrast. Multiplanar reconstructed images including MIPs were obtained and reviewed to evaluate the vascular anatomy.  Contrast: OMNIPAQUE IOHEXOL 350 MG/ML SOLN  Comparison: 09/18/2006.  Findings: Suboptimal contrast bolus timing in the pulmonary arterial tree.  No central pulmonary artery filling defect.  Motion artifact at the lung bases.  No convincing pulmonary artery filling defect identified to suggest acute pulmonary embolus.  Increased mediastinal lipomatosis.  Mild cardiomegaly.  No pericardial effusion.  Atherosclerosis of the aortic arch and great vessel origins, otherwise negative visualized aorta.  Stable mediastinal lymph nodes.  Negative visualized thoracic inlet.  Major airways are patent.  Chronic central lobular upper lobe emphysema, moderate to severe.  Dependent pulmonary atelectasis. No consolidation. No pleural  effusions.  Chronic posterior rib fractures more numerous on the left. No acute osseous abnormality identified.  Cholelithiasis with multiple rim calcified dependent gallstones. Decreased density throughout the visualized liver compatible with steatosis.  Negative visualized spleen and pancreas.  There is a large low-density (18 HU) rim calcified lesion occupying the area of the right adrenal gland, measuring up to 67 mm diameter. This is not included on the prior study.  This does not appear to be associated with the right kidney. No normal right adrenal gland is visible.  The left adrenal gland is normal.  No upper abdominal lymphadenopathy.  IMPRESSION: 1.  Suboptimal contrast bolus timing but no convincing evidence of acute pulmonary embolus. 2.  Emphysema with lung base atelectasis. 3. Cystic rim calcified mass occupying the area of the right adrenal gland.  Favor sequelae of prior adrenal hemorrhage.  Other etiologies (adrenal neoplasm) less likely. 4.  Hepatic steatosis.  Cholelithiasis.   Original Report Authenticated By: Erskine Speed, M.D.    Dg Chest Port 1 View  11/05/2012  *RADIOLOGY REPORT*  Clinical Data: Respiratory failure.  PORTABLE CHEST - 1 VIEW  Comparison: 11/03/2012 CT and chest x-ray.  Findings: Appearance of interval development of consolidation mid to lower lung zones bilaterally greater on the right.  Patient's habitus and portable technique may partially contribute to this appearance.  Underlying pulmonary vascular congestion/pulmonary edema.  Cardiomegaly.  Limited evaluation of the mediastinal structures.  IMPRESSION: Significant change when compared to the prior examination.  Please see above discussion.   Original Report Authenticated By: Lacy Duverney, M.D.    Dg Chest Portable 1 View  11/03/2012  *RADIOLOGY REPORT*  Clinical Data: 47 year old female hypoxemia.  Ankle injury.  PORTABLE CHEST - 1 VIEW  Comparison: 11/02/2012 and earlier.  Findings: Portable upright AP view 1125  hours.  Stable lung volumes.  Stable perihilar and basilar linear opacity.  Interval decrease vascular congestion.  No pneumothorax, pleural effusion or acute pulmonary opacity identified.  Stable cardiac size and mediastinal contours.  Visualized tracheal air column is within normal limits.  IMPRESSION: Interval resolved vascular congestion.  Mild bibasilar streaky opacity felt related to chronic lung disease. No superimposed acute findings are identified.   Original Report Authenticated By: Erskine Speed, M.D.    Dg Chest Port 1 View  11/02/2012  *RADIOLOGY REPORT*  Clinical Data: Shortness of breath  PORTABLE CHEST - 1 VIEW  Comparison: 09/18/2006  Findings: Cardiomediastinal silhouette is stable.  Mild perihilar increased interstitial markings suspicious for mild interstitial edema.  No focal infiltrate.  IMPRESSION: Mild bilateral perihilar interstitial prominence suspicious for mild edema.  No segmental infiltrate.   Original Report Authenticated By:  Natasha Mead, M.D.     Microbiology: Recent Results (from the past 240 hour(s))  MRSA PCR SCREENING     Status: None   Collection Time    11/03/12  1:13 PM      Result Value Range Status   MRSA by PCR NEGATIVE  NEGATIVE Final   Comment:            The GeneXpert MRSA Assay (FDA     approved for NASAL specimens     only), is one component of a     comprehensive MRSA colonization     surveillance program. It is not     intended to diagnose MRSA     infection nor to guide or     monitor treatment for     MRSA infections.  CULTURE, BLOOD (ROUTINE X 2)     Status: None   Collection Time    11/04/12  8:59 AM      Result Value Range Status   Specimen Description BLOOD LEFT HAND   Final   Special Requests BOTTLES DRAWN AEROBIC AND ANAEROBIC 6CC   Final   Culture NO GROWTH 2 DAYS   Final   Report Status PENDING   Incomplete  CULTURE, BLOOD (ROUTINE X 2)     Status: None   Collection Time    11/04/12  9:09 AM      Result Value Range Status    Specimen Description BLOOD RIGHT HAND   Final   Special Requests BOTTLES DRAWN AEROBIC ONLY 5CC   Final   Culture NO GROWTH 2 DAYS   Final   Report Status PENDING   Incomplete  URINE CULTURE     Status: None   Collection Time    11/04/12  2:33 PM      Result Value Range Status   Specimen Description URINE, CLEAN CATCH   Final   Special Requests NONE   Final   Culture  Setup Time 11/04/2012 18:47   Final   Colony Count 80,000 COLONIES/ML   Final   Culture     Final   Value: Multiple bacterial morphotypes present, none predominant. Suggest appropriate recollection if clinically indicated.   Report Status 11/06/2012 FINAL   Final     Labs: Basic Metabolic Panel:  Recent Labs Lab 11/02/12 0934 11/02/12 2344 11/04/12 0454 11/05/12 0432 11/06/12 0635  NA 135 135 135 136 138  K 3.9 3.9 4.0 4.1 4.3  CL 94* 94* 94* 95* 95*  CO2 36* 33* 34* 34* 36*  GLUCOSE 143* 140* 117* 129* 141*  BUN 10 13 18 16 9   CREATININE 0.54 0.68 0.94 0.75 0.64  CALCIUM 9.2 9.2 8.7 8.8 9.0   Liver Function Tests:  Recent Labs Lab 11/02/12 2344 11/06/12 0635  AST 30 20  ALT 29 20  ALKPHOS 92 82  BILITOT 0.6 0.6  PROT 7.6 7.4  ALBUMIN 3.6 3.3*     CBC:  Recent Labs Lab 11/02/12 0934 11/02/12 2344 11/04/12 0454 11/05/12 0432 11/06/12 0635  WBC 9.0 9.9 7.7 7.9 7.2  NEUTROABS 7.1 7.6  --   --   --   HGB 15.2* 14.8 14.2 13.7 13.5  HCT 45.8 45.1 44.7 43.0 42.4  MCV 96.4 96.6 100.4* 99.5 98.4  PLT 230 240 212 215 222   Cardiac Enzymes:  Recent Labs Lab 11/02/12 2344  TROPONINI <0.30         Signed:  Wilson Singer  [facility] 11/06/2012, 11:27 AM

## 2012-11-06 NOTE — Progress Notes (Signed)
Daily weight due. Pt's bed needs to be reset. Pt needs to stand to reset bed and pt is in bed eyes closed at this time. Will get day shift to follow up on daily weight.

## 2012-11-06 NOTE — Progress Notes (Signed)
ANTIBIOTIC CONSULT NOTE   Pharmacy Consult for Zosyn Indication: UTI  No Known Allergies  Patient Measurements: Height:  (5 feet 5 inches) Weight: 352 lb 4.7 oz (159.8 kg) IBW/kg (Calculated) : 57 Adjusted Body Weight: 87Kg  Vital Signs: Temp: 97.6 F (36.4 C) (05/10 0605) Temp src: Oral (05/10 0605) BP: 138/59 mmHg (05/10 0605) Pulse Rate: 52 (05/10 0605) Intake/Output from previous day: 05/09 0701 - 05/10 0700 In: 1185 [P.O.:960; I.V.:125; IV Piggyback:100] Out: 1600 [Urine:1600] Intake/Output from this shift: Total I/O In: 50 [IV Piggyback:50] Out: -   Labs:  Recent Labs  11/04/12 0454 11/05/12 0432 11/06/12 0635  WBC 7.7 7.9 7.2  HGB 14.2 13.7 13.5  PLT 212 215 222  CREATININE 0.94 0.75 0.64   Estimated Creatinine Clearance: 133.2 ml/min (by C-G formula based on Cr of 0.64). No results found for this basename: VANCOTROUGH, Leodis Binet, VANCORANDOM, GENTTROUGH, GENTPEAK, GENTRANDOM, TOBRATROUGH, TOBRAPEAK, TOBRARND, AMIKACINPEAK, AMIKACINTROU, AMIKACIN,  in the last 72 hours   Microbiology: Recent Results (from the past 720 hour(s))  MRSA PCR SCREENING     Status: None   Collection Time    11/03/12  1:13 PM      Result Value Range Status   MRSA by PCR NEGATIVE  NEGATIVE Final   Comment:            The GeneXpert MRSA Assay (FDA     approved for NASAL specimens     only), is one component of a     comprehensive MRSA colonization     surveillance program. It is not     intended to diagnose MRSA     infection nor to guide or     monitor treatment for     MRSA infections.  CULTURE, BLOOD (ROUTINE X 2)     Status: None   Collection Time    11/04/12  8:59 AM      Result Value Range Status   Specimen Description BLOOD LEFT HAND   Final   Special Requests BOTTLES DRAWN AEROBIC AND ANAEROBIC 6CC   Final   Culture NO GROWTH 1 DAY   Final   Report Status PENDING   Incomplete  CULTURE, BLOOD (ROUTINE X 2)     Status: None   Collection Time    11/04/12  9:09  AM      Result Value Range Status   Specimen Description BLOOD RIGHT HAND   Final   Special Requests BOTTLES DRAWN AEROBIC ONLY 5CC   Final   Culture NO GROWTH 1 DAY   Final   Report Status PENDING   Incomplete  URINE CULTURE     Status: None   Collection Time    11/04/12  2:33 PM      Result Value Range Status   Specimen Description URINE, CLEAN CATCH   Final   Special Requests NONE   Final   Culture  Setup Time 11/04/2012 18:47   Final   Colony Count 80,000 COLONIES/ML   Final   Culture     Final   Value: Multiple bacterial morphotypes present, none predominant. Suggest appropriate recollection if clinically indicated.   Report Status 11/06/2012 FINAL   Final   Medical History: Past Medical History  Diagnosis Date  . Thyroid disease   . Hypertension   . Hyperlipidemia   . COPD (chronic obstructive pulmonary disease)    Medications:  Scheduled:  . antiseptic oral rinse  15 mL Mouth Rinse BID  . aspirin EC  81 mg Oral Daily  .  enoxaparin (LOVENOX) injection  80 mg Subcutaneous Q24H  . famotidine  20 mg Oral BID  . fenofibrate  160 mg Oral Daily  . levothyroxine  200 mcg Oral QAC breakfast  . piperacillin-tazobactam (ZOSYN)  IV  3.375 g Intravenous Q8H  . sodium chloride  3 mL Intravenous Q12H   Assessment: 46yo obese female with suspected UTI.  Zosyn added per MD.  Pt has good renal fxn.  Estimated Creatinine Clearance: 133.2 ml/min (by C-G formula based on Cr of 0.64).  Goal of Therapy:  Eradicate infection.  Plan:  Zosyn 3.375gm IV q8hrs to be infused over 4 hours Dose stable, will sign off.   Lamonte Richer R 11/06/2012,10:22 AM

## 2012-11-06 NOTE — Progress Notes (Signed)
Pt has been noncompliant in wearing bipap all night. States she thinks she is doing fine without it. Pt told that it is best she wears bipap. Pt falls back to sleep. Hourly rounding done throughout the night. No sign of distress.

## 2012-11-06 NOTE — Progress Notes (Signed)
Pt's O2 saturation checked on room air at rest.  O2 saturation 87 % at that time.  Pt's O2 saturation checked on room air ambulating.  Pt's O2 saturation 88% at that time.  Pt's placed on O2 via [place] at 2 LPM. Pt's O2 saturation 93 % at this time.

## 2012-11-06 NOTE — Progress Notes (Signed)
Pt discharged home today with [facility].  [facility] faxed orders for DME BSC, DME Walker, DME Oxygen, DME CPAP and RN. Pt amde aware.  Pt provided with home medication list, discharge instructions, prescriptions and prescription voucher. Verbalized understanding. Pt's IV site D/C'd and WNL. Pt's VS stable at this time. Pt left floor via WC in stable condition accompanied by NT.

## 2012-11-09 LAB — CULTURE, BLOOD (ROUTINE X 2)
Culture: NO GROWTH
Culture: NO GROWTH

## 2012-11-17 ENCOUNTER — Ambulatory Visit (HOSPITAL_COMMUNITY)
Admission: RE | Admit: 2012-11-17 | Discharge: 2012-11-17 | Disposition: A | Discharge status: Home / Self Care | Payer: Self-pay | Source: Ambulatory Visit | Attending: Orthopaedic Surgery | Admitting: Orthopaedic Surgery

## 2012-11-17 ENCOUNTER — Other Ambulatory Visit (HOSPITAL_COMMUNITY): Payer: Self-pay | Admitting: Orthopaedic Surgery

## 2012-11-17 DIAGNOSIS — S82892A Other fracture of left lower leg, initial encounter for closed fracture: Secondary | ICD-10-CM

## 2012-11-17 DIAGNOSIS — Z4789 Encounter for other orthopedic aftercare: Secondary | ICD-10-CM | POA: Insufficient documentation

## 2012-11-26 ENCOUNTER — Ambulatory Visit (HOSPITAL_COMMUNITY)
Admission: RE | Admit: 2012-11-26 | Discharge: 2012-11-26 | Disposition: A | Discharge status: Home / Self Care | Payer: Self-pay | Source: Ambulatory Visit | Attending: Orthopaedic Surgery | Admitting: Orthopaedic Surgery

## 2012-11-26 ENCOUNTER — Other Ambulatory Visit (HOSPITAL_COMMUNITY): Payer: Self-pay | Admitting: Orthopaedic Surgery

## 2012-11-26 ENCOUNTER — Encounter (HOSPITAL_COMMUNITY): Payer: Self-pay | Admitting: Pharmacy Technician

## 2012-11-26 ENCOUNTER — Other Ambulatory Visit: Payer: Self-pay | Admitting: Radiology

## 2012-11-26 DIAGNOSIS — Z4789 Encounter for other orthopedic aftercare: Secondary | ICD-10-CM | POA: Insufficient documentation

## 2012-11-26 DIAGNOSIS — S82892D Other fracture of left lower leg, subsequent encounter for closed fracture with routine healing: Secondary | ICD-10-CM

## 2012-11-29 ENCOUNTER — Other Ambulatory Visit: Payer: Self-pay

## 2012-11-29 ENCOUNTER — Encounter (HOSPITAL_COMMUNITY): Payer: Self-pay

## 2012-11-29 ENCOUNTER — Encounter (HOSPITAL_COMMUNITY)
Admission: RE | Admit: 2012-11-29 | Discharge: 2012-11-29 | Disposition: A | Discharge status: Home / Self Care | Payer: Self-pay | Source: Ambulatory Visit | Attending: Orthopaedic Surgery | Admitting: Orthopaedic Surgery

## 2012-11-29 HISTORY — DX: Major depressive disorder, single episode, unspecified: F32.9

## 2012-11-29 HISTORY — DX: Shortness of breath: R06.02

## 2012-11-29 HISTORY — DX: Unspecified asthma, uncomplicated: J45.909

## 2012-11-29 HISTORY — DX: Sleep apnea, unspecified: G47.30

## 2012-11-29 HISTORY — DX: Depression, unspecified: F32.A

## 2012-11-29 HISTORY — DX: Unspecified osteoarthritis, unspecified site: M19.90

## 2012-11-29 LAB — CBC WITH DIFFERENTIAL/PLATELET
Basophils Absolute: 0 10*3/uL (ref 0.0–0.1)
Basophils Relative: 0 % (ref 0–1)
HCT: 44.9 % (ref 36.0–46.0)
Lymphocytes Relative: 23 % (ref 12–46)
MCHC: 34.7 g/dL (ref 30.0–36.0)
Neutro Abs: 4.7 10*3/uL (ref 1.7–7.7)
Neutrophils Relative %: 67 % (ref 43–77)
Platelets: 238 10*3/uL (ref 150–400)
RDW: 13.9 % (ref 11.5–15.5)
WBC: 7.1 10*3/uL (ref 4.0–10.5)

## 2012-11-29 LAB — COMPREHENSIVE METABOLIC PANEL
ALT: 23 U/L (ref 0–35)
AST: 29 U/L (ref 0–37)
Albumin: 3.7 g/dL (ref 3.5–5.2)
CO2: 31 mEq/L (ref 19–32)
Chloride: 92 mEq/L — ABNORMAL LOW (ref 96–112)
Creatinine, Ser: 0.56 mg/dL (ref 0.50–1.10)
GFR calc non Af Amer: >90 mL/min (ref >90)
Sodium: 135 mEq/L (ref 135–145)
Total Bilirubin: 0.4 mg/dL (ref 0.3–1.2)

## 2012-11-29 LAB — SURGICAL PCR SCREEN
MRSA, PCR: NEGATIVE
Staphylococcus aureus: POSITIVE — AB

## 2012-11-29 NOTE — Patient Instructions (Addendum)
[name]  11/29/2012   Your procedure is scheduled on:   12/01/2012  Report to [facility] at  1030  AM.  Call this number if you have problems the morning of surgery: [phone]   Remember:   Do not eat food or drink liquids after midnight.   Take these medicines the morning of surgery with A SIP OF WATER: norco,synthroid, hyzaar   Do not wear jewelry, make-up or nail polish.  Do not wear lotions, powders, or perfumes.   Do not shave 48 hours prior to surgery. Men may shave face and neck.  Do not bring valuables to the hospital.  [facility] is not responsible  for any belongings or valuables.  Contacts, dentures or bridgework may not be worn into surgery.  Leave suitcase in the car. After surgery it may be brought to your room.  For patients admitted to the hospital, checkout time is 11:00 AM the day of discharge.   Patients discharged the day of surgery will not be allowed to drive  home.  Name and phone number of your driver: family  Special Instructions: Shower using CHG 2 nights before surgery and the night before surgery.  If you shower the day of surgery use CHG.  Use special wash - you have one bottle of CHG for all showers.  You should use approximately 1/3 of the bottle for each shower.   Please read over the following fact sheets that you were given: Pain Booklet, Coughing and Deep Breathing, MRSA Information, Surgical Site Infection Prevention, Anesthesia Post-op Instructions and Care and Recovery After Surgery Ankle Fracture A fracture is a break in the bone. A cast or splint is used to protect and keep your injured bone from moving.  HOME CARE INSTRUCTIONS   Use your crutches as directed.  To lessen the swelling, keep the injured leg elevated while sitting or lying down.  Apply ice to the injury for 15-20 minutes, 3-4 times per day while awake for 2 days. Put the ice in a plastic bag and place a thin towel between the bag of ice and your cast.  If you have  a plaster or fiberglass cast:  Do not try to scratch the skin under the cast using sharp or pointed objects.  Check the skin around the cast every day. You may put lotion on any red or sore areas.  Keep your cast dry and clean.  If you have a plaster splint:  Wear the splint as directed.  You may loosen the elastic around the splint if your toes become numb, tingle, or turn cold or blue.  Do not put pressure on any part of your cast or splint; it may break. Rest your cast only on a pillow the first 24 hours until it is fully hardened.  Your cast or splint can be protected during bathing with a plastic bag. Do not lower the cast or splint into water.  Take medications as directed by your caregiver. Only take over-the-counter or prescription medicines for pain, discomfort, or fever as directed by your caregiver.  Do not drive a vehicle until your caregiver specifically tells you it is safe to do so.  If your caregiver has given you a follow-up appointment, it is very important to keep that appointment. Not keeping the appointment could result in a chronic or permanent injury, pain, and disability. If there is any problem keeping the appointment, you must call back to this facility for assistance. SEEK  IMMEDIATE MEDICAL CARE IF:   Your cast gets damaged or breaks.  You have continued severe pain or more swelling than you did before the cast was put on.  Your skin or toenails below the injury turn blue or gray, or feel cold or numb.  There is a bad smell or new stains and/or purulent (pus like) drainage coming from under the cast. If you do not have a window in your cast for observing the wound, a discharge or minor bleeding may show up as a stain on the outside of your cast. Report these findings to your caregiver. MAKE SURE YOU:   Understand these instructions.  Will watch your condition.  Will get help right away if you are not doing well or get worse. Document Released:  06/13/2000 Document Revised: 09/08/2011 Document Reviewed: 01/18/2008 [facility] Patient Information 2014 [place], [place]. PATIENT INSTRUCTIONS POST-ANESTHESIA  IMMEDIATELY FOLLOWING SURGERY:  Do not drive or operate machinery for the first twenty four hours after surgery.  Do not make any important decisions for twenty four hours after surgery or while taking narcotic pain medications or sedatives.  If you develop intractable nausea and vomiting or a severe headache please notify your doctor immediately.  FOLLOW-UP:  Please make an appointment with your surgeon as instructed. You do not need to follow up with anesthesia unless specifically instructed to do so.  WOUND CARE INSTRUCTIONS (if applicable):  Keep a dry clean dressing on the anesthesia/puncture wound site if there is drainage.  Once the wound has quit draining you may leave it open to air.  Generally you should leave the bandage intact for twenty four hours unless there is drainage.  If the epidural site drains for more than 36-48 hours please call the anesthesia department.  QUESTIONS?:  Please feel free to call your physician or the hospital operator if you have any questions, and they will be happy to assist you.

## 2012-11-30 NOTE — OR Nursing (Signed)
Called patient at home in regards to lab , SA+, States she has no way to get the Mupiricin filled and asked to start  This treatment in AM.

## 2012-11-30 NOTE — H&P (Signed)
[name] is an 47 y.o. female.   Chief Complaint: Left ankle fracture HPI: She fell about two weeks ago and hurt her left ankle.  She sustained a nondisplaced trimalleolar fracture of the ankle.  She was placed in a posterior splint.  She initially refused hospital admission and went home.  She later returned to the hospital that same evening with severe shortness of breath.  She was admitted to the ICU with acute and chronic respiratory failure.  She was in the ICU for several days.  The fracture remained nondisplaced.  She went home with the posterior splint.  She walked on the fracture.  The fracture "slipped".  She missed several appointments to my office.  When seen the fracture had some changes. I recommended surgery.  She declined.  She returned to my office on May 30th.  The fracture had changed even more and I strongly recommended surgery.  She reluctantly agreed.  She is wearing a CAM walker.   She is morbidly obese.  She is very short of breath even at rest.  She in not in good medical condition.  I went over the risks and imponderables with her.  She is at very high risk.  I have set her surgery for Wednesday June 4th.  She will be admitted to the hospital post operative and I will request an ICU bed for her.  She will need to be closely monitored for breathing problems.  Past Medical History  Diagnosis Date  . Thyroid disease   . Hypertension   . Hyperlipidemia   . COPD (chronic obstructive pulmonary disease)   . Asthma   . Shortness of breath   . Sleep apnea   . Arthritis   . Depression     Past Surgical History  Procedure Laterality Date  . Tubal ligation    . Ankle surgery      Family History  Problem Relation Age of Onset  . Cancer Mother   . Heart failure Father    Social History:  reports that she has been smoking Cigarettes.  She has a 30 pack-year smoking history. She uses smokeless tobacco. She reports that she does not drink alcohol or use illicit  drugs.  Allergies: No Known Allergies  No prescriptions prior to admission    Results for orders placed during the hospital encounter of 11/29/12 (from the past 48 hour(s))  CBC WITH DIFFERENTIAL     Status: Abnormal   Collection Time    11/29/12  2:25 PM      Result Value Range   WBC 7.1  4.0 - 10.5 K/uL   RBC 4.84  3.87 - 5.11 MIL/uL   Hemoglobin 15.6 (*) 12.0 - 15.0 g/dL   HCT 45.4  09.8 - 11.9 %   MCV 92.8  78.0 - 100.0 fL   MCH 32.2  26.0 - 34.0 pg   MCHC 34.7  30.0 - 36.0 g/dL   RDW 14.7  82.9 - 56.2 %   Platelets 238  150 - 400 K/uL   Neutrophils Relative % 67  43 - 77 %   Neutro Abs 4.7  1.7 - 7.7 K/uL   Lymphocytes Relative 23  12 - 46 %   Lymphs Abs 1.6  0.7 - 4.0 K/uL   Monocytes Relative 7  3 - 12 %   Monocytes Absolute 0.5  0.1 - 1.0 K/uL   Eosinophils Relative 3  0 - 5 %   Eosinophils Absolute 0.2  0.0 - 0.7  K/uL   Basophils Relative 0  0 - 1 %   Basophils Absolute 0.0  0.0 - 0.1 K/uL  COMPREHENSIVE METABOLIC PANEL     Status: Abnormal   Collection Time    11/29/12  2:25 PM      Result Value Range   Sodium 135  135 - 145 mEq/L   Potassium 4.2  3.5 - 5.1 mEq/L   Chloride 92 (*) 96 - 112 mEq/L   CO2 31  19 - 32 mEq/L   Glucose, Bld 123 (*) 70 - 99 mg/dL   BUN 13  6 - 23 mg/dL   Creatinine, Ser 1.61  0.50 - 1.10 mg/dL   Calcium 9.6  8.4 - 09.6 mg/dL   Total Protein 7.5  6.0 - 8.3 g/dL   Albumin 3.7  3.5 - 5.2 g/dL   AST 29  0 - 37 U/L   ALT 23  0 - 35 U/L   Alkaline Phosphatase 76  39 - 117 U/L   Total Bilirubin 0.4  0.3 - 1.2 mg/dL   GFR calc non Af Amer >90  >90 mL/min   GFR calc Af Amer >90  >90 mL/min   Comment:            The eGFR has been calculated     using the CKD EPI equation.     This calculation has not been     validated in all clinical     situations.     eGFR's persistently     <90 mL/min signify     possible Chronic Kidney Disease.  SURGICAL PCR SCREEN     Status: Abnormal   Collection Time    11/29/12  2:45 PM      Result  Value Range   MRSA, PCR NEGATIVE  NEGATIVE   Staphylococcus aureus POSITIVE (*) NEGATIVE   Comment:            The Xpert SA Assay (FDA     approved for NASAL specimens     in patients over 65 years of age),     is one component of     a comprehensive surveillance     program.  Test performance has     been validated by [employer] for patients greater     than or equal to 34 year old.     It is not intended     to diagnose infection nor to     guide or monitor treatment.     RESULT CALLED TO, READ BACK BY AND VERIFIED WITH:     LEFT MESSAGE FOR KIM FAINT AT 1902 ON 11/29/2012 BY BAUGHAM,M.   No results found.  Review of Systems  Respiratory: Positive for cough and shortness of breath (History of cigarette smoking).        Recent ICU admission with acute and chronic respiratory failure, COPD, Obesity hypoventilation syndrome, hypoxia  Cardiovascular:       History of hypotension.  History of hypertension.  Musculoskeletal: Positive for falls (Fall at home two weeks ago fracture of ankle on the left.).  Endo/Heme/Allergies:       Morbid obesity   All other systems reviewed and are negative.    Last menstrual period 10/28/2012. Physical Exam  Constitutional: She is oriented to person, place, and time. She appears well-developed and well-nourished.  Morbid obesity  HENT:  Head: Normocephalic and atraumatic.  Eyes: Conjunctivae and EOM are normal.  Neck: Normal range of motion.  Neck supple.  Cardiovascular: Normal rate, regular rhythm, normal heart sounds and intact distal pulses.   Respiratory: Effort normal. She has wheezes.  GI: Soft. Bowel sounds are normal.  Musculoskeletal: She exhibits edema and tenderness (Pain left ankle with deformity. Has CAM walker).       Left ankle: She exhibits decreased range of motion, swelling, ecchymosis and deformity. Tenderness. Lateral malleolus and medial malleolus tenderness found.       Feet:  Neurological: She is alert and  oriented to person, place, and time. She has normal reflexes.  Skin: Skin is warm and dry.  Psychiatric: She has a normal mood and affect. Her behavior is normal. Judgment and thought content normal.     Assessment/Plan Post trimalleolar fracture of the left ankle with displacement.  Severe chronic lung problems.  Marked morbid obesity.  She will be admitted post surgery.  I will request an ICU bed.    Briauna Gilmartin 11/30/2012, 6:40 AM

## 2012-11-30 NOTE — OR Nursing (Signed)
Dr. Jayme Cloud returned call. Orders given to assess patient on arrival in am.

## 2012-11-30 NOTE — OR Nursing (Signed)
Informed Glynn Octave about this case in the morning which is 0730 start.

## 2012-11-30 NOTE — OR Nursing (Signed)
Attempted to call Dr. Jayme Cloud in regards to this pt. medical history. No answer at home. Left message on his cell phone to return my call.

## 2012-12-01 ENCOUNTER — Encounter (HOSPITAL_COMMUNITY): Payer: Self-pay | Admitting: Anesthesiology

## 2012-12-01 ENCOUNTER — Encounter (HOSPITAL_COMMUNITY)
Admission: RE | Disposition: A | Discharge status: Skilled Nursing Facility | Payer: Self-pay | Source: Ambulatory Visit | Attending: Orthopaedic Surgery

## 2012-12-01 ENCOUNTER — Ambulatory Visit (HOSPITAL_COMMUNITY): Payer: Self-pay

## 2012-12-01 ENCOUNTER — Encounter (HOSPITAL_COMMUNITY): Payer: Self-pay | Admitting: *Deleted

## 2012-12-01 ENCOUNTER — Inpatient Hospital Stay (HOSPITAL_COMMUNITY)
Admission: RE | Admit: 2012-12-01 | Discharge: 2012-12-03 | DRG: 492 | Disposition: A | Discharge status: Skilled Nursing Facility | Payer: MEDICAID | Source: Ambulatory Visit | Attending: Orthopaedic Surgery | Admitting: Orthopaedic Surgery

## 2012-12-01 ENCOUNTER — Ambulatory Visit (HOSPITAL_COMMUNITY): Payer: Self-pay | Admitting: Anesthesiology

## 2012-12-01 DIAGNOSIS — E871 Hypo-osmolality and hyponatremia: Secondary | ICD-10-CM | POA: Diagnosis not present

## 2012-12-01 DIAGNOSIS — Z6841 Body Mass Index (BMI) 40.0 and over, adult: Secondary | ICD-10-CM

## 2012-12-01 DIAGNOSIS — F3289 Other specified depressive episodes: Secondary | ICD-10-CM | POA: Diagnosis present

## 2012-12-01 DIAGNOSIS — S82853A Displaced trimalleolar fracture of unspecified lower leg, initial encounter for closed fracture: Principal | ICD-10-CM | POA: Diagnosis present

## 2012-12-01 DIAGNOSIS — Y92009 Unspecified place in unspecified non-institutional (private) residence as the place of occurrence of the external cause: Secondary | ICD-10-CM

## 2012-12-01 DIAGNOSIS — I959 Hypotension, unspecified: Secondary | ICD-10-CM

## 2012-12-01 DIAGNOSIS — I10 Essential (primary) hypertension: Secondary | ICD-10-CM | POA: Diagnosis present

## 2012-12-01 DIAGNOSIS — W19XXXA Unspecified fall, initial encounter: Secondary | ICD-10-CM | POA: Diagnosis present

## 2012-12-01 DIAGNOSIS — S81801A Unspecified open wound, right lower leg, initial encounter: Secondary | ICD-10-CM | POA: Diagnosis present

## 2012-12-01 DIAGNOSIS — B372 Candidiasis of skin and nail: Secondary | ICD-10-CM | POA: Diagnosis not present

## 2012-12-01 DIAGNOSIS — J4489 Other specified chronic obstructive pulmonary disease: Secondary | ICD-10-CM | POA: Diagnosis present

## 2012-12-01 DIAGNOSIS — S82851A Displaced trimalleolar fracture of right lower leg, initial encounter for closed fracture: Secondary | ICD-10-CM

## 2012-12-01 DIAGNOSIS — F17200 Nicotine dependence, unspecified, uncomplicated: Secondary | ICD-10-CM | POA: Diagnosis present

## 2012-12-01 DIAGNOSIS — E662 Morbid (severe) obesity with alveolar hypoventilation: Secondary | ICD-10-CM | POA: Diagnosis present

## 2012-12-01 DIAGNOSIS — J9610 Chronic respiratory failure, unspecified whether with hypoxia or hypercapnia: Secondary | ICD-10-CM | POA: Diagnosis present

## 2012-12-01 DIAGNOSIS — J9620 Acute and chronic respiratory failure, unspecified whether with hypoxia or hypercapnia: Secondary | ICD-10-CM | POA: Diagnosis present

## 2012-12-01 DIAGNOSIS — Z9981 Dependence on supplemental oxygen: Secondary | ICD-10-CM

## 2012-12-01 DIAGNOSIS — Z23 Encounter for immunization: Secondary | ICD-10-CM

## 2012-12-01 DIAGNOSIS — F329 Major depressive disorder, single episode, unspecified: Secondary | ICD-10-CM | POA: Diagnosis present

## 2012-12-01 DIAGNOSIS — J449 Chronic obstructive pulmonary disease, unspecified: Secondary | ICD-10-CM | POA: Diagnosis present

## 2012-12-01 DIAGNOSIS — E039 Hypothyroidism, unspecified: Secondary | ICD-10-CM | POA: Diagnosis present

## 2012-12-01 DIAGNOSIS — M129 Arthropathy, unspecified: Secondary | ICD-10-CM | POA: Diagnosis present

## 2012-12-01 DIAGNOSIS — R0902 Hypoxemia: Secondary | ICD-10-CM

## 2012-12-01 DIAGNOSIS — G4733 Obstructive sleep apnea (adult) (pediatric): Secondary | ICD-10-CM | POA: Diagnosis present

## 2012-12-01 DIAGNOSIS — E785 Hyperlipidemia, unspecified: Secondary | ICD-10-CM | POA: Diagnosis present

## 2012-12-01 HISTORY — DX: Chronic respiratory failure, unspecified whether with hypoxia or hypercapnia: J96.10

## 2012-12-01 HISTORY — PX: ORIF ANKLE FRACTURE: SHX5408

## 2012-12-01 SURGERY — OPEN REDUCTION INTERNAL FIXATION (ORIF) ANKLE FRACTURE
Anesthesia: Spinal | Site: Ankle | Laterality: Left | Wound class: Clean

## 2012-12-01 MED ORDER — ONDANSETRON HCL 4 MG/2ML IJ SOLN
4.0000 mg | Freq: Once | INTRAMUSCULAR | Status: AC
  Administered 2012-12-01: 4 mg via INTRAVENOUS

## 2012-12-01 MED ORDER — ALBUTEROL SULFATE (5 MG/ML) 0.5% IN NEBU
2.5000 mg | INHALATION_SOLUTION | Freq: Four times a day (QID) | RESPIRATORY_TRACT | Status: DC
  Administered 2012-12-01 – 2012-12-03 (×8): 2.5 mg via RESPIRATORY_TRACT
  Filled 2012-12-01 (×7): qty 0.5

## 2012-12-01 MED ORDER — CHLORHEXIDINE GLUCONATE CLOTH 2 % EX PADS
6.0000 | MEDICATED_PAD | Freq: Every day | CUTANEOUS | Status: DC
  Administered 2012-12-02 – 2012-12-03 (×2): 6 via TOPICAL

## 2012-12-01 MED ORDER — FENOFIBRATE 54 MG PO TABS
54.0000 mg | ORAL_TABLET | Freq: Every day | ORAL | Status: DC
  Administered 2012-12-02 – 2012-12-03 (×2): 54 mg via ORAL
  Filled 2012-12-01 (×6): qty 1

## 2012-12-01 MED ORDER — ALBUTEROL SULFATE (5 MG/ML) 0.5% IN NEBU: 2.5000 mg | INHALATION_SOLUTION | RESPIRATORY_TRACT | Status: DC | PRN

## 2012-12-01 MED ORDER — DIPHENHYDRAMINE HCL 12.5 MG/5ML PO ELIX: 12.5000 mg | ORAL_SOLUTION | Freq: Four times a day (QID) | ORAL | Status: DC | PRN

## 2012-12-01 MED ORDER — CEFAZOLIN SODIUM 1-5 GM-% IV SOLN
INTRAVENOUS | Status: AC
  Filled 2012-12-01: qty 50

## 2012-12-01 MED ORDER — DEXTROSE 5 % IV SOLN
3.0000 g | Freq: Once | INTRAVENOUS | Status: AC
  Administered 2012-12-01: 3 g via INTRAVENOUS
  Filled 2012-12-01: qty 3000

## 2012-12-01 MED ORDER — NALOXONE HCL 0.4 MG/ML IJ SOLN: 0.4000 mg | INTRAMUSCULAR | Status: DC | PRN

## 2012-12-01 MED ORDER — DIPHENHYDRAMINE HCL 50 MG/ML IJ SOLN
INTRAMUSCULAR | Status: AC
  Filled 2012-12-01: qty 1

## 2012-12-01 MED ORDER — CHLORHEXIDINE GLUCONATE 4 % EX LIQD: 60.0000 mL | Freq: Once | CUTANEOUS | Status: DC

## 2012-12-01 MED ORDER — SODIUM CHLORIDE 0.9 % IR SOLN
Status: DC | PRN
  Administered 2012-12-01: 1000 mL

## 2012-12-01 MED ORDER — CEFAZOLIN SODIUM-DEXTROSE 2-3 GM-% IV SOLR: 2.0000 g | INTRAVENOUS | Status: DC

## 2012-12-01 MED ORDER — ONDANSETRON HCL 4 MG/2ML IJ SOLN: 4.0000 mg | Freq: Once | INTRAMUSCULAR | Status: DC | PRN

## 2012-12-01 MED ORDER — MUPIROCIN 2 % EX OINT
TOPICAL_OINTMENT | CUTANEOUS | Status: AC
  Filled 2012-12-01: qty 22

## 2012-12-01 MED ORDER — GLYCOPYRROLATE 0.2 MG/ML IJ SOLN
INTRAMUSCULAR | Status: AC
  Filled 2012-12-01: qty 1

## 2012-12-01 MED ORDER — CEFAZOLIN SODIUM-DEXTROSE 2-3 GM-% IV SOLR
INTRAVENOUS | Status: AC
  Filled 2012-12-01: qty 50

## 2012-12-01 MED ORDER — PROMETHAZINE HCL 25 MG/ML IJ SOLN: 12.5000 mg | INTRAMUSCULAR | Status: DC | PRN

## 2012-12-01 MED ORDER — MIDAZOLAM HCL 2 MG/2ML IJ SOLN
INTRAMUSCULAR | Status: AC
  Filled 2012-12-01: qty 2

## 2012-12-01 MED ORDER — LOSARTAN POTASSIUM 50 MG PO TABS
100.0000 mg | ORAL_TABLET | Freq: Every day | ORAL | Status: DC
  Administered 2012-12-01 – 2012-12-03 (×3): 100 mg via ORAL
  Filled 2012-12-01 (×3): qty 2

## 2012-12-01 MED ORDER — LEVOTHYROXINE SODIUM 100 MCG PO TABS
200.0000 ug | ORAL_TABLET | Freq: Every day | ORAL | Status: DC
  Administered 2012-12-01 – 2012-12-03 (×3): 200 ug via ORAL
  Filled 2012-12-01 (×3): qty 2

## 2012-12-01 MED ORDER — ASPIRIN EC 81 MG PO TBEC
81.0000 mg | DELAYED_RELEASE_TABLET | Freq: Every day | ORAL | Status: DC
  Administered 2012-12-01 – 2012-12-03 (×3): 81 mg via ORAL
  Filled 2012-12-01 (×3): qty 1

## 2012-12-01 MED ORDER — ZOLPIDEM TARTRATE 5 MG PO TABS
5.0000 mg | ORAL_TABLET | Freq: Every day | ORAL | Status: DC
  Administered 2012-12-01 – 2012-12-02 (×2): 5 mg via ORAL
  Filled 2012-12-01 (×2): qty 1

## 2012-12-01 MED ORDER — LACTATED RINGERS IV SOLN
INTRAVENOUS | Status: DC
  Administered 2012-12-01: 1000 mL via INTRAVENOUS

## 2012-12-01 MED ORDER — FENTANYL CITRATE 0.05 MG/ML IJ SOLN: 25.0000 ug | INTRAMUSCULAR | Status: DC | PRN

## 2012-12-01 MED ORDER — CHLORHEXIDINE GLUCONATE 0.12 % MT SOLN
15.0000 mL | Freq: Two times a day (BID) | OROMUCOSAL | Status: DC
  Administered 2012-12-01 – 2012-12-02 (×2): 15 mL via OROMUCOSAL
  Filled 2012-12-01: qty 15

## 2012-12-01 MED ORDER — DEXTROSE 5 % IV SOLN
3.0000 g | INTRAVENOUS | Status: DC
  Filled 2012-12-01: qty 3000

## 2012-12-01 MED ORDER — EPHEDRINE SULFATE 50 MG/ML IJ SOLN
INTRAMUSCULAR | Status: AC
  Filled 2012-12-01: qty 1

## 2012-12-01 MED ORDER — ENOXAPARIN SODIUM 40 MG/0.4ML ~~LOC~~ SOLN
40.0000 mg | SUBCUTANEOUS | Status: DC
  Administered 2012-12-02 – 2012-12-03 (×2): 40 mg via SUBCUTANEOUS
  Filled 2012-12-01 (×2): qty 0.4

## 2012-12-01 MED ORDER — PROPOFOL 10 MG/ML IV EMUL
INTRAVENOUS | Status: AC
  Filled 2012-12-01: qty 20

## 2012-12-01 MED ORDER — MUPIROCIN 2 % EX OINT
TOPICAL_OINTMENT | Freq: Two times a day (BID) | CUTANEOUS | Status: DC
  Administered 2012-12-01: 1 via NASAL

## 2012-12-01 MED ORDER — MIDAZOLAM HCL 5 MG/5ML IJ SOLN
INTRAMUSCULAR | Status: DC | PRN
  Administered 2012-12-01 (×2): 1 mg via INTRAVENOUS

## 2012-12-01 MED ORDER — GLYCOPYRROLATE 0.2 MG/ML IJ SOLN
0.2000 mg | Freq: Once | INTRAMUSCULAR | Status: AC
  Administered 2012-12-01: 0.2 mg via INTRAVENOUS

## 2012-12-01 MED ORDER — HYDROMORPHONE 0.3 MG/ML IV SOLN
INTRAVENOUS | Status: DC
  Administered 2012-12-01: 12:00:00 via INTRAVENOUS
  Administered 2012-12-01: 3.9 mg via INTRAVENOUS
  Administered 2012-12-02: 0.3 mg via INTRAVENOUS
  Administered 2012-12-02: 0.9 mg via INTRAVENOUS
  Administered 2012-12-02: 15 mg via INTRAVENOUS
  Administered 2012-12-02: 0.6 mg via INTRAVENOUS
  Administered 2012-12-02: 10:00:00 via INTRAVENOUS
  Administered 2012-12-03: 1.5 mg via INTRAVENOUS
  Administered 2012-12-03: 0.6 mg via INTRAVENOUS
  Filled 2012-12-01 (×2): qty 25

## 2012-12-01 MED ORDER — DIPHENHYDRAMINE HCL 50 MG/ML IJ SOLN: 12.5000 mg | Freq: Four times a day (QID) | INTRAMUSCULAR | Status: DC | PRN

## 2012-12-01 MED ORDER — CEFAZOLIN SODIUM 1-5 GM-% IV SOLN: 1.0000 g | Freq: Once | INTRAVENOUS | Status: DC

## 2012-12-01 MED ORDER — MUPIROCIN 2 % EX OINT
TOPICAL_OINTMENT | Freq: Every day | CUTANEOUS | Status: DC
  Administered 2012-12-02 – 2012-12-03 (×2): via NASAL

## 2012-12-01 MED ORDER — LOSARTAN POTASSIUM-HCTZ 100-25 MG PO TABS: 1.0000 | ORAL_TABLET | Freq: Every day | ORAL | Status: DC

## 2012-12-01 MED ORDER — ONDANSETRON HCL 4 MG/2ML IJ SOLN: 4.0000 mg | Freq: Four times a day (QID) | INTRAMUSCULAR | Status: DC | PRN

## 2012-12-01 MED ORDER — CEFAZOLIN SODIUM-DEXTROSE 2-3 GM-% IV SOLR: 2.0000 g | Freq: Once | INTRAVENOUS | Status: DC

## 2012-12-01 MED ORDER — BUPIVACAINE IN DEXTROSE 0.75-8.25 % IT SOLN
INTRATHECAL | Status: AC
  Filled 2012-12-01: qty 2

## 2012-12-01 MED ORDER — FENTANYL CITRATE 0.05 MG/ML IJ SOLN
INTRAMUSCULAR | Status: DC | PRN
  Administered 2012-12-01: 20 ug via INTRAVENOUS
  Administered 2012-12-01 (×2): 25 ug via INTRAVENOUS

## 2012-12-01 MED ORDER — MIDAZOLAM HCL 2 MG/2ML IJ SOLN
1.0000 mg | INTRAMUSCULAR | Status: DC | PRN
  Administered 2012-12-01: 2 mg via INTRAVENOUS

## 2012-12-01 MED ORDER — BIOTENE DRY MOUTH MT LIQD
15.0000 mL | Freq: Two times a day (BID) | OROMUCOSAL | Status: DC
  Administered 2012-12-02 – 2012-12-03 (×3): 15 mL via OROMUCOSAL

## 2012-12-01 MED ORDER — PROPOFOL INFUSION 10 MG/ML OPTIME
INTRAVENOUS | Status: DC | PRN
  Administered 2012-12-01: 25 ug/kg/min via INTRAVENOUS

## 2012-12-01 MED ORDER — ALBUTEROL SULFATE (5 MG/ML) 0.5% IN NEBU
2.5000 mg | INHALATION_SOLUTION | Freq: Four times a day (QID) | RESPIRATORY_TRACT | Status: DC
  Administered 2012-12-01: 2.5 mg via RESPIRATORY_TRACT
  Filled 2012-12-01: qty 0.5

## 2012-12-01 MED ORDER — ONDANSETRON HCL 4 MG/2ML IJ SOLN
INTRAMUSCULAR | Status: AC
  Filled 2012-12-01: qty 2

## 2012-12-01 MED ORDER — HYDROCHLOROTHIAZIDE 25 MG PO TABS
25.0000 mg | ORAL_TABLET | Freq: Every day | ORAL | Status: DC
  Administered 2012-12-01 – 2012-12-03 (×3): 25 mg via ORAL
  Filled 2012-12-01 (×3): qty 1

## 2012-12-01 MED ORDER — DIPHENHYDRAMINE HCL 50 MG/ML IJ SOLN
25.0000 mg | Freq: Once | INTRAMUSCULAR | Status: AC
  Administered 2012-12-01: 25 mg via INTRAVENOUS

## 2012-12-01 MED ORDER — LIDOCAINE HCL (PF) 1 % IJ SOLN
INTRAMUSCULAR | Status: AC
  Filled 2012-12-01: qty 5

## 2012-12-01 MED ORDER — FENTANYL CITRATE 0.05 MG/ML IJ SOLN
INTRAMUSCULAR | Status: AC
  Filled 2012-12-01: qty 5

## 2012-12-01 MED ORDER — SODIUM CHLORIDE 0.9 % IJ SOLN: 9.0000 mL | INTRAMUSCULAR | Status: DC | PRN

## 2012-12-01 MED ORDER — SODIUM CHLORIDE 0.9 % IJ SOLN
3.0000 mL | Freq: Two times a day (BID) | INTRAMUSCULAR | Status: DC
  Administered 2012-12-02: 3 mL via INTRAVENOUS

## 2012-12-01 SURGICAL SUPPLY — 65 items
BAG HAMPER (MISCELLANEOUS) ×2 IMPLANT
BANDAGE ELASTIC 4 VELCRO NS (GAUZE/BANDAGES/DRESSINGS) ×2 IMPLANT
BANDAGE ELASTIC 6 VELCRO NS (GAUZE/BANDAGES/DRESSINGS) IMPLANT
BANDAGE ESMARK 4X12 BL STRL LF (DISPOSABLE) ×1 IMPLANT
BIT DRILL 2.5X110 QC LCP DISP (BIT) ×2 IMPLANT
BIT DRILL 2.8 (BIT) ×1
BIT DRILL CANN QC 2.8X165 (BIT) IMPLANT
BIT DRILL JC END 3.2X130 (BIT) ×1 IMPLANT
BIT DRILL Q/COUPLING 1 (BIT) ×1 IMPLANT
BLADE SURG 15 STRL LF DISP TIS (BLADE) ×1 IMPLANT
BLADE SURG 15 STRL SS (BLADE) ×2
BNDG CMPR 12X4 ELC STRL LF (DISPOSABLE) ×1
BNDG ESMARK 4X12 BLUE STRL LF (DISPOSABLE) ×2
CLOTH BEACON ORANGE TIMEOUT ST (SAFETY) ×2 IMPLANT
COVER LIGHT HANDLE STERIS (MISCELLANEOUS) ×4 IMPLANT
COVER MAYO STAND XLG (DRAPE) ×2 IMPLANT
CUFF TOURNIQUET SINGLE 44IN (TOURNIQUET CUFF) ×1 IMPLANT
DRAPE C-ARM FOLDED MOBILE STRL (DRAPES) ×1 IMPLANT
DRILL BIT 2.8MM (BIT) ×2
DURAPREP 26ML APPLICATOR (WOUND CARE) ×2 IMPLANT
GAUZE XEROFORM 5X9 LF (GAUZE/BANDAGES/DRESSINGS) ×2 IMPLANT
GLOVE BIO SURGEON STRL SZ8 (GLOVE) ×2 IMPLANT
GLOVE BIO SURGEON STRL SZ8.5 (GLOVE) ×2 IMPLANT
GLOVE BIOGEL PI IND STRL 7.0 (GLOVE) IMPLANT
GLOVE BIOGEL PI INDICATOR 7.0 (GLOVE) ×4
GLOVE ECLIPSE 6.5 STRL STRAW (GLOVE) ×1 IMPLANT
GLOVE EXAM NITRILE MD LF STRL (GLOVE) ×1 IMPLANT
GLOVE SS BIOGEL STRL SZ 6.5 (GLOVE) IMPLANT
GLOVE SUPERSENSE BIOGEL SZ 6.5 (GLOVE) ×1
GOWN STRL REIN XL XLG (GOWN DISPOSABLE) ×6 IMPLANT
INST SET MINOR BONE (KITS) ×2 IMPLANT
K-WIRE 229MX1.6 (WIRE) IMPLANT
K-WIRE 4.0X.028 (WIRE) ×2 IMPLANT
K-WIRE SNGL END TROCAR 9X.062 (WIRE) ×2
K-wire 0.062" x 9" ×1 IMPLANT
KIT ROOM TURNOVER APOR (KITS) ×2 IMPLANT
KWIRE SNGL END TROCAR 9X.062 (WIRE) IMPLANT
MANIFOLD NEPTUNE II (INSTRUMENTS) ×2 IMPLANT
NS IRRIG 1000ML POUR BTL (IV SOLUTION) ×2 IMPLANT
PACK BASIC LIMB (CUSTOM PROCEDURE TRAY) ×2 IMPLANT
PAD ABD 5X9 TENDERSORB (GAUZE/BANDAGES/DRESSINGS) ×3 IMPLANT
PAD ARMBOARD 7.5X6 YLW CONV (MISCELLANEOUS) ×2 IMPLANT
PAD CAST 4YDX4 CTTN HI CHSV (CAST SUPPLIES) ×1 IMPLANT
PADDING CAST COTTON 4X4 STRL (CAST SUPPLIES) ×2
PLATE LCP 3.5 1/3 TUB 5HX57 (Plate) ×1 IMPLANT
PLATE LCP 3.5 1/3 TUB 6HX69 (Plate) ×1 IMPLANT
SCREW CANC FT/18 4.0 (Screw) ×1 IMPLANT
SCREW CORTEX 3.5 16MM (Screw) ×1 IMPLANT
SCREW CORTEX 3.5 18MM (Screw) ×1 IMPLANT
SCREW CORTEX 3.5 22MM (Screw) ×1 IMPLANT
SCREW LOCK CORT ST 3.5X16 (Screw) IMPLANT
SCREW LOCK CORT ST 3.5X18 (Screw) IMPLANT
SCREW LOCK CORT ST 3.5X22 (Screw) IMPLANT
SCREW LOCK T15 FT 16X3.5X2.9X (Screw) IMPLANT
SCREW LOCKING 3.5X16 (Screw) ×2 IMPLANT
SCREW PROS MALLEO 55 26MM (Screw) ×1 IMPLANT
SET BASIN LINEN APH (SET/KITS/TRAYS/PACK) ×2 IMPLANT
SPONGE GAUZE 4X4 12PLY (GAUZE/BANDAGES/DRESSINGS) ×2 IMPLANT
SPONGE LAP 18X18 X RAY DECT (DISPOSABLE) ×2 IMPLANT
SPONGE LAP 4X18 X RAY DECT (DISPOSABLE) ×2 IMPLANT
STAPLER VISISTAT 35W (STAPLE) ×1 IMPLANT
SUT CHROMIC 2 0 CT 1 (SUTURE) ×2 IMPLANT
SUT NUROLON CT 2 BLK #1 18IN (SUTURE) IMPLANT
SUT PLAIN 2 0 XLH (SUTURE) ×2 IMPLANT
TOWEL OR 17X26 4PK STRL BLUE (TOWEL DISPOSABLE) ×2 IMPLANT

## 2012-12-01 NOTE — Anesthesia Postprocedure Evaluation (Addendum)
  Anesthesia Post-op Note  Patient: [name]  Procedure(s) Performed: Procedure(s): OPEN REDUCTION INTERNAL FIXATION (ORIF) ANKLE FRACTURE (Left)  Patient Location: PACU  Anesthesia Type:Spinal  Level of Consciousness: awake, alert  and oriented  Airway and Oxygen Therapy: Patient Spontanous Breathing and Patient connected to face mask oxygen  Post-op Pain: none  Post-op Assessment: Post-op Vital signs reviewed, Patient's Cardiovascular Status Stable, Respiratory Function Stable, Patent Airway and No signs of Nausea or vomiting  Post-op Vital Signs: Reviewed and stable  Complications: No apparent anesthesia complications 12/02/12  Remains in ICU secondary to hypoxia and need for CPAP.  VSS.  No apparent anesthesia complications.

## 2012-12-01 NOTE — Anesthesia Procedure Notes (Signed)
Spinal  Patient location during procedure: OR Start time: 12/01/2012 7:57 AM Staffing CRNA/Resident: Glynn Octave E Preanesthetic Checklist Completed: patient identified, site marked, surgical consent, pre-op evaluation, timeout performed, IV checked, risks and benefits discussed and monitors and equipment checked Spinal Block Patient position: sitting Prep: Betadine Patient monitoring: heart rate, cardiac monitor, continuous pulse ox and blood pressure Approach: midline Location: L3-4 Injection technique: single-shot Needle Needle type: Spinocan  Needle gauge: 22 G Needle length: 9 cm Assessment Sensory level: T10 Additional Notes Attempt:1 Tray #[id number] Exp. 05/2013

## 2012-12-01 NOTE — Progress Notes (Signed)
Change pt over to CPAP machine per DR order pt on CPAP of 12 wit 5lpm bleed in

## 2012-12-01 NOTE — Brief Op Note (Signed)
12/01/2012  9:04 AM  PATIENT:  [name]  47 y.o. female  PRE-OPERATIVE DIAGNOSIS:  trimalleolar fracture left ankle  POST-OPERATIVE DIAGNOSIS:  trimalleolar fracture left ankle  PROCEDURE:  Procedure(s): OPEN REDUCTION INTERNAL FIXATION (ORIF) ANKLE FRACTURE (Left)  SURGEON:  Surgeon(s) and Role:    * Darreld Mclean, MD - Primary  PHYSICIAN ASSISTANT:   ASSISTANTS: [place], RN   ANESTHESIA:   spinal  EBL:  Total I/O In: 700 [I.V.:700] Out: 0   BLOOD ADMINISTERED:none  DRAINS: none   LOCAL MEDICATIONS USED:  NONE  SPECIMEN:  No Specimen  DISPOSITION OF SPECIMEN:  N/A  COUNTS:  YES  TOURNIQUET:   Total Tourniquet Time Documented: Thigh (Left) - 40 minutes Total: Thigh (Left) - 40 minutes   DICTATION: .Other Dictation: Dictation Number [id number]  PLAN OF CARE: Admit to inpatient   PATIENT DISPOSITION:  PACU - hemodynamically stable.   Delay start of Pharmacological VTE agent (>24hrs) due to surgical blood loss or risk of bleeding: no

## 2012-12-01 NOTE — Progress Notes (Signed)
Pt had CPAP/BIPAP machine off when I walked in room to do breathing treatment.She expressed her concerns about trying to nasal mask I told her that she really need to wear her machine and that if I could find a nasal mask we could try it but we will probably have to still wear the full face masked because she breathes through her mouth and she was in agreement with me. Pt is on nasal mask now but will probably go back on full face in a hour or two. Pt suppose to be on CPAP but actually on BIPAP 12/6. Nurse informed and MD called to clarify order.

## 2012-12-01 NOTE — Consult Note (Signed)
[facility] Medical Consultation  ROLA LENNON UJW:[id number] DOB: 15-Sep-1965 DOA: 12/01/2012 PCP: Catalina Pizza, MD   Requesting physician: Hilda Lias Date of consultation: 12/01/12 Reason for consultation: management of medical issues  Impression/Recommendations Principal Problem:   Trimalleolar fracture of ankle, closed: s/p ORIF 12/01/12. Per orthopedics  Active Problems:  Chronic respiratory failure: Related to COPD and  Obesity hypoventilation syndrome. Patient does wear oxygen at home. Currently in the ICU postoperatively patient is maintaining saturation levels greater than 90 on 5 L nasal cannula. Will monitor closely in the ICU a    Obstructive sleep apnea: Patient has been scheduled for outpatient sleep study on 2 separate occasions since her last hospitalization in May. She was unable to keep either of those appointments. Has a history of CPAP but lost over 100 pounds and no longer needed it. Over the last year she has gained that weight back and needs to be evaluated for sleep apnea. Will request respiratory therapy consult for CPAP machine    Obesity hypoventilation syndrome: See #1 and #2 .  COPD presumed. No documentation of pulmonary function tests. Since [facility] for acute on chronic respiratory failure. Patient is on oxygen at home. Provide nebulizers on an as-needed basis. Given #1 and #2 Will monitor closely    Morbid obesity: BMI 54.2. We'll request a nutritional consult    Essential hypertension, benign: Currently somewhat soft. Agree with holding low losartain and HCTZ. Monitor closely.    Hypothyroidism; TSH in May of this year was 24.6. Synthroid increased at that time to 200 MCG is to repeat TSH. Continue Synthroid at current dose.    Hyperlipidemia we'll check lipid panel. Continue fenofibrate.  Depression stable at baseline.    I will followup again tomorrow. Please contact me if I can be of assistance in the meanwhile. Thank you  for this consultation.    HPI: Patient is a very pleasant 47 year old female with a past medical history that includes chronic respiratory failure, morbid obesity, severe obstructive sleep apnea, hypertension, COPD who fell approximately 2 weeks ago and injured her left ankle. At that time she had a nondisplaced fracture. Surgery was postponed at that time due to complications of acute respiratory failure. Today patient underwent OR I F. of left ankle. [facility] asked to consult for medical management.   Review of Systems:  Patient denies any worsening shortness of breath, chest pain, palpitations, abdominal pain, nausea vomiting diarrhea, and denies any recent chills fever or sick contacts. Patient denies dysuria hematuria frequency or urgency. She does report sleeping in an upright position. She has been ambulating with walker since her fall of over weeks ago.  Past Medical History  Diagnosis Date  . Thyroid disease   . Hypertension   . Hyperlipidemia   . COPD (chronic obstructive pulmonary disease)   . Asthma   . Shortness of breath   . Sleep apnea   . Arthritis   . Depression   . Chronic respiratory failure    Past Surgical History  Procedure Laterality Date  . Tubal ligation    . Ankle surgery     Social History:  reports that she has been smoking Cigarettes.  She has a 30 pack-year smoking history. She uses smokeless tobacco. She reports that she does not drink alcohol or use illicit drugs.  No Known Allergies Family History  Problem Relation Age of Onset  . Cancer Mother   . Heart failure Father     Prior to  Admission medications   Medication Sig Start Date End Date Taking? Authorizing Provider  aspirin EC 81 MG tablet Take 81 mg by mouth daily.   Yes Historical Provider, MD  fenofibrate micronized (ANTARA) 130 MG capsule Take 130 mg by mouth daily.   Yes Historical Provider, MD  HYDROcodone-acetaminophen (NORCO) 7.5-325 MG per tablet Take 1 tablet by mouth  every 4 (four) hours as needed for pain.   Yes Historical Provider, MD  levothyroxine (SYNTHROID, LEVOTHROID) 200 MCG tablet Take 200 mcg by mouth daily.   Yes Historical Provider, MD  losartan-hydrochlorothiazide (HYZAAR) 100-25 MG per tablet Take 1 tablet by mouth daily.   Yes Historical Provider, MD   Physical Exam: Blood pressure 110/51, pulse 73, temperature 98 F (36.7 C), temperature source Oral, resp. rate 16, height 5\' 5"  (1.651 m), weight 147.419 kg (325 lb), last menstrual period 10/28/2012, SpO2 92.00%. Filed Vitals:   12/01/12 1200 12/01/12 1300 12/01/12 1400 12/01/12 1450  BP: 101/73 92/60 110/51   Pulse:      Temp:      TempSrc:      Resp: 17 12 16    Height:      Weight:      SpO2:    92%     General:  Morbidly obese. No acute distress  Eyes: PERRL, no scleral icterus  ENT: Ears clear nose without drainage oropharynx pink slightly dry no exudate  Neck:  Supple, no JVD full range of motion no lymphadenopathy  Cardiovascular: Regular rate and rhythm heart sounds distant no murmur gallop or rub trace lower extremity edema  Respiratory: Mild increased work of breathing at rest. Breath sounds distant but clear. No wheeze no rhonchi  Abdomen: Obese soft positive bowel sounds nontender to  Skin: Warm and dry. No rash no lesion  Musculoskeletal: Left ankle with splint and Ace wrap. Toes warm to touch sensation intact  Psychiatric: Calm  Neurologic: Cranial nerves II through XII grossly intact speech clear facial some  Labs on Admission:  Basic Metabolic Panel:  Recent Labs Lab 11/29/12 1425  NA 135  K 4.2  CL 92*  CO2 31  GLUCOSE 123*  BUN 13  CREATININE 0.56  CALCIUM 9.6   Liver Function Tests:  Recent Labs Lab 11/29/12 1425  AST 29  ALT 23  ALKPHOS 76  BILITOT 0.4  PROT 7.5  ALBUMIN 3.7   No results found for this basename: LIPASE, AMYLASE,  in the last 168 hours No results found for this basename: AMMONIA,  in the last 168  hours CBC:  Recent Labs Lab 11/29/12 1425  WBC 7.1  NEUTROABS 4.7  HGB 15.6*  HCT 44.9  MCV 92.8  PLT 238   Cardiac Enzymes: No results found for this basename: CKTOTAL, CKMB, CKMBINDEX, TROPONINI,  in the last 168 hours BNP: No components found with this basename: POCBNP,  CBG: No results found for this basename: GLUCAP,  in the last 168 hours  Radiological Exams on Admission: Dg Ankle 2 Views Left  12/01/2012   *RADIOLOGY REPORT*  Clinical Data: Fixation left ankle fracture.  DG C-ARM 1-60 MIN, LEFT ANKLE - 2 VIEW  Technique: Two fluoroscopic spot views of the left ankle are provided.  Comparison:  Plain films 11/26/2012.  Findings: Images demonstrate placement of a lateral plate screws and for fixation of a distal fibular fracture.  Medial malleolar fracture is fixed with a K-wire and single screw.  Position and alignment appear anatomic.  Calcaneal spur noted.  IMPRESSION: ORIF medial and lateral  malleolar fractures.   Original Report Authenticated By: Holley Dexter, M.D.   Dg C-arm 1-60 Min  12/01/2012   *RADIOLOGY REPORT*  Clinical Data: Fixation left ankle fracture.  DG C-ARM 1-60 MIN, LEFT ANKLE - 2 VIEW  Technique: Two fluoroscopic spot views of the left ankle are provided.  Comparison:  Plain films 11/26/2012.  Findings: Images demonstrate placement of a lateral plate screws and for fixation of a distal fibular fracture.  Medial malleolar fracture is fixed with a K-wire and single screw.  Position and alignment appear anatomic.  Calcaneal spur noted.  IMPRESSION: ORIF medial and lateral malleolar fractures.   Original Report Authenticated By: Holley Dexter, M.D.    EKG: Independently reviewed.   Time spent: 65 minutes  Gwenyth Bender [facility] Pager [phone]  If 7PM-7AM, please contact night-coverage [email or web address] Password [facility] 12/01/2012, 3:58 PM

## 2012-12-01 NOTE — Progress Notes (Signed)
The History and Physical is unchanged. I have examined the patient. The patient is medically able to have surgery on the left ankle . [name] 

## 2012-12-01 NOTE — Op Note (Signed)
NAMESHARETTA, [name]             ACCOUNT NO.:  [id number]  MEDICAL RECORD NO.:  [id number]  LOCATION:  [facility]                          FACILITY:  [facility]  PHYSICIAN:  J. Darreld Mclean, M.D. DATE OF BIRTH:  03/26/1966  DATE OF PROCEDURE: DATE OF DISCHARGE:                              OPERATIVE REPORT   PREOPERATIVE DIAGNOSIS:  Trimalleolar fracture of the ankle on the left.  POSTOPERATIVE DIAGNOSIS:  Trimalleolar fracture of the ankle on the left.  PROCEDURE:  Open treatment and reduction of left trimalleolar fracture.  ANESTHESIA:  Spinal.  Posterior splint applied.  No drains.  No blood given.  SURGEON:  J. Darreld Mclean, MD  ASSISTANT:  Dialysis RN.  INDICATIONS:  The patient fell approximately 2 weeks ago and injured her left ankle.  She was subsequently admitted to the hospital in acute respiratory failure.  She has history of chronic respiratory problems. She is morbidly obese.  She is hypertensive.  She has got medical problems.  She is in intensive care unit for several days.  Her trauma and fracture at that time was nondisplaced and she was put in a posterior splint and remained nondisplaced while she was in the hospital.  However, she went home, she put weight on it.  Fractures have slipped.  She missed several appointments to my office.  When I finally saw her, we changed her to a Cam walker.  She declined surgery at that time.  I saw her back several days later.  I strongly recommended surgery as the fracture continued to displace.  She reluctantly agreed to the procedure.  She is at very high risk for surgery because of her morbid obesity and her severe pulmonary status.  We have arranged for an ICU bed postoperatively.  The hospitalist is aware.  Dr. Catalina Pizza is her family doctor.  DESCRIPTION OF PROCEDURE:  The patient was seen in the holding area. The left ankle identified as correct surgical site.  I placed a mark on the left ankle.  She was brought to the  operating room and given spinal anesthesia.  She was placed supine.  Tourniquet deflated left upper thigh.  She was prepped and draped in the usual manner.  We had a time- out.  The radiology equipment, C-arm was in place.  I went ahead on lead apron, lead shields, x-ray batches.  Machine was working properly.  All instrumentation was properly positioned and working.  OR team knew each other.  The patient was identified and the left ankle was identified as correct surgical site.  I concluded time-out procedure.  Leg was elevated, wrapped circumferentially with Esmarch bandage. Tourniquet inflated to 300 mmHg.  Esmarch bandage removed.  Midline incision was made and the fracture was identified and held in place with a 0.062 smooth Kirschner wire.  A 3.2 mm drill was then used and a 4.5 mm, 55 mm long malleolar screw was inserted.  X-ray showed good positioning and reduction of the fracture medially.  Medial wound was reapproximated using 2-0 plain, 2-0 chromic.  Attention directed to the lateral side.  Incision was made.  Fracture was identified and a 5 hole plate was placed.  Screws measured from two 16  screws proximally and the 2nd from the most proximal interlocking screw and then 20 mm screw.  The most inferior screw was an 18 cancellous and we used an 18 cortical screw. The patient's x-rays looked good.  Permanent x-rays were taken. AP and lateral views showed reduction of the fracture.  A small posterior malleolar piece did not need surgical fixation.  Wounds were then reapproximated again using 2-0 chromic, 2-0 plain, and skin staples.  Tourniquet deflated for 39 minutes.  Sterile dressing applied. Posterior plaster splint applied.  The patient tolerated the procedure well, will go to recovery and then to the intensive care unit as stated previously.          ______________________________ Shela Commons. Darreld Mclean, M.D.     JWK/MEDQ  D:  12/01/2012  T:  12/01/2012  Job:  [id number]

## 2012-12-01 NOTE — Transfer of Care (Signed)
Immediate Anesthesia Transfer of Care Note  Patient: [name]  Procedure(s) Performed: Procedure(s): OPEN REDUCTION INTERNAL FIXATION (ORIF) ANKLE FRACTURE (Left)  Patient Location: PACU  Anesthesia Type:Spinal  Level of Consciousness: awake, alert  and oriented  Airway & Oxygen Therapy: Patient Spontanous Breathing and Patient connected to face mask oxygen  Post-op Assessment: Report given to PACU RN  Post vital signs: Reviewed and stable  Complications: No apparent anesthesia complications

## 2012-12-01 NOTE — Consult Note (Signed)
Pt seen, examined & d/w my Nurse practitioner.  Agree with above.  She has cardiomegaly seen on recent CXR's which from a few years ago is new.  Strongly suspect she may have some component of failure from cor pulmonale/non compliance.  Agree with holding her lasix in lieu of low BP, but given IVF in OR, will check BNP and echo.

## 2012-12-01 NOTE — Anesthesia Preprocedure Evaluation (Signed)
Anesthesia Evaluation  Patient identified by MRN, date of birth, ID band Patient awake    Reviewed: Allergy & Precautions, H&P , NPO status , Patient's Chart, lab work & pertinent test results  Airway Mallampati: III TM Distance: >3 FB   Mouth opening: Limited Mouth Opening  Dental  (+) Teeth Intact   Pulmonary shortness of breath, asthma , sleep apnea , COPDCurrent Smoker,    + decreased breath sounds+ wheezing      Cardiovascular hypertension, Pt. on medications Rhythm:Regular Rate:Normal     Neuro/Psych PSYCHIATRIC DISORDERS Depression    GI/Hepatic   Endo/Other  Hypothyroidism Morbid obesity  Renal/GU      Musculoskeletal   Abdominal   Peds  Hematology   Anesthesia Other Findings   Reproductive/Obstetrics                           Anesthesia Physical Anesthesia Plan  ASA: IV  Anesthesia Plan: Spinal   Post-op Pain Management:    Induction:   Airway Management Planned: Simple Face Mask  Additional Equipment:   Intra-op Plan:   Post-operative Plan:   Informed Consent: I have reviewed the patients History and Physical, chart, labs and discussed the procedure including the risks, benefits and alternatives for the proposed anesthesia with the patient or authorized representative who has indicated his/her understanding and acceptance.     Plan Discussed with:   Anesthesia Plan Comments: (Discussed possible GOT-RSI and post op vent support, and pt agrees with plan.)        Anesthesia Quick Evaluation

## 2012-12-02 ENCOUNTER — Encounter (HOSPITAL_COMMUNITY): Payer: Self-pay | Admitting: General Practice

## 2012-12-02 DIAGNOSIS — I10 Essential (primary) hypertension: Secondary | ICD-10-CM

## 2012-12-02 DIAGNOSIS — S81801A Unspecified open wound, right lower leg, initial encounter: Secondary | ICD-10-CM | POA: Diagnosis present

## 2012-12-02 DIAGNOSIS — R0902 Hypoxemia: Secondary | ICD-10-CM

## 2012-12-02 DIAGNOSIS — B372 Candidiasis of skin and nail: Secondary | ICD-10-CM | POA: Diagnosis present

## 2012-12-02 DIAGNOSIS — I519 Heart disease, unspecified: Secondary | ICD-10-CM

## 2012-12-02 LAB — CBC WITH DIFFERENTIAL/PLATELET
Basophils Relative: 0 % (ref 0–1)
Eosinophils Absolute: 0.2 10*3/uL (ref 0.0–0.7)
Hemoglobin: 14.8 g/dL (ref 12.0–15.0)
MCH: 31.8 pg (ref 26.0–34.0)
MCHC: 32.5 g/dL (ref 30.0–36.0)
Monocytes Relative: 6 % (ref 3–12)
Neutrophils Relative %: 80 % — ABNORMAL HIGH (ref 43–77)
Platelets: 223 10*3/uL (ref 150–400)

## 2012-12-02 LAB — BASIC METABOLIC PANEL
BUN: 17 mg/dL (ref 6–23)
Calcium: 9.5 mg/dL (ref 8.4–10.5)
GFR calc non Af Amer: 72 mL/min — ABNORMAL LOW (ref >90)
Glucose, Bld: 165 mg/dL — ABNORMAL HIGH (ref 70–99)

## 2012-12-02 MED ORDER — PNEUMOCOCCAL VAC POLYVALENT 25 MCG/0.5ML IJ INJ
0.5000 mL | INJECTION | INTRAMUSCULAR | Status: AC
  Administered 2012-12-03: 0.5 mL via INTRAMUSCULAR
  Filled 2012-12-02 (×2): qty 0.5

## 2012-12-02 MED ORDER — BUPIVACAINE IN DEXTROSE 0.75-8.25 % IT SOLN: INTRATHECAL | Status: DC | PRN

## 2012-12-02 MED ORDER — BUPIVACAINE IN DEXTROSE 0.75-8.25 % IT SOLN
INTRATHECAL | Status: DC | PRN
  Administered 2012-12-01: 2 mL via INTRATHECAL

## 2012-12-02 MED ORDER — NYSTATIN 100000 UNIT/GM EX POWD
Freq: Two times a day (BID) | CUTANEOUS | Status: DC
  Administered 2012-12-02 – 2012-12-03 (×3): via TOPICAL
  Filled 2012-12-02: qty 15

## 2012-12-02 MED ORDER — SULFAMETHOXAZOLE-TRIMETHOPRIM 400-80 MG PO TABS
1.0000 | ORAL_TABLET | Freq: Two times a day (BID) | ORAL | Status: DC
  Filled 2012-12-02 (×3): qty 1

## 2012-12-02 MED ORDER — DOXYCYCLINE HYCLATE 100 MG PO TABS
100.0000 mg | ORAL_TABLET | Freq: Two times a day (BID) | ORAL | Status: DC
  Administered 2012-12-02 – 2012-12-03 (×3): 100 mg via ORAL
  Filled 2012-12-02 (×3): qty 1

## 2012-12-02 MED ORDER — SULFAMETHOXAZOLE-TMP DS 800-160 MG PO TABS
0.5000 | ORAL_TABLET | Freq: Two times a day (BID) | ORAL | Status: DC
  Administered 2012-12-02 – 2012-12-03 (×3): 0.5 via ORAL
  Filled 2012-12-02 (×3): qty 1

## 2012-12-02 NOTE — Consult Note (Signed)
Patient seen, examined and discussed with my nurse practitioner. Agree with above. Her echocardiogram was unremarkable fortunately. Have wound care looking at her heel ulcers. Social worker working on rehabilitation options. Meantime breathing okay. Watching for depression.

## 2012-12-02 NOTE — Progress Notes (Signed)
UR Chart Review Completed  

## 2012-12-02 NOTE — Consult Note (Signed)
[facility] PROGRESS NOTE  [name] WUJ:[id number] DOB: [birth year] DOA: 12/01/2012 PCP: Catalina Pizza, MD  Assessment/Plan: Trimalleolar fracture of ankle, closed: s/p ORIF 12/01/12. Per orthopedics  Active Problems:    Acute on chronic respiratory failure: Related to COPD and Obesity hypoventilation syndrome in setting of ORIF. Patient does wear oxygen at home. Sats 88-89% on 4-5L [place]. Continue nebs, encourage IS. Echo yields 60% EF with normal systolic function.  Home meds include 25mg  HCTZ daily. Continue to hold today for slightly soft BP. Anticipate resuming soon.   Obstructive sleep apnea: Patient has been scheduled for outpatient sleep study on 2 separate occasions since her last hospitalization in May. She was unable to keep either of those appointments. CPAP per pharmacy   Obesity hypoventilation syndrome: See #1 and #2 .   COPD presumed. No documentation of pulmonary function tests. Since seen 1 month ago  for acute on chronic respiratory failure. Patient is on oxygen at home.  Faint wheezes. Continue scheduled nebulizers.   Morbid obesity: BMI 54.2. We'll request a nutritional consult   Essential hypertension, benign: remains only slightly soft. Continue to hold low losartain and HCTZ. Monitor closely.   Hypothyroidism; TSH in May of this year was 24.6. Synthroid increased at that time to 200 MCG. TSH 3.57. Continue Synthroid at current dose.   Hyperlipidemia Continue fenofibrate.   Depression stable at baseline.  Yeast like rash groin area: nystatin powder  Left lower extremity wound: has had since 12/13. Pt reports it has healed in past and then "scab gets knocked off" . Small amount purulent drainage today. Start doxy  Hyponatremia: mild. Likely related to decreased po intake from surgery. Will monitor.   Code Status: full Family Communication:  Disposition Plan: home when ready   Consultants:    Procedures:  ORIF left ankle  12/01/12  Antibiotics:  Doxycycline 12/02/12>>  HPI/Subjective: Sitting up in bed sleeping easily aroused. No complaints  Objective: Filed Vitals:   12/02/12 0600 12/02/12 0700 12/02/12 0738 12/02/12 0800  BP: 107/60 121/59  104/46  Pulse:      Temp:      TempSrc:      Resp: 18 17  19   Height:      Weight:      SpO2:   89%     Intake/Output Summary (Last 24 hours) at 12/02/12 0825 Last data filed at 12/02/12 0800  Gross per 24 hour  Intake 3524.25 ml  Output    600 ml  Net 2924.25 ml   Filed Weights   12/01/12 0704 12/02/12 0351  Weight: 147.419 kg (325 lb) 156.5 kg (345 lb 0.3 oz)    Exam:   General:  Morbidly obese face flushed  Cardiovascular: RRR No MGR trace LE edema  Respiratory: mild increased work of breathing. BS distant, faint expiratory wheeze bases. No crackles  Abdomen: obese soft +BS non-tender to palpation  Musculoskeletal: dressing left ankle dry and intact, toes warm to touch  Skin: yeast like rash skin folds lower abdomen and groin, dime size wound anterior right LE. Erythema around wound. Center tissue creamy with small amount drainage, no odor.    Data Reviewed: Basic Metabolic Panel:  Recent Labs Lab 11/29/12 1425 12/02/12 0435  NA 135 133*  K 4.2 4.1  CL 92* 92*  CO2 31 33*  GLUCOSE 123* 165*  BUN 13 17  CREATININE 0.56 0.92  CALCIUM 9.6 9.5   Liver Function Tests:  Recent Labs Lab 11/29/12 1425  AST 29  ALT 23  ALKPHOS 76  BILITOT 0.4  PROT 7.5  ALBUMIN 3.7   No results found for this basename: LIPASE, AMYLASE,  in the last 168 hours No results found for this basename: AMMONIA,  in the last 168 hours CBC:  Recent Labs Lab 11/29/12 1425 12/02/12 0435  WBC 7.1 9.4  NEUTROABS 4.7 7.5  HGB 15.6* 14.8  HCT 44.9 45.5  MCV 92.8 97.6  PLT 238 223   Cardiac Enzymes: No results found for this basename: CKTOTAL, CKMB, CKMBINDEX, TROPONINI,  in the last 168 hours BNP (last 3 results)  Recent Labs  12/01/12 1614   PROBNP 84.6   CBG: No results found for this basename: GLUCAP,  in the last 168 hours  Recent Results (from the past 240 hour(s))  SURGICAL PCR SCREEN     Status: Abnormal   Collection Time    11/29/12  2:45 PM      Result Value Range Status   MRSA, PCR NEGATIVE  NEGATIVE Final   Staphylococcus aureus POSITIVE (*) NEGATIVE Final   Comment:            The Xpert SA Assay (FDA     approved for NASAL specimens     in patients over 69 years of age),     is one component of     a comprehensive surveillance     program.  Test performance has     been validated by [employer] for patients greater     than or equal to 74 year old.     It is not intended     to diagnose infection nor to     guide or monitor treatment.     RESULT CALLED TO, READ BACK BY AND VERIFIED WITH:     LEFT MESSAGE FOR KIM FAINT AT 1902 ON 11/29/2012 BY BAUGHAM,M.     Studies: Dg Ankle 2 Views Left  12/01/2012   *RADIOLOGY REPORT*  Clinical Data: Fixation left ankle fracture.  DG C-ARM 1-60 MIN, LEFT ANKLE - 2 VIEW  Technique: Two fluoroscopic spot views of the left ankle are provided.  Comparison:  Plain films 11/26/2012.  Findings: Images demonstrate placement of a lateral plate screws and for fixation of a distal fibular fracture.  Medial malleolar fracture is fixed with a K-wire and single screw.  Position and alignment appear anatomic.  Calcaneal spur noted.  IMPRESSION: ORIF medial and lateral malleolar fractures.   Original Report Authenticated By: Holley Dexter, M.D.   Dg C-arm 1-60 Min  12/01/2012   *RADIOLOGY REPORT*  Clinical Data: Fixation left ankle fracture.  DG C-ARM 1-60 MIN, LEFT ANKLE - 2 VIEW  Technique: Two fluoroscopic spot views of the left ankle are provided.  Comparison:  Plain films 11/26/2012.  Findings: Images demonstrate placement of a lateral plate screws and for fixation of a distal fibular fracture.  Medial malleolar fracture is fixed with a K-wire and single screw.  Position and  alignment appear anatomic.  Calcaneal spur noted.  IMPRESSION: ORIF medial and lateral malleolar fractures.   Original Report Authenticated By: Holley Dexter, M.D.    Scheduled Meds: . albuterol  2.5 mg Nebulization QID  . antiseptic oral rinse  15 mL Mouth Rinse q12n4p  . aspirin EC  81 mg Oral Daily  . chlorhexidine  15 mL Mouth Rinse BID  . Chlorhexidine Gluconate Cloth  6 each Topical Q0600  . doxycycline  100 mg Oral Q12H  . enoxaparin (LOVENOX) injection  40 mg Subcutaneous  Q24H  . fenofibrate  54 mg Oral Daily  . losartan  100 mg Oral Daily   And  . hydrochlorothiazide  25 mg Oral Daily  . HYDROmorphone PCA 0.3 mg/mL   Intravenous Q4H  . levothyroxine  200 mcg Oral QAC breakfast  . mupirocin ointment   Nasal Daily  . nystatin   Topical BID  . [START ON 12/03/2012] pneumococcal 23 valent vaccine  0.5 mL Intramuscular Tomorrow-1000  . sodium chloride  3 mL Intravenous Q12H  . zolpidem  5 mg Oral QHS   Continuous Infusions:   Principal Problem:   Trimalleolar fracture of ankle, closed Active Problems:   Obesity hypoventilation syndrome   Morbid obesity   Essential hypertension, benign   Other and unspecified hyperlipidemia   Unspecified hypothyroidism   Chronic respiratory failure   Obstructive sleep apnea   Severe obesity (BMI >= 40)   Yeast dermatitis   Wound of right lower extremity    Time spent: 35 minutes    [facility] M  [facility] Pager [phone]. If 7PM-7AM, please contact night-coverage at [email or web address], password [facility] 12/02/2012, 8:25 AM  LOS: 1 day

## 2012-12-02 NOTE — Progress Notes (Signed)
Subjective: 1 Day Post-Op Procedure(s) (LRB): OPEN REDUCTION INTERNAL FIXATION (ORIF) ANKLE FRACTURE (Left) Patient reports pain as 4 on 0-10 scale.    Objective: Vital signs in last 24 hours: Temp:  [97.8 F (36.6 C)-98.4 F (36.9 C)] 98.4 F (36.9 C) (06/05 0800) Pulse Rate:  [57-81] 57 (06/04 1638) Resp:  [9-27] 19 (06/05 0800) BP: (76-129)/(46-91) 104/46 mmHg (06/05 0800) SpO2:  [88 %-99 %] 89 % (06/05 0738) FiO2 (%):  [44 %] 44 % (06/04 2000) Weight:  [156.5 kg (345 lb 0.3 oz)] 156.5 kg (345 lb 0.3 oz) (06/05 0351)  Intake/Output from previous day: 06/04 0701 - 06/05 0700 In: 3264.3 [P.O.:1440; I.V.:1824.3] Out: 600 [Urine:600] Intake/Output this shift: Total I/O In: 260 [P.O.:240; I.V.:20] Out: -    Recent Labs  11/29/12 1425 12/02/12 0435  HGB 15.6* 14.8    Recent Labs  11/29/12 1425 12/02/12 0435  WBC 7.1 9.4  RBC 4.84 4.66  HCT 44.9 45.5  PLT 238 223    Recent Labs  11/29/12 1425 12/02/12 0435  NA 135 133*  K 4.2 4.1  CL 92* 92*  CO2 31 33*  BUN 13 17  CREATININE 0.56 0.92  GLUCOSE 123* 165*  CALCIUM 9.6 9.5   No results found for this basename: LABPT, INR,  in the last 72 hours  Neurologically intact ABD soft Neurovascular intact Intact pulses distally Posterior splint intact.  Physical therapy has not seen yet.  She has no insurance and possibility of nursing home placement is remote but they will check on it.  She is breathing well.  She could go to floor later if continues to do well.  Assessment/Plan: 1 Day Post-Op Procedure(s) (LRB): OPEN REDUCTION INTERNAL FIXATION (ORIF) ANKLE FRACTURE (Left) Up with therapy  [name] 12/02/2012, 9:25 AM

## 2012-12-02 NOTE — Addendum Note (Signed)
Addendum created 12/02/12 1555 by Moshe Salisbury, CRNA   Modules edited: Anesthesia Medication Administration

## 2012-12-02 NOTE — Progress Notes (Signed)
*  PRELIMINARY RESULTS* Echocardiogram 2D Echocardiogram has been performed.  [name] 12/02/2012, 9:51 AM

## 2012-12-02 NOTE — Progress Notes (Signed)
PT BEING TRANSFERRED TO ROOM 327. PT ALERT AND ORIENTED. O2 IN PROGRESS AT 5L/MIN VIA [place]. PT CONTINUES TO USE PCA PUMP FOR IV DILAUDID. LT ANKLE REMAINS ELEVATED ON PILLOW W/ ICE PACK IN PLACE. LT FOOT TOES AND WARM AND PINK. LT WRIST IV PATENT. TRANSFER REPORT CALLED TO CECILI RN ON 300. PT TRANSFER IN BED W/ TRAPEZ BAR IN PLACE.

## 2012-12-02 NOTE — Progress Notes (Signed)
Staff member from [facility] in to discuss pt going to [facility] for rehab after discharge from [facility] seems agreeable to this.

## 2012-12-02 NOTE — Clinical Social Work Placement (Signed)
    Clinical Social Work Department CLINICAL SOCIAL WORK PLACEMENT NOTE 12/03/2012  Patient:  MILLIANA, REDDOCH  Account Number:  [id number] Admit date:  12/01/2012  Clinical Social Worker:  Santa Genera, CLINICAL SOCIAL WORKER  Date/time:  12/02/2012 11:00 AM  Clinical Social Work is seeking post-discharge placement for this patient at the following level of care:   SKILLED NURSING   (*CSW will update this form in Epic as items are completed)   12/02/2012  Patient/family provided with [facility] Department of Clinical Social Work's list of facilities offering this level of care within the geographic area requested by the patient (or if unable, by the patient's family).  12/02/2012  Patient/family informed of their freedom to choose among providers that offer the needed level of care, that participate in Medicare, Medicaid or managed care program needed by the patient, have an available bed and are willing to accept the patient.  12/02/2012  Patient/family informed of [facility]' ownership interest in [facility], as well as of the fact that they are under no obligation to receive care at this facility.  PASARR submitted to EDS on 12/02/2012 PASARR number received from EDS on   FL2 transmitted to all facilities in geographic area requested by pt/family on  12/02/2012 FL2 transmitted to all facilities within larger geographic area on   Patient informed that his/her managed care company has contracts with or will negotiate with  certain facilities, including the following:     Patient/family informed of bed offers received:  12/03/2012 Patient chooses bed at [facility] [place] Physician recommends and patient chooses bed at    Patient to be transferred to [facility] [place] on  12/03/2012 Patient to be transferred to facility by [facility]  The following physician request were entered in Epic:   Additional Comments: Patient requires LOG, states she has  Medicaid application pending. Confirmed patient applied for Medicaid in [facility] 11-18-12, per [facility] worker, she must also apply for disability.  Patient says she has completed SSI disability app.  LOG request emailed to [employer], awaiting confirmation of LOG.  Santa Genera, LCSW Clinical Social Worker [phone])

## 2012-12-02 NOTE — Clinical Social Work Note (Signed)
Per B Ratliffe, patient has Medicaid application pending w [place] DSS.  Worker is Dorcas Mcmurray [phone]).  Santa Genera, LCSW Clinical Social Worker [phone])

## 2012-12-02 NOTE — Clinical Social Work Psychosocial (Signed)
    Clinical Social Work Department BRIEF PSYCHOSOCIAL ASSESSMENT 12/02/2012  Patient:  Julie, Kent     Account Number:  [id number]     Admit date:  12/01/2012  Clinical Social Worker:  Santa Genera, CLINICAL SOCIAL WORKER  Date/Time:  12/02/2012 10:00 AM  Referred by:  Physician  Date Referred:  12/01/2012 Referred for  SNF Placement   Other Referral:   Interview type:  Patient Other interview type:    PSYCHOSOCIAL DATA Living Status:  FRIEND(S) Admitted from facility:   Level of care:   Primary support name:  [name] Primary support relationship to patient:  FRIEND Degree of support available:   Patient states Mr Durwin Glaze is able to assist her w physical needs, provides full support for patient.    CURRENT CONCERNS Current Concerns  Post-Acute Placement   Other Concerns:    SOCIAL WORK ASSESSMENT / PLAN CSW met w patient at bedside, patient states she lives w female friend who provides all her support from his disability check.  Says her friend is physically able to assist her w all her ADLs.  Says her apartment is equipped w a ramp, on one floor, but is not handicapped equipped in the bathroom.  Patient's current fall occured when she was in the bathroom and tripped over a towel.    Patient has applied for Medicaid w assistance from [facility] social worker, says she has completed all paperwork. Patient also says she has applied for SSI Disability recently but has not been granted disability at this time.    Patient says she has transportation through a friend who drives her to appointments.  However, MD documents that patient has frequently missed appointments.  Patient denies missing appointments other than one for a CPAP evaluation.    Patient says she may need 2 weeks of rehab in order to help her regain function post ankle fracture surgery. Previously had broken other ankle and has learned how to use nonfractured leg to ambulate.  Says she has a Cytogeneticist at  bedside.    Discussed SNF placement process, patient willing to go to SNF out of town if necessary.   Assessment/plan status:  Psychosocial Support/Ongoing Assessment of Needs Other assessment/ plan:   Information/referral to community resources:   SNF list  [employer] list    PATIENT'S/FAMILY'S RESPONSE TO PLAN OF CARE: Patient appreciative of help w SNF placement for rehab        Santa Genera, LCSW Clinical Social Worker [phone])

## 2012-12-02 NOTE — Evaluation (Deleted)
Physical Therapy Evaluation Patient Details Name: [name] MRN: [id number] DOB: 05/13/1966 Today's Date: 12/02/2012 Time: 1478-2956 PT Time Calculation (min): 45 min  PT Assessment / Plan / Recommendation Clinical Impression  Pt was seen for evaluation following OTIF of left ankle.  She is morbidly obese with baseline COPD and is O2 dependent on 2 L O2.  Currently, pain is fairly well controlled with PCA.  She needed moderate assist to transfer supine to sit and max assist to stand from sitting at chair height. She was able to use a walker to pivot bed to chair with SBA, maintaining true NWB left.  Because of her morbid obesity, I am not sure that she will be able to ambulate functionally NWB.  I am recommending SNF at d/c.     PT Assessment  Patient needs continued PT services    Follow Up Recommendations       Does the patient have the potential to tolerate intense rehabilitation      Barriers to Discharge        Equipment Recommendations       Recommendations for Other Services     Frequency      Precautions / Restrictions Precautions Precautions: Fall Restrictions Weight Bearing Restrictions: Yes LLE Weight Bearing: Non weight bearing   Pertinent Vitals/Pain       Mobility  Bed Mobility Bed Mobility: Supine to Sit;Sit to Supine Supine to Sit: 3: Mod assist;HOB elevated Sit to Supine: 4: Min assist;HOB flat Transfers Transfers: Stand Pivot Transfers Sit to Stand: 2: Max assist;With upper extremity assist (from non elevated seat height) Stand to Sit: 4: Min assist;With upper extremity assist Stand Pivot Transfers: 4: Min guard (using walker) Lateral/Scoot Transfers: 4: Min guard Ambulation/Gait Ambulation/Gait Assistance: Not tested (comment) (unable)    Exercises     PT Diagnosis:    PT Problem List:   PT Treatment Interventions:     PT Goals Acute Rehab PT Goals PT Goal Formulation: With patient Time For Goal Achievement: 12/16/12 Potential  to Achieve Goals: Good Pt will go Supine/Side to Sit: with HOB 0 degrees;with min assist PT Goal: Supine/Side to Sit - Progress: Goal set today Pt will go Sit to Supine/Side: with supervision;with HOB 0 degrees PT Goal: Sit to Supine/Side - Progress: Goal set today Pt will go Sit to Stand: with min assist;with upper extremity assist (from normal chair height) PT Goal: Sit to Stand - Progress: Goal set today Pt will go Stand to Sit: with supervision;with upper extremity assist PT Goal: Stand to Sit - Progress: Goal set today Pt will Transfer Bed to Chair/Chair to Bed: with min assist (without walker) PT Transfer Goal: Bed to Chair/Chair to Bed - Progress: Goal set today  Visit Information  Last PT Received On: 12/02/12    Subjective Data  Subjective: no commments Patient Stated Goal: none stated   Prior Functioning  Home Living Lives With: Significant other Available Help at Discharge: Family;Available 24 hours/day Type of Home: Apartment Home Access: Ramped entrance Home Layout: One level Bathroom Shower/Tub: Engineer, manufacturing systems: Standard Home Adaptive Equipment: Bedside commode/3-in-1;Walker - rolling;Shower chair without back Additional Comments: pt had borrowed a w/c but is having to return it at this time Prior Function Level of Independence: Needs assistance Needs Assistance: Bathing;Dressing;Light Housekeeping;Meal Prep Bath: Moderate Dressing: Moderate Meal Prep: Maximal Light Housekeeping: Maximal Able to Take Stairs?: No Driving: No Vocation: Unemployed Communication Communication: No difficulties    Cognition  Cognition Arousal/Alertness: Awake/alert Behavior During Therapy: [facility]  for tasks assessed/performed Overall Cognitive Status: Within Functional Limits for tasks assessed    Extremity/Trunk Assessment Right Lower Extremity Assessment RLE ROM/Strength/Tone: Within functional levels RLE Sensation: WFL - Light Touch RLE Coordination: WFL -  gross motor Left Lower Extremity Assessment LLE ROM/Strength/Tone: Unable to fully assess;Due to pain   Balance Balance Balance Assessed: Yes Dynamic Standing Balance Dynamic Standing - Balance Support: Bilateral upper extremity supported Dynamic Standing - Level of Assistance: 5: Stand by assistance  End of Session PT - End of Session Equipment Utilized During Treatment: Gait belt Activity Tolerance: Patient tolerated treatment well;Patient limited by fatigue Patient left: in bed;with call bell/phone within reach;with bed alarm set;with nursing in room Nurse Communication: Mobility status  GP     [name] 12/02/2012, 11:00 AM

## 2012-12-02 NOTE — Care Management Note (Signed)
    Page 1 of 1   12/03/2012     11:47:44 AM   CARE MANAGEMENT NOTE 12/03/2012  Patient:  [name], [name]   Account Number:  [id number]  Date Initiated:  12/02/2012  Documentation initiated by:  Anibal Henderson  Subjective/Objective Assessment:   admitted following surgery for ankle fracture. Admitted to ICU due to recent acute or chronic respiratory failure. Patient is on 2L O2 at home. patient lives with her boyfriend.     Action/Plan:   chair patient will be returning home with boyfriend, but would like to go to SNF  for 1 to 2 weeksfor therapy, prior to return home if possible. CSW aware  spoke with Dr. Hilda Lias and he would prefer that patient go to SNF, if possible, also   Anticipated DC Date:  12/04/2012   Anticipated DC Plan:  SKILLED NURSING FACILITY  In-house referral  Clinical Social Worker      DC Planning Services  CM consult      Choice offered to / List presented to:             Status of service:  Completed, signed off Medicare Important Message given?   (If response is "NO", the following Medicare IM given date fields will be blank) Date Medicare IM given:   Date Additional Medicare IM given:    Discharge Disposition:  SKILLED NURSING FACILITY  Per UR Regulation:  Reviewed for med. necessity/level of care/duration of stay  If discussed at Long Length of Stay Meetings, dates discussed:    Comments:  12/02/12/ 1400 Anibal Henderson RN If patient goes home she states boyfriend is able to help with her care. She has a BSC, shower chair, and walker. She also has a ramp to get into the house. She would need a large wheelchair with an elevated leg rest. [facility], and she will handle this if  patient chooses to go home. I Spoke with Dr. Hilda Lias, per his request with update on CM and  CSW discharge planning for patient 12/02/12 1000 Anibal Henderson RN/CM 12/03/12 Rosemary Holms RN BNS CM DC to [facility].

## 2012-12-02 NOTE — Evaluation (Signed)
Physical Therapy Evaluation Patient Details Name: [name] MRN: [id number] DOB: 05/02/1966 Today's Date: 12/02/2012 Time: 4098-1191 PT Time Calculation (min): 45 min  PT Assessment / Plan / Recommendation Clinical Impression  Pt was seen for evaluation following OTIF of left ankle.  She is morbidly obese with baseline COPD and is O2 dependent on 2 L O2.  Currently, pain is fairly well controlled with PCA.  She needed moderate assist to transfer supine to sit and max assist to stand from sitting at chair height. She was able to use a walker to pivot bed to chair with SBA, maintaining true NWB left.  Because of her morbid obesity, I am not sure that she will be able to ambulate functionally NWB.  I am recommending SNF at d/c.     PT Assessment  Patient needs continued PT services    Follow Up Recommendations  SNF    Does the patient have the potential to tolerate intense rehabilitation      Barriers to Discharge None      Equipment Recommendations  Wheelchair (measurements PT) (with elevating leg rests)    Recommendations for Other Services OT consult   Frequency Min 5X/week    Precautions / Restrictions Precautions Precautions: Fall Restrictions Weight Bearing Restrictions: Yes LLE Weight Bearing: Non weight bearing   Pertinent Vitals/Pain       Mobility  Bed Mobility Bed Mobility: Supine to Sit;Sit to Supine Supine to Sit: 3: Mod assist;HOB elevated Sit to Supine: 4: Min assist;HOB flat Transfers Transfers: Stand Pivot Transfers Sit to Stand: 2: Max assist;With upper extremity assist (from non elevated seat height) Stand to Sit: 4: Min assist;With upper extremity assist Stand Pivot Transfers: 4: Min guard (using walker) Lateral/Scoot Transfers: 4: Min guard Ambulation/Gait Ambulation/Gait Assistance: Not tested (comment) (unable)    Exercises     PT Diagnosis: Difficulty walking;Acute pain  PT Problem List: Decreased activity tolerance;Decreased  mobility;Decreased safety awareness;Decreased knowledge of precautions;Cardiopulmonary status limiting activity;Obesity;Pain PT Treatment Interventions: DME instruction;Functional mobility training;Therapeutic activities;Therapeutic exercise;Patient/family education   PT Goals Acute Rehab PT Goals PT Goal Formulation: With patient Time For Goal Achievement: 12/16/12 Potential to Achieve Goals: Good Pt will go Supine/Side to Sit: with HOB 0 degrees;with min assist PT Goal: Supine/Side to Sit - Progress: Goal set today Pt will go Sit to Supine/Side: with supervision;with HOB 0 degrees PT Goal: Sit to Supine/Side - Progress: Goal set today Pt will go Sit to Stand: with min assist;with upper extremity assist (from normal chair height) PT Goal: Sit to Stand - Progress: Goal set today Pt will go Stand to Sit: with supervision;with upper extremity assist PT Goal: Stand to Sit - Progress: Goal set today Pt will Transfer Bed to Chair/Chair to Bed: with min assist (without walker) PT Transfer Goal: Bed to Chair/Chair to Bed - Progress: Goal set today  Visit Information  Last PT Received On: 12/02/12    Subjective Data  Subjective: no commments Patient Stated Goal: none stated   Prior Functioning  Home Living Lives With: Significant other Available Help at Discharge: Family;Available 24 hours/day Type of Home: Apartment Home Access: Ramped entrance Home Layout: One level Bathroom Shower/Tub: Engineer, manufacturing systems: Standard Home Adaptive Equipment: Bedside commode/3-in-1;Walker - rolling;Shower chair without back Additional Comments: pt had borrowed a w/c but is having to return it at this time Prior Function Level of Independence: Needs assistance Needs Assistance: Bathing;Dressing;Light Housekeeping;Meal Prep Bath: Moderate Dressing: Moderate Meal Prep: Maximal Light Housekeeping: Maximal Able to Take Stairs?: No Driving:  No Vocation:  Unemployed Communication Communication: No difficulties    Cognition  Cognition Arousal/Alertness: Awake/alert Behavior During Therapy: WFL for tasks assessed/performed Overall Cognitive Status: Within Functional Limits for tasks assessed    Extremity/Trunk Assessment Right Lower Extremity Assessment RLE ROM/Strength/Tone: Within functional levels RLE Sensation: WFL - Light Touch RLE Coordination: WFL - gross motor Left Lower Extremity Assessment LLE ROM/Strength/Tone: Unable to fully assess;Due to pain   Balance Balance Balance Assessed: Yes Dynamic Standing Balance Dynamic Standing - Balance Support: Bilateral upper extremity supported Dynamic Standing - Level of Assistance: 5: Stand by assistance  End of Session PT - End of Session Equipment Utilized During Treatment: Gait belt Activity Tolerance: Patient tolerated treatment well;Patient limited by fatigue Patient left: in bed;with call bell/phone within reach;with bed alarm set;with nursing in room Nurse Communication: Mobility status  GP     [name] 12/02/2012, 11:37 AM

## 2012-12-02 NOTE — Progress Notes (Signed)
  RD consulted for nutrition education regarding weight loss.  Body mass index is 57.41 kg/(m^2). Pt meets criteria for extreme obesity Class III based on current BMI.  She reports her usual meal pattern as  2 meals daily (late breakfast and dinner). Favorite foods consists of Hamburger Helper, Chicken, potatoes and Dr. Reino Kent as primary beverage. Both she and husband shop for food and share meal preparation.They eat at home mostly.  RD provided "Weight Loss Tips" handout from the Academy of [employer]etics. Emphasized the importance of serving sizes and provided examples of correct portions of common foods. Discussed importance of controlled and consistent intake throughout the day. Provided examples of ways to balance meals/snacks and encouraged intake of high-fiber, whole grain complex carbohydrates. Emphasized the importance of hydration with calorie-free beverages and limiting sugar-sweetened beverages. Encouraged pt to discuss physical activity options with physician. Teach back method used.  Expect fair compliance. PT recommending SNF at discharge which may potentially delay feasibility for her to make diet/ lifestyle modifications.  Current diet order is Regular, patient is consuming approximately 100% of meals at this time. Labs and medications reviewed. No further nutrition interventions warranted at this time. RD contact information provided. If additional nutrition issues arise, please re-consult RD.  Royann Shivers MS,RD,LDN Office: [phone] Pager: [phone]

## 2012-12-02 NOTE — Clinical Social Work Note (Signed)
CSW reviewed consult, communicated home health needs to RN CM covering patient, also communicated w B Ratliffe to ask her to address patient insurance status and advise CSW in case patient wants to pursue SNF placement.    Santa Genera, LCSW Clinical Social Worker [phone])

## 2012-12-03 DIAGNOSIS — J9610 Chronic respiratory failure, unspecified whether with hypoxia or hypercapnia: Secondary | ICD-10-CM

## 2012-12-03 LAB — CBC WITH DIFFERENTIAL/PLATELET
Basophils Absolute: 0 10*3/uL (ref 0.0–0.1)
Eosinophils Absolute: 0.3 10*3/uL (ref 0.0–0.7)
Eosinophils Relative: 5 % (ref 0–5)
MCH: 32 pg (ref 26.0–34.0)
MCV: 97.2 fL (ref 78.0–100.0)
Monocytes Absolute: 0.4 10*3/uL (ref 0.1–1.0)
Platelets: 205 10*3/uL (ref 150–400)
RDW: 13.8 % (ref 11.5–15.5)

## 2012-12-03 LAB — BASIC METABOLIC PANEL
BUN: 13 mg/dL (ref 6–23)
CO2: 35 mEq/L — ABNORMAL HIGH (ref 19–32)
Calcium: 9.3 mg/dL (ref 8.4–10.5)
Creatinine, Ser: 0.62 mg/dL (ref 0.50–1.10)
Glucose, Bld: 136 mg/dL — ABNORMAL HIGH (ref 70–99)

## 2012-12-03 MED ORDER — ENOXAPARIN SODIUM 40 MG/0.4ML ~~LOC~~ SOLN: 40.0000 mg | SUBCUTANEOUS | Status: DC

## 2012-12-03 MED ORDER — HYDROCODONE-ACETAMINOPHEN 7.5-325 MG PO TABS: 1.0000 | ORAL_TABLET | ORAL | Status: DC | PRN

## 2012-12-03 MED ORDER — MUPIROCIN 2 % EX OINT: TOPICAL_OINTMENT | Freq: Every day | CUTANEOUS | Status: DC

## 2012-12-03 MED ORDER — DOXYCYCLINE HYCLATE 100 MG PO TABS: 100.0000 mg | ORAL_TABLET | Freq: Two times a day (BID) | ORAL | Status: DC

## 2012-12-03 MED ORDER — SULFAMETHOXAZOLE-TMP DS 800-160 MG PO TABS: 0.5000 | ORAL_TABLET | Freq: Two times a day (BID) | ORAL | Status: DC

## 2012-12-03 MED ORDER — NYSTATIN 100000 UNIT/GM EX POWD: CUTANEOUS | Status: DC

## 2012-12-03 MED ORDER — ALBUTEROL SULFATE HFA 108 (90 BASE) MCG/ACT IN AERS: 2.0000 | INHALATION_SPRAY | Freq: Four times a day (QID) | RESPIRATORY_TRACT | Status: DC | PRN

## 2012-12-03 NOTE — Progress Notes (Signed)
Pt discharged to [facility] today per Dr. Romeo Apple. Pt's IV site D/C'd and WNL. Report called to [facility], nurse at [facility]. Report received and understanding verbalized. Pt left floor via EMS stretcher in stable condition accompanied by EMS transporters.

## 2012-12-03 NOTE — Discharge Summary (Signed)
Physician Discharge Summary  Patient ID: [name] MRN: [id number] DOB/AGE: 49/15/1967 49 y.o.  Admit date: 12/01/2012 Discharge date: 12/03/2012  Admission Diagnoses: Left ankle trimalleolar fracture  Discharge Diagnoses:  Principal Problem:   Trimalleolar fracture of ankle, closed Active Problems:   Obesity hypoventilation syndrome   Morbid obesity   Essential hypertension, benign   Other and unspecified hyperlipidemia   Unspecified hypothyroidism   Chronic respiratory failure   Obstructive sleep apnea   Severe obesity (BMI >= 40)   Yeast dermatitis   Wound of right lower extremity   Discharged Condition: good  Hospital Course: The patient was admitted to the hospital for a trimalleolar fracture which was displaced. She was admitted under the direction of Dr. Hilda Lias. She underwent open treatment internal fixation of the left ankle tolerated that well. She has multiple medical problems including chronic respiratory disease which was treated during her hospital stay.  The patient is nonweightbearing her obesity is preventing her from advancing in rehabilitation. Therapy notes indicate patient's need for skilled nursing facility  Discharge Exam: Blood pressure 157/88, pulse 78, temperature 98.5 F (36.9 C), temperature source Oral, resp. rate 20, height 5\' 5"  (1.651 m), weight 330 lb 4 oz (149.8 kg), last menstrual period 10/28/2012, SpO2 90.00%. General appearance: alert, cooperative and morbidly obese Extremities: She is no swelling in the foot. She can move all of her toes normally. And shows intact sensation color is good capillary refill is excellent  Disposition: 06-Home-Health Care Svc  Discharge Orders   Future Orders Complete By Expires     Diet - low sodium heart healthy  As directed     Discharge instructions  As directed     Comments:      Non weight bearing    Increase activity slowly  As directed         Medication List    TAKE these medications        aspirin EC 81 MG tablet  Take 81 mg by mouth daily.     doxycycline 100 MG tablet  Commonly known as:  VIBRA-TABS  Take 1 tablet (100 mg total) by mouth every 12 (twelve) hours.     enoxaparin 40 MG/0.4ML injection  Commonly known as:  LOVENOX  Inject 0.4 mLs (40 mg total) into the skin daily.     fenofibrate micronized 130 MG capsule  Commonly known as:  ANTARA  Take 130 mg by mouth daily.     HYDROcodone-acetaminophen 7.5-325 MG per tablet  Commonly known as:  NORCO  Take 1 tablet by mouth every 4 (four) hours as needed for pain.     levothyroxine 200 MCG tablet  Commonly known as:  SYNTHROID, LEVOTHROID  Take 200 mcg by mouth daily.     losartan-hydrochlorothiazide 100-25 MG per tablet  Commonly known as:  HYZAAR  Take 1 tablet by mouth daily.     mupirocin ointment 2 %  Commonly known as:  BACTROBAN  Apply topically daily.     nystatin 100000 UNIT/GM Powd  Apply to area as needed     sulfamethoxazole-trimethoprim 800-160 MG per tablet  Commonly known as:  BACTRIM DS  Take 0.5 tablets by mouth every 12 (twelve) hours.           Follow-up Information   Follow up with Darreld Mclean, MD On 12/15/2012. (call [phone] this weekend for q"s)    Contact information:   [address] [place] [address] [phone]       Signed: Fuller Canada  12/03/2012, 11:35 AM

## 2012-12-03 NOTE — Progress Notes (Signed)
Patient seen, examined and also discussed with my nurse practitioner. Agree with above. Orthopedics planning for discharge today. Patient stable, encouraged to have followup appointments for her obstructive sleep apnea. Sodium continues to improve. Stable from medical standpoint for discharge. Will need close followup as outpatient.

## 2012-12-03 NOTE — Clinical Social Work Note (Signed)
Patient ready for discharge today, will transfer to [facility] w LOG funding and Medicaid application pending.  Patient agreeable to transfer.  Facility willing to accept patient, FL2 reviewed w RN and updated, discharge packet prepared and placed w shadow chart for transfer.  Patient will transfer via EMS this afternoon.  CSW signing off as no further SW needs identified.  Santa Genera, LCSW Clinical Social Worker [phone])

## 2012-12-03 NOTE — Progress Notes (Signed)
Unable to waste Dilaudid PCA in pyxis for unknown reason.  Wasted 4 ml's total of hydromorphone in sink in med prep room with Fara Chute, RN.

## 2012-12-03 NOTE — Progress Notes (Signed)
[facility] PROGRESS NOTE  [name] ZOX:[id number] DOB: 11-08-1965 DOA: 12/01/2012 PCP: Catalina Pizza, MD  Assessment/Plan: Trimalleolar fracture of ankle, closed: s/p ORIF 12/01/12. Per orthopedics Discharged to day Active Problems:  Acute on chronic respiratory failure: Related to COPD and Obesity hypoventilation syndrome in setting of ORIF. Patient does wear oxygen at home. Sats 90% on 4-5L [place]. Continue nebs, encourage IS. Echo yields 60% EF with normal systolic function. Home meds include 25mg  HCTZ daily. Resume at discharge   Obstructive sleep apnea: Patient has been scheduled for outpatient sleep study on 2 separate occasions since her last hospitalization in May. She was unable to keep either of those appointments. CPAP at night  Obesity hypoventilation syndrome: See #1 and #2 .   COPD presumed. No documentation of pulmonary function tests. Since seen 1 month ago for acute on chronic respiratory failure. Patient is on oxygen at home.    Morbid obesity: BMI 54.2. Appreciate nutritional consult   Essential hypertension, stable at discharge. Resume home meds   Hypothyroidism; TSH in May of this year was 24.6. Synthroid increased at that time to 200 MCG. TSH 3.57. Continue Synthroid at current dose.   Hyperlipidemia Continue fenofibrate.   Depression stable at baseline.   Yeast like rash groin area: nystatin powder   Left lower extremity wound: has had since 12/13. Pt reports it has healed in past and then "scab gets knocked off" . Small amount purulent drainage today. Started doxy   Hyponatremia: mild. Likely related to decreased po intake from surgery. Trending up at discharge    1. **  Code Status: full Family Communication:  Disposition Plan: facility   Consultants:    Procedures:  ORIF left ankle  Antibiotics:  Doxy 12/02/12  HPI/Subjective: Denies pain/discomfort/sob.   Objective: Filed Vitals:   12/03/12 0546 12/03/12 0703 12/03/12 0832  12/03/12 1047  BP: 157/88     Pulse: 78     Temp: 98.5 F (36.9 C)     TempSrc: Oral     Resp: 9  20   Height:      Weight:      SpO2: 87% 90% 90% 90%    Intake/Output Summary (Last 24 hours) at 12/03/12 1136 Last data filed at 12/03/12 1037  Gross per 24 hour  Intake   1540 ml  Output    650 ml  Net    890 ml   Filed Weights   12/01/12 0704 12/02/12 0351 12/02/12 1608  Weight: 147.419 kg (325 lb) 156.5 kg (345 lb 0.3 oz) 149.8 kg (330 lb 4 oz)    Exam:   General:  Morbidly obese NAD  Cardiovascular: RRR No MGR   Respiratory: mild increased work of breathing BS distant but clear. No crackles no wheeze  Abdomen: obese soft +BS non-tender to palpation  Musculoskeletal: no clubbing no cyanosis   Data Reviewed: Basic Metabolic Panel:  Recent Labs Lab 11/29/12 1425 12/02/12 0435 12/03/12 0507  NA 135 133* 134*  K 4.2 4.1 3.7  CL 92* 92* 92*  CO2 31 33* 35*  GLUCOSE 123* 165* 136*  BUN 13 17 13   CREATININE 0.56 0.92 0.62  CALCIUM 9.6 9.5 9.3   Liver Function Tests:  Recent Labs Lab 11/29/12 1425  AST 29  ALT 23  ALKPHOS 76  BILITOT 0.4  PROT 7.5  ALBUMIN 3.7   No results found for this basename: LIPASE, AMYLASE,  in the last 168 hours No results found for this basename: AMMONIA,  in the last  168 hours CBC:  Recent Labs Lab 11/29/12 1425 12/02/12 0435 12/03/12 0507  WBC 7.1 9.4 7.0  NEUTROABS 4.7 7.5 4.9  HGB 15.6* 14.8 14.7  HCT 44.9 45.5 44.6  MCV 92.8 97.6 97.2  PLT 238 223 205   Cardiac Enzymes: No results found for this basename: CKTOTAL, CKMB, CKMBINDEX, TROPONINI,  in the last 168 hours BNP (last 3 results)  Recent Labs  12/01/12 1614  PROBNP 84.6   CBG: No results found for this basename: GLUCAP,  in the last 168 hours  Recent Results (from the past 240 hour(s))  SURGICAL PCR SCREEN     Status: Abnormal   Collection Time    11/29/12  2:45 PM      Result Value Range Status   MRSA, PCR NEGATIVE  NEGATIVE Final    Staphylococcus aureus POSITIVE (*) NEGATIVE Final   Comment:            The Xpert SA Assay (FDA     approved for NASAL specimens     in patients over 21 years of age),     is one component of     a comprehensive surveillance     program.  Test performance has     been validated by [employer] for patients greater     than or equal to 66 year old.     It is not intended     to diagnose infection nor to     guide or monitor treatment.     RESULT CALLED TO, READ BACK BY AND VERIFIED WITH:     LEFT MESSAGE FOR KIM FAINT AT 1902 ON 11/29/2012 BY BAUGHAM,M.     Studies: No results found.  Scheduled Meds: . albuterol  2.5 mg Nebulization QID  . antiseptic oral rinse  15 mL Mouth Rinse q12n4p  . aspirin EC  81 mg Oral Daily  . chlorhexidine  15 mL Mouth Rinse BID  . Chlorhexidine Gluconate Cloth  6 each Topical Q0600  . doxycycline  100 mg Oral Q12H  . enoxaparin (LOVENOX) injection  40 mg Subcutaneous Q24H  . fenofibrate  54 mg Oral Daily  . losartan  100 mg Oral Daily   And  . hydrochlorothiazide  25 mg Oral Daily  . HYDROmorphone PCA 0.3 mg/mL   Intravenous Q4H  . levothyroxine  200 mcg Oral QAC breakfast  . mupirocin ointment   Nasal Daily  . nystatin   Topical BID  . sodium chloride  3 mL Intravenous Q12H  . sulfamethoxazole-trimethoprim  0.5 tablet Oral Q12H  . zolpidem  5 mg Oral QHS   Continuous Infusions:   Principal Problem:   Trimalleolar fracture of ankle, closed Active Problems:   Obesity hypoventilation syndrome   Morbid obesity   Essential hypertension, benign   Other and unspecified hyperlipidemia   Unspecified hypothyroidism   Chronic respiratory failure   Obstructive sleep apnea   Severe obesity (BMI >= 40)   Yeast dermatitis   Wound of right lower extremity    Time spent: 30 minutes  Appreciate the opportunity to participate in her care. Will sign off.     [facility] M  [facility]  If 7PM-7AM, please contact night-coverage at  [email or web address], password [facility] 12/03/2012, 11:36 AM  LOS: 2 days

## 2012-12-03 NOTE — Progress Notes (Signed)
Subjective: 2 Days Post-Op Procedure(s) (LRB): OPEN REDUCTION INTERNAL FIXATION (ORIF) ANKLE FRACTURE (Left) Patient reports pain as 3 on 0-10 scale.    Objective: Vital signs in last 24 hours: Temp:  [98.1 F (36.7 C)-98.6 F (37 C)] 98.5 F (36.9 C) (06/06 0546) Pulse Rate:  [73-88] 78 (06/06 0546) Resp:  [9-22] 9 (06/06 0546) BP: (93-157)/(46-88) 157/88 mmHg (06/06 0546) SpO2:  [87 %-94 %] 90 % (06/06 0703) FiO2 (%):  [91 %-93 %] 91 % (06/05 2000) Weight:  [149.8 kg (330 lb 4 oz)] 149.8 kg (330 lb 4 oz) (06/05 1608)  Intake/Output from previous day: 06/05 0701 - 06/06 0700 In: 1860 [P.O.:1680; I.V.:180] Out: 550 [Urine:550] Intake/Output this shift:     Recent Labs  12/02/12 0435 12/03/12 0507  HGB 14.8 14.7    Recent Labs  12/02/12 0435 12/03/12 0507  WBC 9.4 7.0  RBC 4.66 4.59  HCT 45.5 44.6  PLT 223 205    Recent Labs  12/02/12 0435 12/03/12 0507  NA 133* 134*  K 4.1 3.7  CL 92* 92*  CO2 33* 35*  BUN 17 13  CREATININE 0.92 0.62  GLUCOSE 165* 136*  CALCIUM 9.5 9.3   No results found for this basename: LABPT, INR,  in the last 72 hours  Neurologically intact Neurovascular intact Sensation intact distally Intact pulses distally  Bed requests have been put in for SNF at various facilities.  She has no insurance, but I feel she would benefit from SNF placement secondary to her morbid obesity, severe pulmonary problem and her recent fracture.  She did only fair in physical therapy today.  Therapy will continue.  Her pain is well controlled and she has a positive attitude.  If no SNF bed can be located, she will go home with home health but I do not feel that would give the best possible treatment.  I will be out of town, Dr. Romeo Apple to follow for me.  Assessment/Plan: 2 Days Post-Op Procedure(s) (LRB): OPEN REDUCTION INTERNAL FIXATION (ORIF) ANKLE FRACTURE (Left) Up with therapy  [name] 12/03/2012, 7:37 AM

## 2012-12-03 NOTE — Progress Notes (Signed)
Pt's CPAP setting is 12. The back up rate is 12 and the Pt has been on 5L [place]. The Pt is a CO2 retainer. The pt is non-compliant.

## 2012-12-03 NOTE — Clinical Social Work Note (Signed)
Patient given bed offers - has bed at [facility] w LOG offered by [facility] SW Dept.  LOG in process w Holley Bouche, CSW.  [facility] agreeable to accepting patient.  Per [place] DSS Dorcas Mcmurray), patient has submitted Medicaid app on 11-18-12 so is Medicaid pending.  Santa Genera, LCSW Clinical Social Worker [phone])

## 2012-12-07 ENCOUNTER — Encounter (HOSPITAL_COMMUNITY): Payer: Self-pay | Admitting: Orthopaedic Surgery

## 2013-02-01 ENCOUNTER — Ambulatory Visit (HOSPITAL_COMMUNITY)
Admission: RE | Admit: 2013-02-01 | Discharge: 2013-02-01 | Disposition: A | Discharge status: Home / Self Care | Payer: Self-pay | Source: Ambulatory Visit | Attending: Orthopaedic Surgery | Admitting: Orthopaedic Surgery

## 2013-02-01 ENCOUNTER — Other Ambulatory Visit (HOSPITAL_COMMUNITY): Payer: Self-pay | Admitting: Orthopaedic Surgery

## 2013-02-01 DIAGNOSIS — Z4789 Encounter for other orthopedic aftercare: Secondary | ICD-10-CM | POA: Insufficient documentation

## 2013-02-01 DIAGNOSIS — T148XXA Other injury of unspecified body region, initial encounter: Secondary | ICD-10-CM

## 2013-10-20 ENCOUNTER — Emergency Department (HOSPITAL_COMMUNITY): Payer: Medicaid Other

## 2013-10-20 ENCOUNTER — Emergency Department (HOSPITAL_COMMUNITY)
Admission: EM | Admit: 2013-10-20 | Discharge: 2013-10-20 | Disposition: A | Discharge status: Home / Self Care | Payer: Medicaid Other | Attending: Emergency Medicine | Admitting: Emergency Medicine

## 2013-10-20 ENCOUNTER — Encounter (HOSPITAL_COMMUNITY): Payer: Self-pay | Admitting: Emergency Medicine

## 2013-10-20 ENCOUNTER — Other Ambulatory Visit: Payer: Self-pay

## 2013-10-20 DIAGNOSIS — Z79899 Other long term (current) drug therapy: Secondary | ICD-10-CM | POA: Insufficient documentation

## 2013-10-20 DIAGNOSIS — H543 Unqualified visual loss, both eyes: Secondary | ICD-10-CM | POA: Insufficient documentation

## 2013-10-20 DIAGNOSIS — H109 Unspecified conjunctivitis: Secondary | ICD-10-CM | POA: Insufficient documentation

## 2013-10-20 DIAGNOSIS — J4489 Other specified chronic obstructive pulmonary disease: Secondary | ICD-10-CM | POA: Insufficient documentation

## 2013-10-20 DIAGNOSIS — I10 Essential (primary) hypertension: Secondary | ICD-10-CM | POA: Diagnosis not present

## 2013-10-20 DIAGNOSIS — M129 Arthropathy, unspecified: Secondary | ICD-10-CM | POA: Diagnosis not present

## 2013-10-20 DIAGNOSIS — R0989 Other specified symptoms and signs involving the circulatory and respiratory systems: Secondary | ICD-10-CM | POA: Insufficient documentation

## 2013-10-20 DIAGNOSIS — J9610 Chronic respiratory failure, unspecified whether with hypoxia or hypercapnia: Secondary | ICD-10-CM | POA: Diagnosis not present

## 2013-10-20 DIAGNOSIS — Z862 Personal history of diseases of the blood and blood-forming organs and certain disorders involving the immune mechanism: Secondary | ICD-10-CM | POA: Diagnosis not present

## 2013-10-20 DIAGNOSIS — J449 Chronic obstructive pulmonary disease, unspecified: Secondary | ICD-10-CM | POA: Diagnosis not present

## 2013-10-20 DIAGNOSIS — R220 Localized swelling, mass and lump, head: Secondary | ICD-10-CM | POA: Diagnosis present

## 2013-10-20 DIAGNOSIS — M79609 Pain in unspecified limb: Secondary | ICD-10-CM | POA: Diagnosis not present

## 2013-10-20 DIAGNOSIS — R221 Localized swelling, mass and lump, neck: Secondary | ICD-10-CM | POA: Diagnosis present

## 2013-10-20 DIAGNOSIS — F17200 Nicotine dependence, unspecified, uncomplicated: Secondary | ICD-10-CM | POA: Diagnosis not present

## 2013-10-20 DIAGNOSIS — Z7982 Long term (current) use of aspirin: Secondary | ICD-10-CM | POA: Diagnosis not present

## 2013-10-20 DIAGNOSIS — Z8659 Personal history of other mental and behavioral disorders: Secondary | ICD-10-CM | POA: Diagnosis not present

## 2013-10-20 DIAGNOSIS — Z8639 Personal history of other endocrine, nutritional and metabolic disease: Secondary | ICD-10-CM | POA: Insufficient documentation

## 2013-10-20 HISTORY — DX: Unspecified glaucoma: H40.9

## 2013-10-20 HISTORY — DX: Unspecified visual loss: H54.7

## 2013-10-20 LAB — CBC WITH DIFFERENTIAL/PLATELET
Basophils Absolute: 0 10*3/uL (ref 0.0–0.1)
Basophils Relative: 1 % (ref 0–1)
EOS ABS: 0.2 10*3/uL (ref 0.0–0.7)
Eosinophils Relative: 3 % (ref 0–5)
HEMATOCRIT: 45.8 % (ref 36.0–46.0)
HEMOGLOBIN: 16 g/dL — AB (ref 12.0–15.0)
LYMPHS ABS: 1.7 10*3/uL (ref 0.7–4.0)
Lymphocytes Relative: 24 % (ref 12–46)
MCH: 34.3 pg — AB (ref 26.0–34.0)
MCHC: 34.9 g/dL (ref 30.0–36.0)
MCV: 98.1 fL (ref 78.0–100.0)
MONO ABS: 0.3 10*3/uL (ref 0.1–1.0)
MONOS PCT: 4 % (ref 3–12)
NEUTROS ABS: 4.8 10*3/uL (ref 1.7–7.7)
NEUTROS PCT: 68 % (ref 43–77)
Platelets: 173 10*3/uL (ref 150–400)
RBC: 4.67 MIL/uL (ref 3.87–5.11)
RDW: 14.2 % (ref 11.5–15.5)
WBC: 7 10*3/uL (ref 4.0–10.5)

## 2013-10-20 LAB — COMPREHENSIVE METABOLIC PANEL
ALK PHOS: 104 U/L (ref 39–117)
ALT: 28 U/L (ref 0–35)
AST: 28 U/L (ref 0–37)
Albumin: 3.8 g/dL (ref 3.5–5.2)
BILIRUBIN TOTAL: 0.4 mg/dL (ref 0.3–1.2)
BUN: 8 mg/dL (ref 6–23)
CHLORIDE: 95 meq/L — AB (ref 96–112)
CO2: 31 mEq/L (ref 19–32)
Calcium: 9.1 mg/dL (ref 8.4–10.5)
Creatinine, Ser: 0.62 mg/dL (ref 0.50–1.10)
GFR calc non Af Amer: >90 mL/min (ref >90)
GLUCOSE: 127 mg/dL — AB (ref 70–99)
POTASSIUM: 3.7 meq/L (ref 3.7–5.3)
Sodium: 137 mEq/L (ref 137–147)
Total Protein: 8.2 g/dL (ref 6.0–8.3)

## 2013-10-20 LAB — URINALYSIS, ROUTINE W REFLEX MICROSCOPIC
BILIRUBIN URINE: NEGATIVE
Glucose, UA: NEGATIVE mg/dL
HGB URINE DIPSTICK: NEGATIVE
KETONES UR: NEGATIVE mg/dL
Leukocytes, UA: NEGATIVE
Nitrite: NEGATIVE
PROTEIN: NEGATIVE mg/dL
Specific Gravity, Urine: >1.03 — ABNORMAL HIGH (ref 1.005–1.030)
UROBILINOGEN UA: 0.2 mg/dL (ref 0.0–1.0)
pH: 6 (ref 5.0–8.0)

## 2013-10-20 LAB — TROPONIN I

## 2013-10-20 LAB — PRO B NATRIURETIC PEPTIDE: Pro B Natriuretic peptide (BNP): 89.4 pg/mL (ref 0–125)

## 2013-10-20 MED ORDER — ALBUTEROL SULFATE HFA 108 (90 BASE) MCG/ACT IN AERS
2.0000 | INHALATION_SPRAY | RESPIRATORY_TRACT | Status: DC | PRN
  Administered 2013-10-20: 2 via RESPIRATORY_TRACT
  Filled 2013-10-20: qty 6.7

## 2013-10-20 MED ORDER — HYDROCHLOROTHIAZIDE 25 MG PO TABS
25.0000 mg | ORAL_TABLET | Freq: Every day | ORAL | Status: DC
  Administered 2013-10-20: 25 mg via ORAL
  Filled 2013-10-20 (×3): qty 1

## 2013-10-20 MED ORDER — LEVOTHYROXINE SODIUM 200 MCG PO TABS: 200.0000 ug | ORAL_TABLET | Freq: Every day | ORAL | Status: DC

## 2013-10-20 MED ORDER — LOSARTAN POTASSIUM 100 MG PO TABS: 100.0000 mg | ORAL_TABLET | Freq: Every day | ORAL | Status: DC

## 2013-10-20 MED ORDER — LOSARTAN POTASSIUM 50 MG PO TABS
100.0000 mg | ORAL_TABLET | Freq: Once | ORAL | Status: AC
  Administered 2013-10-20: 100 mg via ORAL
  Filled 2013-10-20: qty 2

## 2013-10-20 MED ORDER — SULFACETAMIDE SODIUM 10 % OP SOLN
2.0000 [drp] | OPHTHALMIC | Status: DC
  Administered 2013-10-20: 2 [drp] via OPHTHALMIC
  Filled 2013-10-20: qty 15

## 2013-10-20 MED ORDER — HYDROCHLOROTHIAZIDE 25 MG PO TABS: 25.0000 mg | ORAL_TABLET | Freq: Every day | ORAL | Status: DC

## 2013-10-20 NOTE — ED Notes (Signed)
Pt states she woke up this morning with swelling to left eye. Pt reports "extremely blurry vision" in her left eye since symptoms began. 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 Pt has hx of glaucoma. Pt also reports swelling in both feet "for months".

## 2013-10-20 NOTE — Discharge Instructions (Signed)
Please call [place] with Case Management [phone]  tomorrow morning for medication assistance. Conjunctivitis Conjunctivitis is commonly called "pink eye." Conjunctivitis can be caused by bacterial or viral infection, allergies, or injuries. There is usually redness of the lining of the eye, itching, discomfort, and sometimes discharge. There may be deposits of matter along the eyelids. A viral infection usually causes a watery discharge, while a bacterial infection causes a yellowish, thick discharge. Pink eye is very contagious and spreads by direct contact. You may be given antibiotic eyedrops as part of your treatment. Before using your eye medicine, remove all drainage from the eye by washing gently with warm water and cotton balls. Continue to use the medication until you have awakened 2 mornings in a row without discharge from the eye. Do not rub your eye. This increases the irritation and helps spread infection. Use separate towels from other household members. Wash your hands with soap and water before and after touching your eyes. Use cold compresses to reduce pain and sunglasses to relieve irritation from light. Do not wear contact lenses or wear eye makeup until the infection is gone. SEEK MEDICAL CARE IF:   Your symptoms are not better after 3 days of treatment.  You have increased pain or trouble seeing.  The outer eyelids become very red or swollen. Document Released: 07/24/2004 Document Revised: 09/08/2011 Document Reviewed: 06/16/2005 [facility] Patient Information 2014 [employer], [place].  Blepharitis Blepharitis is redness, soreness, and swelling (inflammation) of one or both eyelids. It may be caused by an allergic reaction or a bacterial infection. Blepharitis may also be associated with reddened, scaly skin (seborrhea) of the scalp and eyebrows. While you sleep, eye discharge may cause your eyelashes to stick together. Your eyelids may itch, burn, swell, and may lose their lashes.  These will grow back. Your eyes may become sensitive. Blepharitis may recur and need repeated treatment. If this is the case, you may require further evaluation by an eye specialist (ophthalmologist). HOME CARE INSTRUCTIONS   Keep your hands clean.  Use a clean towel each time you dry your eyelids. Do not use this towel to clean other areas. Do not share a towel or makeup with anyone.  Wash your eyelids with warm water or warm water mixed with a small amount of baby shampoo. Do this twice a day or as often as needed.  Wash your face and eyebrows at least once a day.  Use warm compresses 2 times a day for 10 minutes at a time, or as directed by your caregiver.  Apply antibiotic ointment as directed by your caregiver.  Avoid rubbing your eyes.  Avoid wearing makeup until you get better.  Follow up with your caregiver as directed. SEEK IMMEDIATE MEDICAL CARE IF:   You have pain, redness, or swelling that gets worse or spreads to other parts of your face.  Your vision changes, or you have pain when looking at lights or moving objects.  You have a fever.  Your symptoms continue for longer than 2 to 4 days or become worse. MAKE SURE YOU:   Understand these instructions.  Will watch your condition.  Will get help right away if you are not doing well or get worse. Document Released: 06/13/2000 Document Revised: 09/08/2011 Document Reviewed: 07/24/2010 [facility] Patient Information 2014 [employer], [place].

## 2013-10-20 NOTE — ED Notes (Signed)
Has been unable to take any of her medications for last 2 months.

## 2013-10-20 NOTE — ED Notes (Addendum)
Bilateral feet swelling for 3 days.  Woke up this morning with left eye swollen.  States she can not see out of left eye.

## 2013-10-20 NOTE — ED Provider Notes (Signed)
CSN: [id number]     Arrival date & time 10/20/13  1158 History  This chart was scribed for American Expressathan R. Rubin PayorPickering, MD by Quintella ReichertMatthew Underwood, ED scribe.  This patient was seen in room APA03/APA03 and the patient's care was started at 1:11 PM.   Chief Complaint  Patient presents with  . Facial Swelling    The history is provided by the patient. No language interpreter was used.    HPI Comments: [name] is a 48 y.o. female who presents to the Emergency Department complaining of left eyelid swelling that began this morning with associated blurred vision.  Pt states that she noticed the eyelid was very swollen after she woke up this morning.  She also reports her vision is blurred in that eye even when she lifts the eyelid up.  She denies injury to the area and states it is not painful.  She denies prior h/o similar symptoms.  She denies recent contact with similar symptoms.  She denies new medications.  She reports a h/o seasonal allergies with sinus congestion but denies having issues with sinus congestion or rhinorrhea recently.    Pt also reports her feet have been swollen for several months.  She has been off of her medications for 2-3 months.  She denies nausea, vomiting, diarrhea, CP, SOB, or cough,    Past Medical History  Diagnosis Date  . Thyroid disease   . Hypertension   . Hyperlipidemia   . COPD (chronic obstructive pulmonary disease)   . Asthma   . Shortness of breath   . Sleep apnea   . Arthritis   . Depression   . Chronic respiratory failure   . Glaucoma   . Blind     Past Surgical History  Procedure Laterality Date  . Tubal ligation    . Ankle surgery    . Orif ankle fracture Left 12/01/2012    Procedure: OPEN REDUCTION INTERNAL FIXATION (ORIF) ANKLE FRACTURE;  Surgeon: Darreld McleanWayne Keeling, MD;  Location: [facility];  Service: Orthopedics;  Laterality: Left;    Family History  Problem Relation Age of Onset  . Cancer Mother   . Heart failure Father     History   Substance Use Topics  . Smoking status: Current Every Day Smoker -- 1.00 packs/day for 30 years    Types: Cigarettes  . Smokeless tobacco: Current User  . Alcohol Use: No    OB History   Grav Para Term Preterm Abortions TAB SAB Ect Mult Living   2 2 2       2        Review of Systems  HENT: Negative for congestion, rhinorrhea and sinus pressure.   Eyes: Positive for visual disturbance. Negative for pain.       Eyelid swelling  Respiratory: Negative for cough and shortness of breath.   Cardiovascular: Positive for leg swelling. Negative for chest pain.  Gastrointestinal: Negative for nausea, vomiting and diarrhea.      Allergies  Review of patient's allergies indicates no known allergies.  Home Medications   Prior to Admission medications   Medication Sig Start Date End Date Taking? Authorizing Provider  albuterol (PROVENTIL HFA;VENTOLIN HFA) 108 (90 BASE) MCG/ACT inhaler Inhale 2 puffs into the lungs every 6 (six) hours as needed for wheezing. 12/03/12  Yes Vickki HearingStanley E Harrison, MD  aspirin EC 81 MG tablet Take 81 mg by mouth daily.   Yes Historical Provider, MD   BP 152/86  Pulse 72  Temp(Src) 98.4 F (36.9  C) (Oral)  Resp 18  Ht 5\' 5"  (1.651 m)  Wt 252 lb (114.306 kg)  BMI 41.93 kg/m2  SpO2 93%  Physical Exam  Nursing note and vitals reviewed. Constitutional: She is oriented to person, place, and time. She appears well-developed and well-nourished. No distress.  HENT:  Head: Normocephalic and atraumatic.  No sinus tenderness.  Eyes: EOM are normal.  Edema of upper and lower lids of left eye.  No erythema or induration.  Subconjunctival edema.  Small amount of purulent drainage from left eye.  EOMs intact.   Neck: Neck supple. No tracheal deviation present.  Cardiovascular: Normal rate.   Pulmonary/Chest: Effort normal. No respiratory distress. She has rales.  Few scattered rales throughout  Abdominal: There is no tenderness.  Obese  Musculoskeletal: Normal  range of motion. She exhibits edema.  Bilateral lower extremity pitting edema with chronic venous changes  Neurological: She is alert and oriented to person, place, and time.  Skin: Skin is warm and dry.  Psychiatric: She has a normal mood and affect. Her behavior is normal.    ED Course  Procedures (including critical care time)     COORDINATION OF CARE: 1:17 PM-Discussed treatment plan which includes EKG, CXR and labs with pt at bedside and pt agreed to plan.     Labs Review Labs Reviewed  CBC WITH DIFFERENTIAL - Abnormal; Notable for the following:    Hemoglobin 16.0 (*)    MCH 34.3 (*)    All other components within normal limits  COMPREHENSIVE METABOLIC PANEL - Abnormal; Notable for the following:    Chloride 95 (*)    Glucose, Bld 127 (*)    All other components within normal limits  URINALYSIS, ROUTINE W REFLEX MICROSCOPIC - Abnormal; Notable for the following:    Specific Gravity, Urine >1.030 (*)    All other components within normal limits  TROPONIN I  PRO B NATRIURETIC PEPTIDE    Imaging Review Dg Chest 2 View  10/20/2013   CLINICAL DATA:  Bilateral feet swelling for 3 days, left eye swelling, history hypertension, COPD, asthma, thyroid disease  EXAM: CHEST  2 VIEW  COMPARISON:  11/05/2012  FINDINGS: Enlargement of cardiac silhouette.  Mediastinal contours and pulmonary vascularity normal.  Minimal bibasilar atelectasis.  Vague opacity mid right lung question approximate 2.6 cm diameter.  Additional nodular focus left mid lung approximately 1.7 cm diameter.  Pulmonary nodules not excluded.  No pleural effusion or pneumothorax.  Bones demineralized.  IMPRESSION: Enlargement of cardiac silhouette with bibasilar atelectasis.  Question bilateral pulmonary nodules; CT chest recommended to exclude pulmonary nodules.   Electronically Signed   By: Ulyses SouthwardMark  Boles M.D.   On: 10/20/2013 14:45   Ct Chest Wo Contrast  10/20/2013   CLINICAL DATA:  Abnormal chest radiograph, shortness  of breath  EXAM: CT CHEST WITHOUT CONTRAST  TECHNIQUE: Multidetector CT imaging of the chest was performed following the standard protocol without IV contrast.  COMPARISON:  DG CHEST 2 VIEW dated 10/20/2013; DG CHEST 1V PORT dated 11/05/2012; CT ANGIO CHEST W/CM &/OR WO/CM dated 11/03/2012  FINDINGS: The thoracic inlet is unremarkable.  No mediastinal nor hilar adenopathy nor masses. Atherosclerotic calcifications are appreciated within the aorta.  Severe centrilobular evidence emphysematous changes within the upper lobes. Milder areas are appreciated within the lower lobes. Linear areas of scarring versus atelectasis within the peripheral base the right left lower lobes. Stable pericardial lipomatosis appreciated.  The central airways are patent. There is no evidence of a thoracic aortic aneurysm.  No pulmonary masses, nodules, infiltrates nor regions of consolidation.  A stable cystic mass with rim calcifications of the right renal fossa. Remaining visualized upper abdominal viscera are unremarkable.  Stable healed posterior rib fractures. No aggressive appearing osseous lesions.  IMPRESSION: Advanced emphysematous changes within the upper lobes without focal abnormalities. Milder changes within the bases.  Minimal areas of scarring versus atelectasis within the lung bases  Healed posterior rib fractures  Stable cystic mass in the right adrenal fossa with rim calcifications differential consideration, the sequela of prior adrenal hemorrhage.   Electronically Signed   By: Salome Holmes M.D.   On: 10/20/2013 16:02     EKG Interpretation None      MDM   Final diagnoses:  Conjunctivitis    Patient was sent in for eyelid swelling and some hypoxia. She is not hypoxic here. She's been off her medications for months, she was restarted on them here. Chest x-ray showed some emphysema with some nodules. CT scan did not show a nodules. There is swelling around her left eye. It appears to be more edematous than  infection. She does have likely conjunctivitis on the left side we'll treat with drops. White count is not elevated. Patient was instructed about the possibility of this turning into more of an infection. She will followup. She has followup with the community health clinics.    I personally performed the services described in this documentation, which was scribed in my presence. The recorded information has been reviewed and is accurate.     Juliet Rude. Rubin Payor, MD 10/20/13 [phone]

## 2013-10-20 NOTE — ED Notes (Signed)
Pt left ED before staff could check vital signs. Pt was asked to wait in room so staff could get VS machine. Pt was not in room when staff returned.

## 2013-10-21 NOTE — Progress Notes (Signed)
CM called by Lubertha BasqueBetty Ratliff, financial counselor, stating that pt called and needed medication assistance with ED medications. MATCH voucher given and explained how voucher works. Pt verbalized understanding.

## 2014-04-22 IMAGING — CR DG CHEST 1V PORT
1 series · 1 of 1 positions shown · non-contrast
Comparison: 11/02/2012 and earlier.

CLINICAL DATA: 46-year-old female hypoxemia.  Ankle injury.

PORTABLE CHEST - 1 VIEW

[view not recorded]
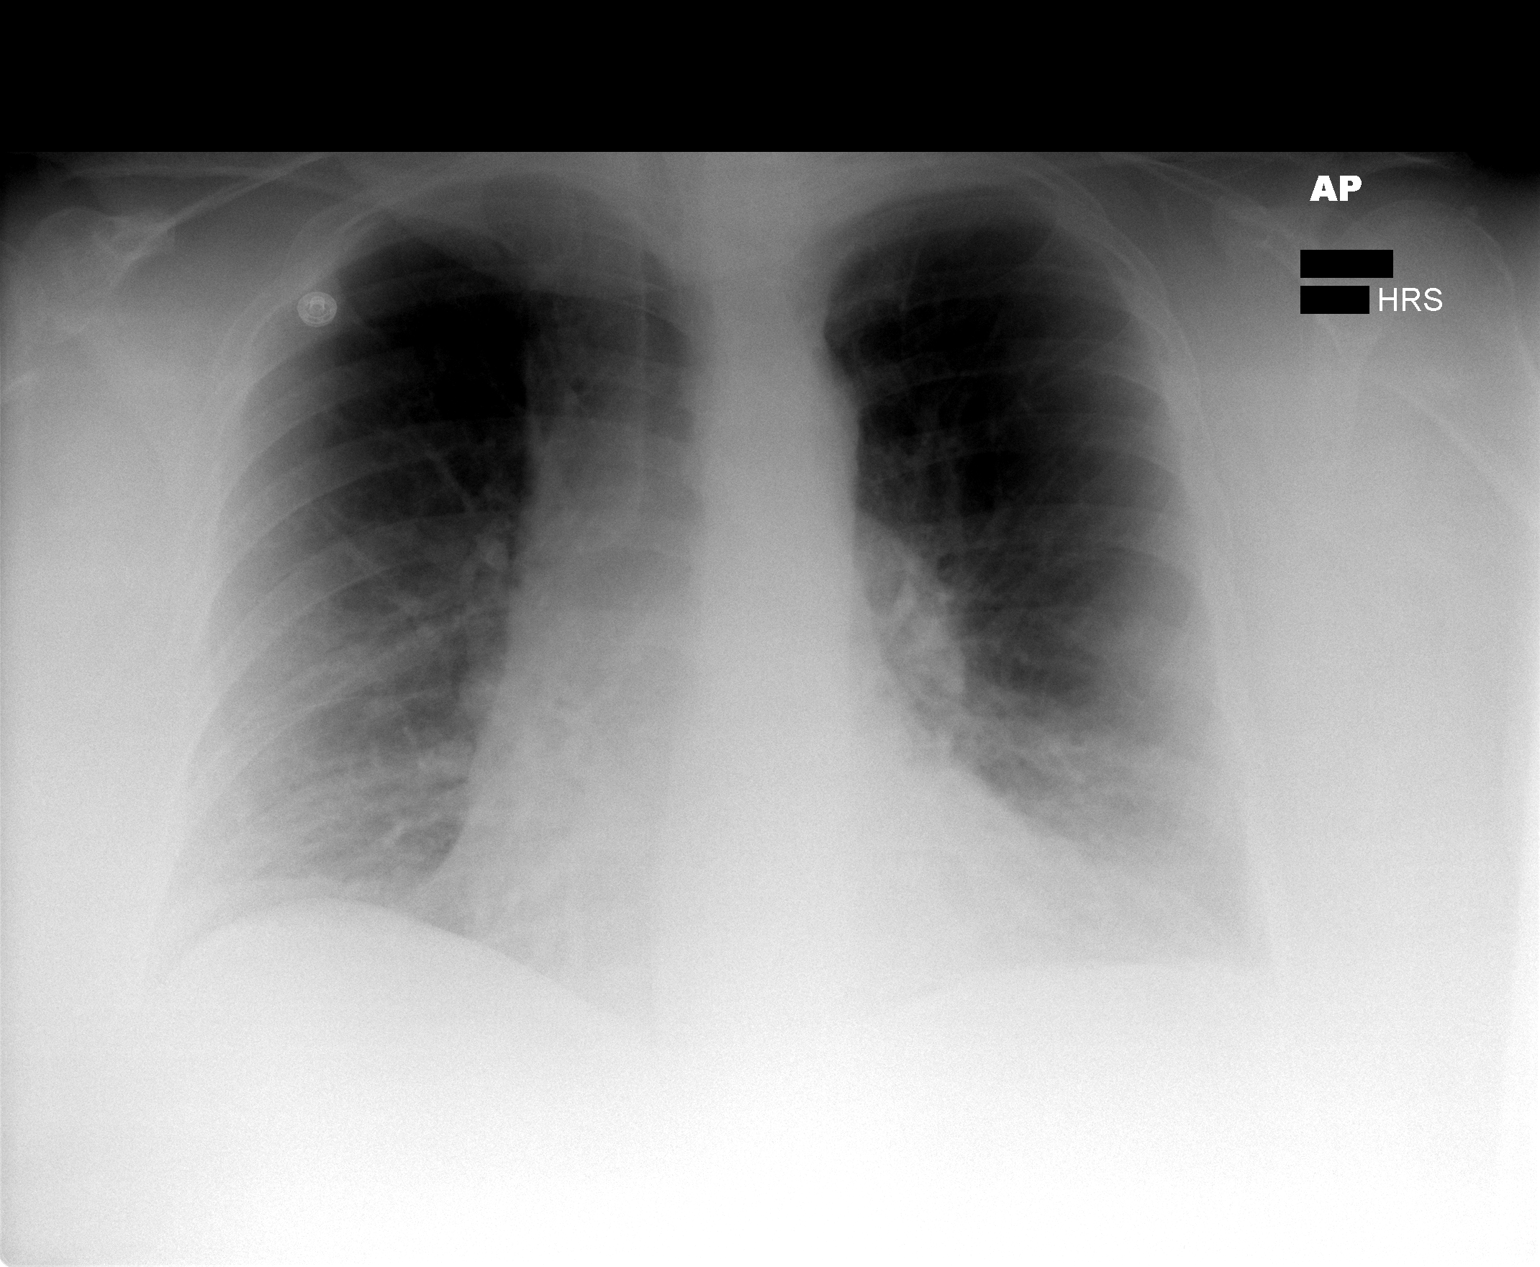

[1 of 1 positions shown; findings below may reference images not displayed]

FINDINGS: Portable upright AP view 9969 hours.  Stable lung
volumes.  Stable perihilar and basilar linear opacity.  Interval
decrease vascular congestion.  No pneumothorax, pleural effusion or
acute pulmonary opacity identified.  Stable cardiac size and
mediastinal contours.  Visualized tracheal air column is within
normal limits.
IMPRESSION: Interval resolved vascular congestion.  Mild bibasilar streaky
opacity felt related to chronic lung disease. No superimposed acute
findings are identified.

## 2014-04-24 IMAGING — CR DG CHEST 1V PORT
1 series · 1 of 1 positions shown · non-contrast
Comparison: 11/03/2012 CT and chest x-ray.

CLINICAL DATA: Respiratory failure.

PORTABLE CHEST - 1 VIEW

[view not recorded]
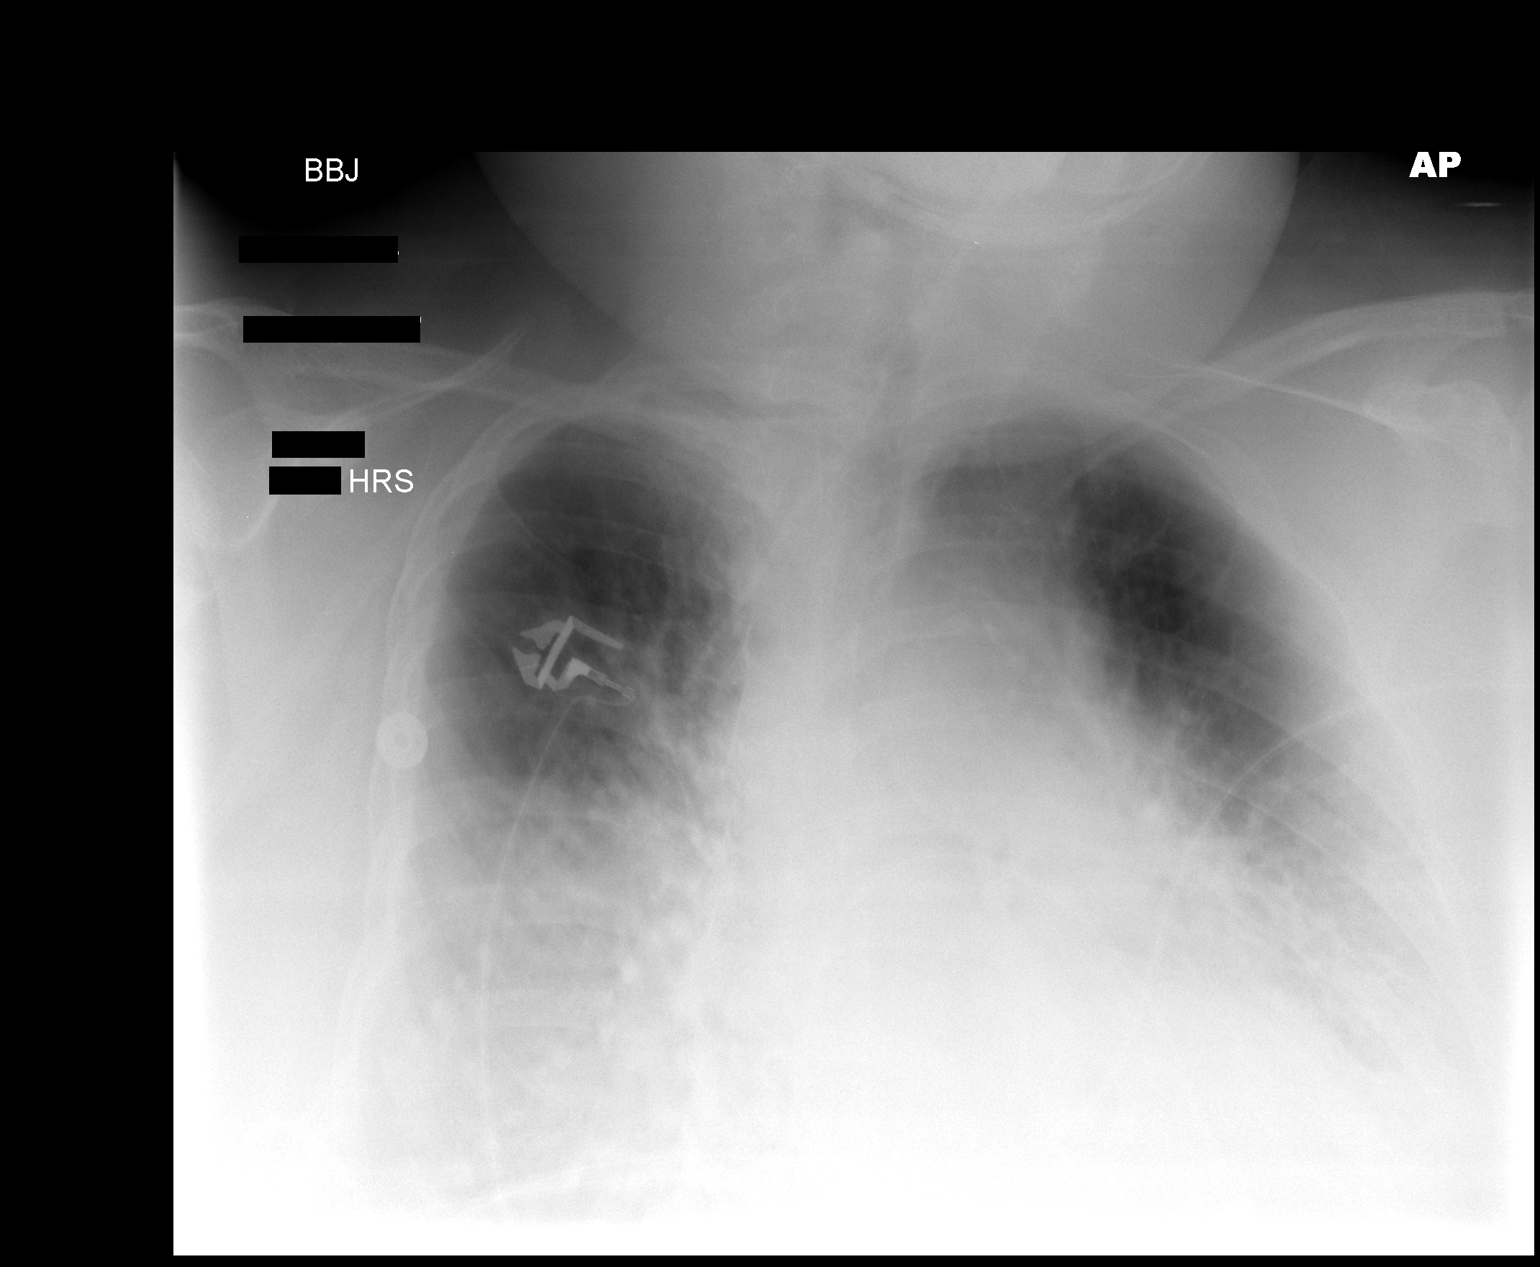

[1 of 1 positions shown; findings below may reference images not displayed]

FINDINGS: Appearance of interval development of consolidation mid
to lower lung zones bilaterally greater on the right.  Patient's
habitus and portable technique may partially contribute to this
appearance.

Underlying pulmonary vascular congestion/pulmonary edema.

Cardiomegaly.

Limited evaluation of the mediastinal structures.
IMPRESSION: Significant change when compared to the prior examination.  Please
see above discussion.

## 2014-05-01 ENCOUNTER — Encounter (HOSPITAL_COMMUNITY): Payer: Self-pay | Admitting: Emergency Medicine

## 2014-06-15 ENCOUNTER — Emergency Department (HOSPITAL_COMMUNITY)
Admission: EM | Admit: 2014-06-15 | Discharge: 2014-06-15 | Discharge status: Against Medical Advice | Payer: Medicaid Other | Attending: Emergency Medicine | Admitting: Emergency Medicine

## 2014-06-15 ENCOUNTER — Encounter (HOSPITAL_COMMUNITY): Payer: Self-pay | Admitting: Emergency Medicine

## 2014-06-15 DIAGNOSIS — H540 Blindness, both eyes: Secondary | ICD-10-CM | POA: Diagnosis not present

## 2014-06-15 DIAGNOSIS — I10 Essential (primary) hypertension: Secondary | ICD-10-CM | POA: Diagnosis not present

## 2014-06-15 DIAGNOSIS — Z720 Tobacco use: Secondary | ICD-10-CM | POA: Diagnosis not present

## 2014-06-15 DIAGNOSIS — J449 Chronic obstructive pulmonary disease, unspecified: Secondary | ICD-10-CM | POA: Diagnosis not present

## 2014-06-15 DIAGNOSIS — R739 Hyperglycemia, unspecified: Secondary | ICD-10-CM | POA: Insufficient documentation

## 2014-06-15 LAB — CBG MONITORING, ED: Glucose-Capillary: 437 mg/dL — ABNORMAL HIGH (ref 70–99)

## 2014-06-15 MED ORDER — SODIUM CHLORIDE 0.9 % IV BOLUS (SEPSIS): 1000.0000 mL | Freq: Once | INTRAVENOUS | Status: DC

## 2014-06-15 MED ORDER — SODIUM CHLORIDE 0.9 % IV SOLN: INTRAVENOUS | Status: DC

## 2014-06-15 NOTE — ED Notes (Signed)
Pt states she went to pcp and had blood work done. They called her and told her she was diabetic and needed to come back to the office. Pt did not follow up with pcp and wants to be seen here.

## 2014-06-15 NOTE — ED Notes (Addendum)
No return phone call from PMD. Pt has decided to go home and assures me that she will get to her PMD in the morning. Pt has been informed of risks of untreated high blood sugars, states her former husband was a diabetic and is aware of risks. Will limit her carbohydrate in take tonight and no added sugars of any kind. Will return here if feeling unwell. Pt signed AMA and apologized for taking our time.

## 2014-06-15 NOTE — ED Notes (Signed)
I was about to start pts IV when she received a voice message from Zoe LanPenny Jones PA [facility] stating that if the pt was feeling OK that she didn't need to go to the ED tonight and should come to the office in the am. Patient would prefer to do that. We placed a phone call to the office and are awaiting a call back.

## 2014-10-27 ENCOUNTER — Other Ambulatory Visit: Payer: Self-pay | Admitting: Nurse Practitioner

## 2014-10-27 DIAGNOSIS — Z1231 Encounter for screening mammogram for malignant neoplasm of breast: Secondary | ICD-10-CM

## 2014-11-09 ENCOUNTER — Ambulatory Visit: Payer: Medicaid Other

## 2014-11-21 ENCOUNTER — Ambulatory Visit: Payer: Medicaid Other

## 2014-11-30 ENCOUNTER — Ambulatory Visit (INDEPENDENT_AMBULATORY_CARE_PROVIDER_SITE_OTHER): Payer: Medicaid Other | Admitting: Podiatry

## 2014-11-30 VITALS — BP 111/63 | HR 62 | Temp 96.2°F | Resp 16

## 2014-11-30 DIAGNOSIS — M79672 Pain in left foot: Secondary | ICD-10-CM

## 2014-11-30 DIAGNOSIS — B351 Tinea unguium: Secondary | ICD-10-CM | POA: Diagnosis not present

## 2014-11-30 DIAGNOSIS — E1151 Type 2 diabetes mellitus with diabetic peripheral angiopathy without gangrene: Secondary | ICD-10-CM

## 2014-11-30 NOTE — Progress Notes (Signed)
   Subjective:    Patient ID: [name], female    DOB: 12-10-1965, 49 y.o.   MRN: [id number]  HPI  This patient presents to the office for her thick painful nails on both feet.  She says her nails are painful walking and wearing her shoes.  She says she was diagnosed as diabetic in November  She presents for preventive foot care services.    Review of Systems  Constitutional: Positive for activity change, appetite change and unexpected weight change.  Eyes: Positive for visual disturbance.  Respiratory: Positive for shortness of breath and wheezing.   Musculoskeletal: Positive for back pain and gait problem.       Objective:   Physical Exam  Dorsalis pedis and posterior tibial pulses are non palpable.  CR and TG WNL>  Marked ankle swelling both ankles due to previous fracture.  Limited motion and pain noted in rearfoot on DF and PF foot.  Thick disfigured discolored nail 1-5 B/L.       Assessment & Plan:  Onychomycosis  2. Type 2 Diabetes with angiopathy   Plan  Debridement and Grinding of Onychomycosis

## 2014-12-18 ENCOUNTER — Ambulatory Visit (HOSPITAL_COMMUNITY): Payer: Self-pay | Admitting: Psychiatry

## 2015-01-11 ENCOUNTER — Ambulatory Visit: Payer: Self-pay

## 2015-01-23 ENCOUNTER — Ambulatory Visit: Payer: Self-pay

## 2015-03-08 ENCOUNTER — Ambulatory Visit: Payer: Medicaid Other | Admitting: Podiatry

## 2015-04-08 IMAGING — CT CT CHEST W/O CM
2 of 3 series · 15 of 36 positions shown, 18 images · non-contrast
Comparison: DG CHEST 2 VIEW dated 10/20/2013;

CLINICAL DATA: Abnormal chest radiograph, shortness of breath

EXAM:
CT CHEST WITHOUT CONTRAST
TECHNIQUE: Multidetector CT imaging of the chest was performed following the
standard protocol without IV contrast.

[Series 2: chestroutine 5.0 b40f · axial · 0.65mm/px · z∈[-206,+50]mm · 12 of 61 slices shown, 15 images]
[im 5/61  mediastinal]
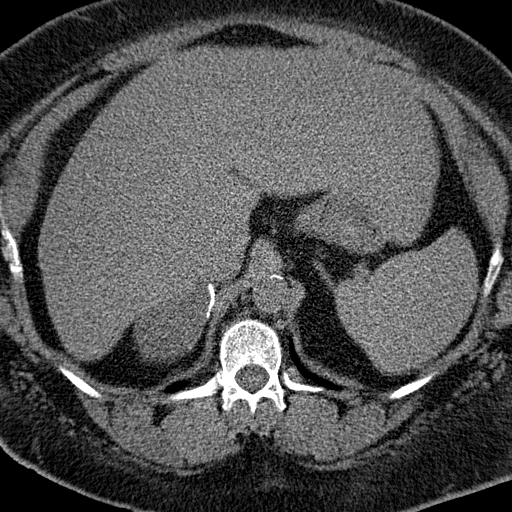
[im 5/61  lung]
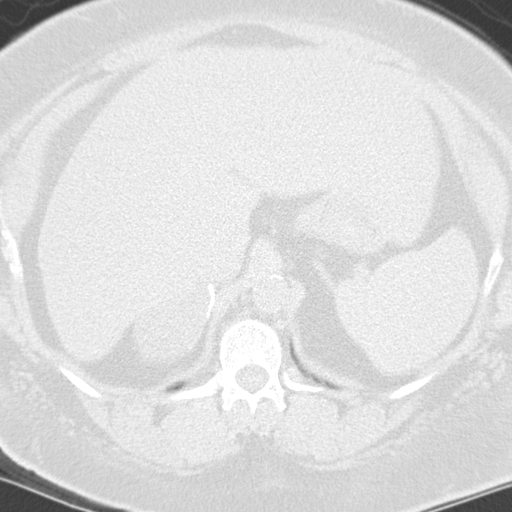
[im 9/61  lung]
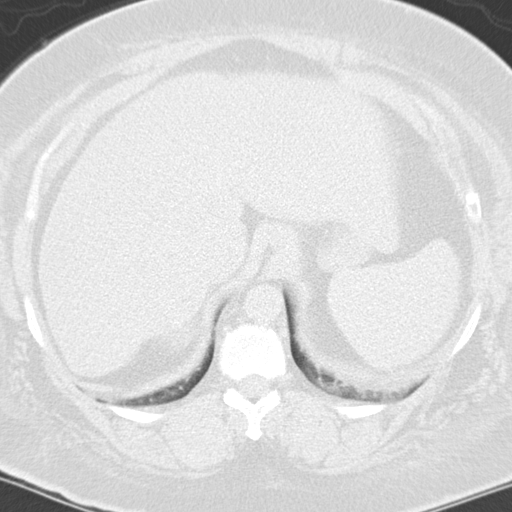
[im 14/61  lung]
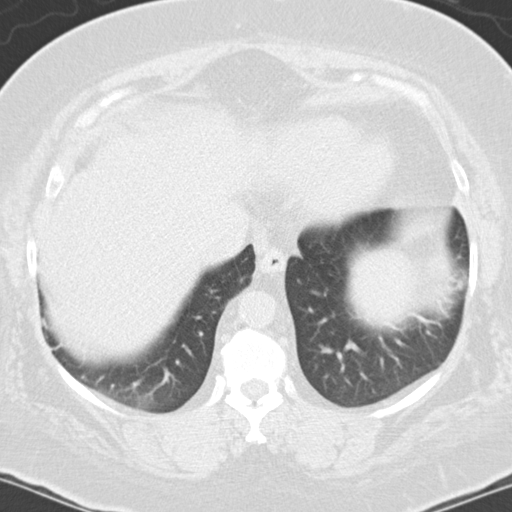
[im 18/61  lung]
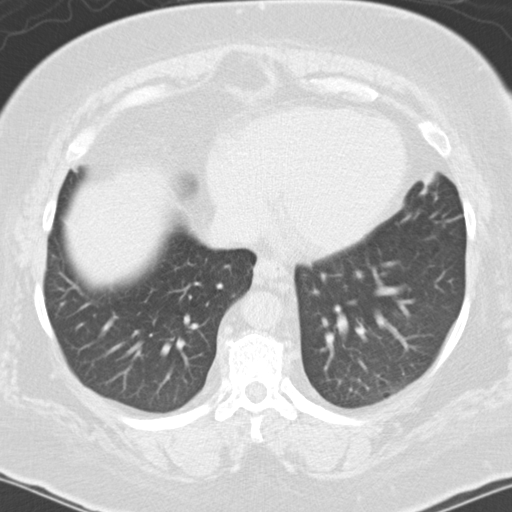
[im 23/61  mediastinal]
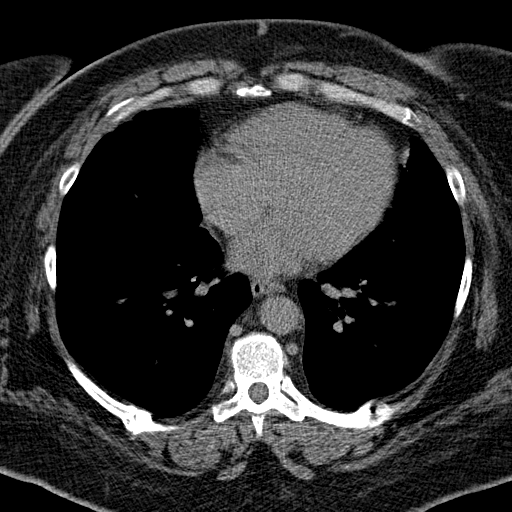
[im 23/61  lung]
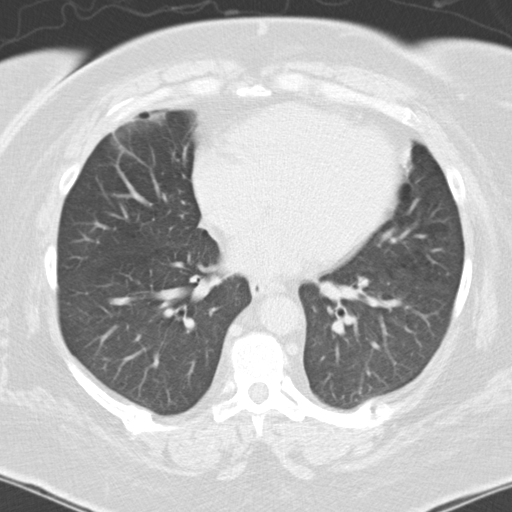
[im 27/61  lung]
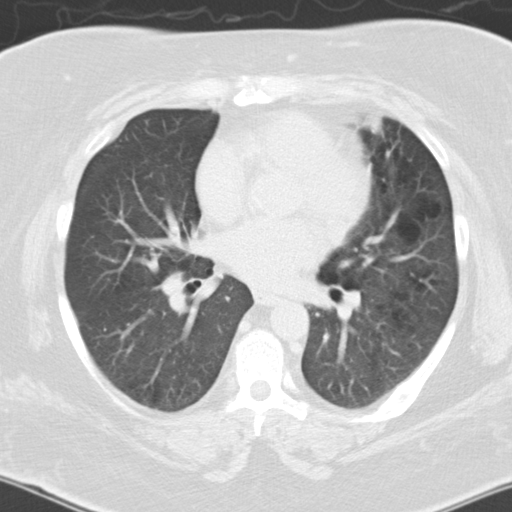
[im 34/61  lung]
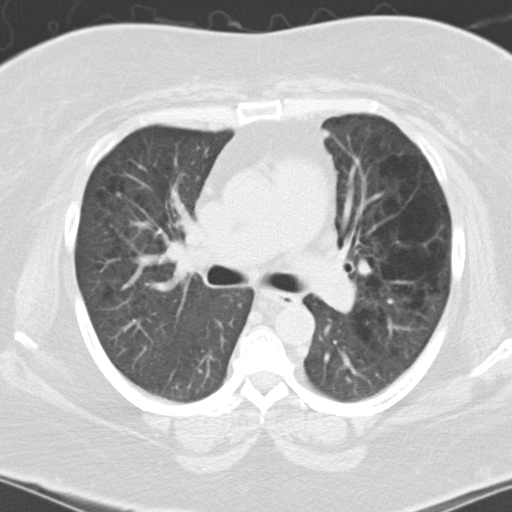
[im 38/61  lung]
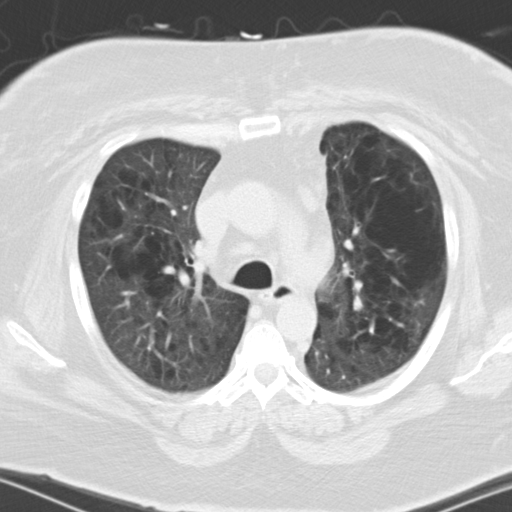
[im 43/61  mediastinal]
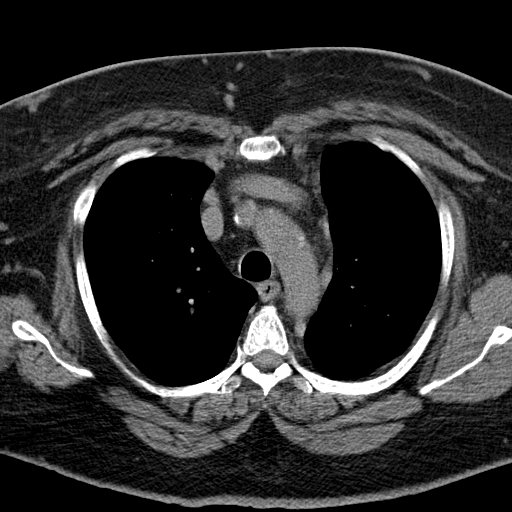
[im 43/61  lung]
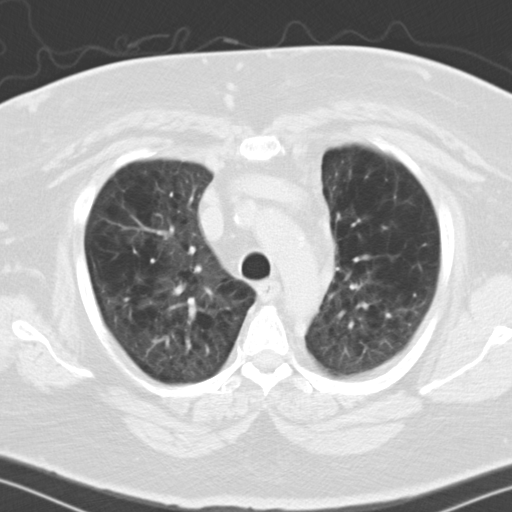
[im 47/61  lung]
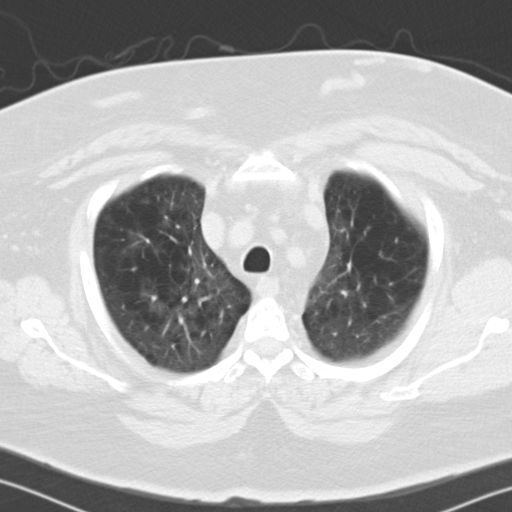
[im 52/61  lung]
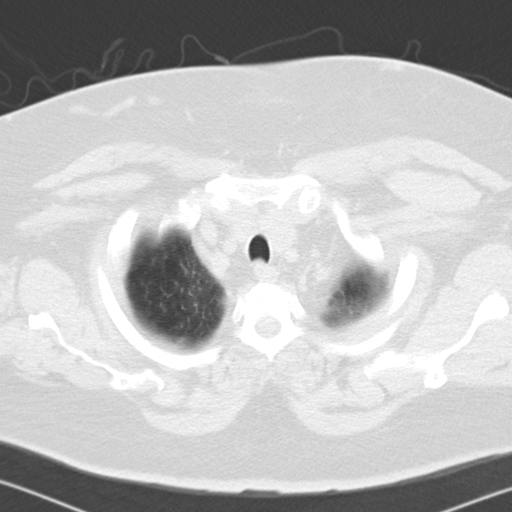
[im 56/61  lung]
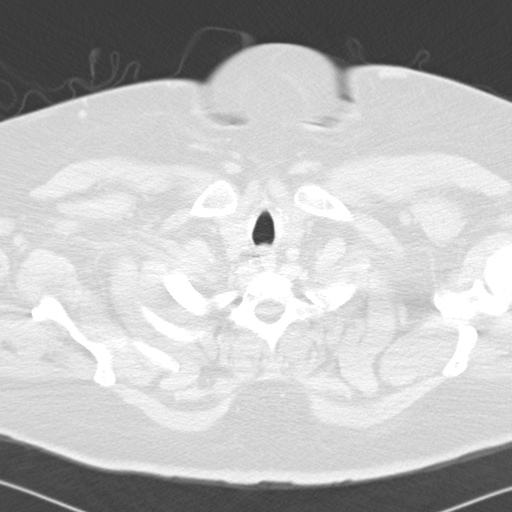

[Series 4: mpr coro 3mm · coronal · 0.59mm/px · 3 of 84 slices shown]
[im 17/84  lung]
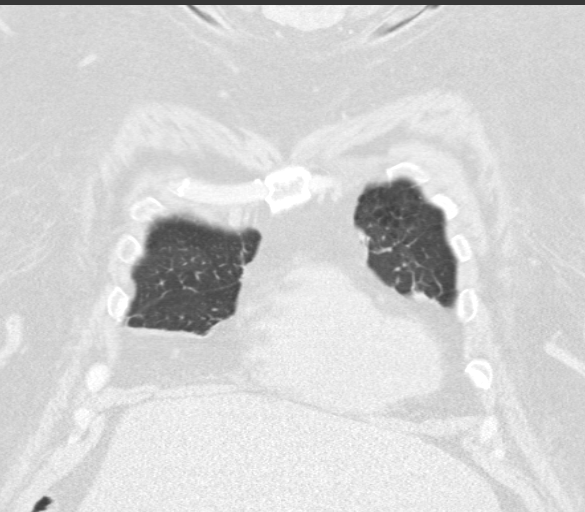
[im 34/84  lung]
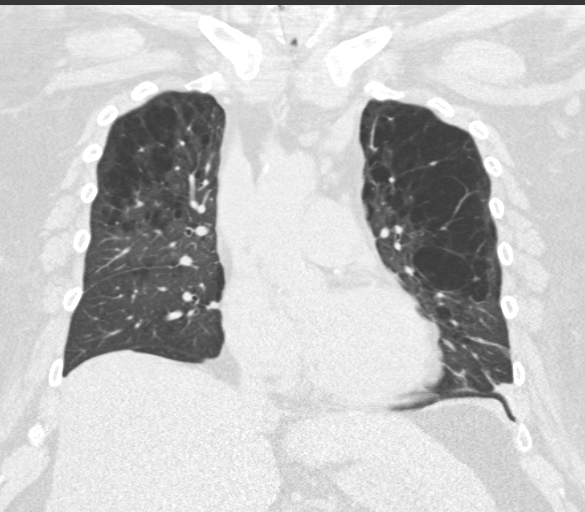
[im 50/84  lung]
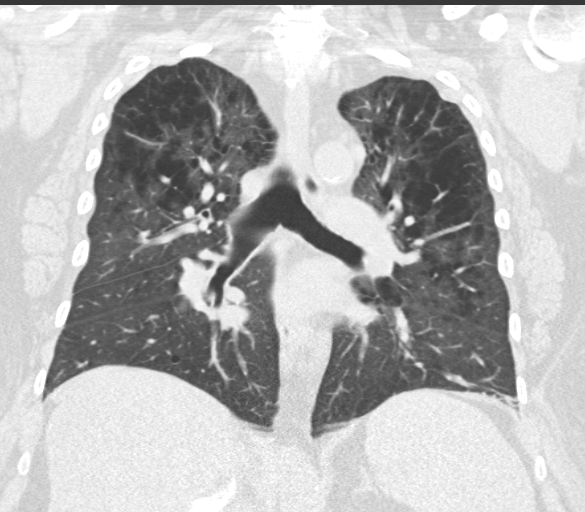

[15 of 36 positions shown; findings below may reference images not displayed]

DG CHEST 1V PORT dated
11/05/2012; CT ANGIO CHEST W/CM &/OR WO/CM dated 11/03/2012
FINDINGS: The thoracic inlet is unremarkable.

No mediastinal nor hilar adenopathy nor masses. Atherosclerotic
calcifications are appreciated within the aorta.

Severe centrilobular evidence emphysematous changes within the upper
lobes. Milder areas are appreciated within the lower lobes. Linear
areas of scarring versus atelectasis within the peripheral base the
right left lower lobes. Stable pericardial lipomatosis appreciated.

The central airways are patent. There is no evidence of a thoracic
aortic aneurysm.

No pulmonary masses, nodules, infiltrates nor regions of
consolidation.

A stable cystic mass with rim calcifications of the right renal
fossa. Remaining visualized upper abdominal viscera are
unremarkable.

Stable healed posterior rib fractures. No aggressive appearing
osseous lesions.
IMPRESSION: Advanced emphysematous changes within the upper lobes without focal
abnormalities. Milder changes within the bases.

Minimal areas of scarring versus atelectasis within the lung bases

Healed posterior rib fractures

Stable cystic mass in the right adrenal fossa with rim
calcifications differential consideration, the sequela of prior
adrenal hemorrhage.

## 2015-04-12 ENCOUNTER — Encounter: Payer: Self-pay | Admitting: Nurse Practitioner

## 2015-05-10 ENCOUNTER — Other Ambulatory Visit (HOSPITAL_COMMUNITY): Payer: Self-pay | Admitting: Nurse Practitioner

## 2015-05-10 DIAGNOSIS — Z1231 Encounter for screening mammogram for malignant neoplasm of breast: Secondary | ICD-10-CM

## 2015-05-30 ENCOUNTER — Ambulatory Visit (HOSPITAL_COMMUNITY): Payer: Self-pay

## 2015-06-06 ENCOUNTER — Ambulatory Visit (HOSPITAL_COMMUNITY): Payer: Self-pay

## 2015-07-01 ENCOUNTER — Encounter (HOSPITAL_COMMUNITY): Payer: Self-pay | Admitting: *Deleted

## 2015-07-01 ENCOUNTER — Emergency Department (HOSPITAL_COMMUNITY): Payer: Medicare Other

## 2015-07-01 ENCOUNTER — Emergency Department (HOSPITAL_COMMUNITY)
Admission: EM | Admit: 2015-07-01 | Discharge: 2015-07-01 | Disposition: A | Discharge status: Home / Self Care | Payer: Medicare Other | Attending: Emergency Medicine | Admitting: Emergency Medicine

## 2015-07-01 DIAGNOSIS — J441 Chronic obstructive pulmonary disease with (acute) exacerbation: Secondary | ICD-10-CM | POA: Diagnosis not present

## 2015-07-01 DIAGNOSIS — Z794 Long term (current) use of insulin: Secondary | ICD-10-CM | POA: Diagnosis not present

## 2015-07-01 DIAGNOSIS — E785 Hyperlipidemia, unspecified: Secondary | ICD-10-CM | POA: Diagnosis not present

## 2015-07-01 DIAGNOSIS — H6091 Unspecified otitis externa, right ear: Secondary | ICD-10-CM | POA: Insufficient documentation

## 2015-07-01 DIAGNOSIS — H540 Blindness, both eyes: Secondary | ICD-10-CM | POA: Diagnosis not present

## 2015-07-01 DIAGNOSIS — M1990 Unspecified osteoarthritis, unspecified site: Secondary | ICD-10-CM | POA: Diagnosis not present

## 2015-07-01 DIAGNOSIS — G4730 Sleep apnea, unspecified: Secondary | ICD-10-CM | POA: Insufficient documentation

## 2015-07-01 DIAGNOSIS — E669 Obesity, unspecified: Secondary | ICD-10-CM | POA: Diagnosis not present

## 2015-07-01 DIAGNOSIS — J40 Bronchitis, not specified as acute or chronic: Secondary | ICD-10-CM

## 2015-07-01 DIAGNOSIS — F329 Major depressive disorder, single episode, unspecified: Secondary | ICD-10-CM | POA: Insufficient documentation

## 2015-07-01 DIAGNOSIS — F17210 Nicotine dependence, cigarettes, uncomplicated: Secondary | ICD-10-CM | POA: Diagnosis not present

## 2015-07-01 DIAGNOSIS — E079 Disorder of thyroid, unspecified: Secondary | ICD-10-CM | POA: Diagnosis not present

## 2015-07-01 DIAGNOSIS — Z79899 Other long term (current) drug therapy: Secondary | ICD-10-CM | POA: Diagnosis not present

## 2015-07-01 DIAGNOSIS — Z7982 Long term (current) use of aspirin: Secondary | ICD-10-CM | POA: Diagnosis not present

## 2015-07-01 DIAGNOSIS — R05 Cough: Secondary | ICD-10-CM | POA: Diagnosis present

## 2015-07-01 DIAGNOSIS — Z7984 Long term (current) use of oral hypoglycemic drugs: Secondary | ICD-10-CM | POA: Insufficient documentation

## 2015-07-01 DIAGNOSIS — I10 Essential (primary) hypertension: Secondary | ICD-10-CM | POA: Diagnosis not present

## 2015-07-01 LAB — CBC WITH DIFFERENTIAL/PLATELET
Basophils Absolute: 0 10*3/uL (ref 0.0–0.1)
Basophils Relative: 0 %
EOS ABS: 0.1 10*3/uL (ref 0.0–0.7)
Eosinophils Relative: 2 %
HCT: 43.6 % (ref 36.0–46.0)
HEMOGLOBIN: 15.4 g/dL — AB (ref 12.0–15.0)
LYMPHS ABS: 1.6 10*3/uL (ref 0.7–4.0)
Lymphocytes Relative: 28 %
MCH: 33.4 pg (ref 26.0–34.0)
MCHC: 35.3 g/dL (ref 30.0–36.0)
MCV: 94.6 fL (ref 78.0–100.0)
MONOS PCT: 6 %
Monocytes Absolute: 0.3 10*3/uL (ref 0.1–1.0)
NEUTROS ABS: 3.6 10*3/uL (ref 1.7–7.7)
NEUTROS PCT: 64 %
Platelets: 145 10*3/uL — ABNORMAL LOW (ref 150–400)
RBC: 4.61 MIL/uL (ref 3.87–5.11)
RDW: 13 % (ref 11.5–15.5)
WBC: 5.7 10*3/uL (ref 4.0–10.5)

## 2015-07-01 LAB — COMPREHENSIVE METABOLIC PANEL
ALBUMIN: 3.5 g/dL (ref 3.5–5.0)
ALT: 30 U/L (ref 14–54)
AST: 24 U/L (ref 15–41)
Alkaline Phosphatase: 112 U/L (ref 38–126)
Anion gap: 9 (ref 5–15)
BUN: 12 mg/dL (ref 6–20)
CO2: 31 mmol/L (ref 22–32)
CREATININE: 0.58 mg/dL (ref 0.44–1.00)
Calcium: 8.9 mg/dL (ref 8.9–10.3)
Chloride: 96 mmol/L — ABNORMAL LOW (ref 101–111)
GFR calc non Af Amer: >60 mL/min (ref >60)
GLUCOSE: 271 mg/dL — AB (ref 65–99)
Potassium: 4.1 mmol/L (ref 3.5–5.1)
SODIUM: 136 mmol/L (ref 135–145)
TOTAL PROTEIN: 7.3 g/dL (ref 6.5–8.1)
Total Bilirubin: 0.4 mg/dL (ref 0.3–1.2)

## 2015-07-01 MED ORDER — ALBUTEROL SULFATE (2.5 MG/3ML) 0.083% IN NEBU
2.5000 mg | INHALATION_SOLUTION | Freq: Once | RESPIRATORY_TRACT | Status: AC
  Administered 2015-07-01: 2.5 mg via RESPIRATORY_TRACT
  Filled 2015-07-01: qty 3

## 2015-07-01 MED ORDER — AZITHROMYCIN 250 MG PO TABS: ORAL_TABLET | ORAL | Status: DC

## 2015-07-01 MED ORDER — AZITHROMYCIN 250 MG PO TABS
500.0000 mg | ORAL_TABLET | Freq: Once | ORAL | Status: AC
  Administered 2015-07-01: 500 mg via ORAL
  Filled 2015-07-01: qty 2

## 2015-07-01 MED ORDER — HYDROCOD POLST-CPM POLST ER 10-8 MG/5ML PO SUER
5.0000 mL | Freq: Once | ORAL | Status: AC
  Administered 2015-07-01: 5 mL via ORAL
  Filled 2015-07-01: qty 5

## 2015-07-01 MED ORDER — IPRATROPIUM-ALBUTEROL 0.5-2.5 (3) MG/3ML IN SOLN
3.0000 mL | Freq: Once | RESPIRATORY_TRACT | Status: AC
  Administered 2015-07-01: 3 mL via RESPIRATORY_TRACT
  Filled 2015-07-01: qty 3

## 2015-07-01 MED ORDER — PREDNISONE 20 MG PO TABS
20.0000 mg | ORAL_TABLET | Freq: Once | ORAL | Status: AC
  Administered 2015-07-01: 20 mg via ORAL
  Filled 2015-07-01: qty 1

## 2015-07-01 MED ORDER — HYDROCOD POLST-CPM POLST ER 10-8 MG/5ML PO SUER: 5.0000 mL | Freq: Two times a day (BID) | ORAL | Status: DC | PRN

## 2015-07-01 MED ORDER — NEOMYCIN-POLYMYXIN-HC 3.5-10000-1 OT SUSP: 2.0000 [drp] | Freq: Three times a day (TID) | OTIC | Status: DC

## 2015-07-01 MED ORDER — PREDNISONE 50 MG PO TABS: ORAL_TABLET | ORAL | Status: DC

## 2015-07-01 NOTE — Discharge Instructions (Signed)
Prescription for antibiotic, cough syrup, prednisone, eardrops. No smoking. Follow-up your Dr.

## 2015-07-01 NOTE — ED Notes (Signed)
Pt given graham crackers and PB to eat.

## 2015-07-01 NOTE — ED Notes (Signed)
Pt c/o productive cough, congestion, body aches x 3 days. Also c/o right ear pain. Vomited x 1 last night.

## 2015-07-01 NOTE — ED Provider Notes (Addendum)
CSN: [id number]     Arrival date & time 07/01/15  0940 History  By signing my name below, I, [facility], attest that this documentation has been prepared under the direction and in the presence of Donnetta Hutching, MD. Electronically Signed: Randell Patient, ED Scribe. 07/01/2015. 11:20 AM.   Chief Complaint  Patient presents with  . Cough   The history is provided by the patient. No language interpreter was used.   HPI Comments: [name] is a 50 y.o. female on home O2 at night with an hx of asthma, COPD, and HTN who presents to the Emergency Department complaining of moderate, intermittent, unchanging productive cough that began 6 days ago. Patient endorses associated nasal congestion, right ear pain, and sore throat. She has taken taken sinus medication, Dayquil, Nyquil, Tylenol, and ibuprofen without relief. Per patient, she notes that she has an inhaler at home and takes medication for DM but has not taken either today. She reports that she has taken prednisone in the past but that the medication causes her to feel ill. Patient states that she had a pneumonia vaccination 2 years ago and has not had an influenza vaccination this year. She denies wheezing. Patient is a current 0.5 ppd smoker.  Past Medical History  Diagnosis Date  . Hypertension   . Hyperlipidemia   . COPD (chronic obstructive pulmonary disease) (HCC)   . Asthma   . Shortness of breath   . Sleep apnea   . Arthritis   . Depression   . Chronic respiratory failure (HCC)   . Glaucoma   . Blind   . Thyroid disease    Past Surgical History  Procedure Laterality Date  . Tubal ligation    . Ankle surgery    . Orif ankle fracture Left 12/01/2012    Procedure: OPEN REDUCTION INTERNAL FIXATION (ORIF) ANKLE FRACTURE;  Surgeon: Darreld Mclean, MD;  Location: [facility];  Service: Orthopedics;  Laterality: Left;   Family History  Problem Relation Age of Onset  . Cancer Mother   . Heart failure Father    Social  History  Substance Use Topics  . Smoking status: Current Every Day Smoker -- 0.50 packs/day for 30 years    Types: Cigarettes  . Smokeless tobacco: Current User  . Alcohol Use: No   OB History    Gravida Para Term Preterm AB TAB SAB Ectopic Multiple Living   2 2 2       2      Review of Systems A complete 10 system review of systems was obtained and all systems are negative except as noted in the HPI and PMH.    Allergies  Review of patient's allergies indicates no known allergies.  Home Medications   Prior to Admission medications   Medication Sig Start Date End Date Taking? Authorizing Provider  albuterol (PROVENTIL HFA;VENTOLIN HFA) 108 (90 BASE) MCG/ACT inhaler Inhale 1-2 puffs into the lungs every 6 (six) hours as needed for wheezing or shortness of breath.    Yes Historical Provider, MD  ALPRAZolam Prudy Feeler) 0.5 MG tablet Take 0.5 mg by mouth 3 (three) times daily as needed for anxiety.   Yes Historical Provider, MD  aspirin EC 81 MG tablet Take 81 mg by mouth daily.   Yes Historical Provider, MD  atorvastatin (LIPITOR) 20 MG tablet Take 20 mg by mouth. 10/26/14 10/26/15 Yes Historical Provider, MD  hydrochlorothiazide (HYDRODIURIL) 25 MG tablet Take 1 tablet (25 mg total) by mouth daily. 10/20/13  Yes Benjiman Core,  MD  hydrOXYzine (VISTARIL) 25 MG capsule TAKE TWO CAPSULES BY MOUTH NIGHTLY AS NEEDED FOR ANXIETY OR SLEEP 11/08/14  Yes Historical Provider, MD  insulin glargine (LANTUS) 100 UNIT/ML injection Inject 28 Units into the skin at bedtime.   Yes Historical Provider, MD  Lancets (ACCU-CHEK MULTICLIX) lancets Test two times a day, once fasting, once 2 hours after meal 06/16/14  Yes Historical Provider, MD  levothyroxine (SYNTHROID, LEVOTHROID) 200 MCG tablet Take 1 tablet (200 mcg total) by mouth daily before breakfast. 10/20/13  Yes Benjiman CoreNathan Pickering, MD  metFORMIN (GLUCOPHAGE) 500 MG tablet Take 500 mg by mouth 2 (two) times daily with a meal. Take one tab with evening meal  for 1 week, then take 1 tablet twice a day with meal. 06/16/14  Yes Historical Provider, MD  olmesartan-hydrochlorothiazide (BENICAR HCT) 20-12.5 MG per tablet Take 1 tablet by mouth daily.    Yes Historical Provider, MD  azithromycin (ZITHROMAX Z-PAK) 250 MG tablet 1 tablet daily starting tomorrow at noon for 4 more days. 07/01/15   Donnetta HutchingBrian Mirabel Ahlgren, MD  Blood Glucose Monitoring Suppl (RA BLOOD GLUCOSE MONITOR) DEVI Test two times a day, once fasting, once 2 hours after meal 06/16/14   Historical Provider, MD  chlorpheniramine-HYDROcodone (TUSSIONEX PENNKINETIC ER) 10-8 MG/5ML SUER Take 5 mLs by mouth every 12 (twelve) hours as needed for cough. 07/01/15   Donnetta HutchingBrian Judaea Burgoon, MD  glucose blood test strip USE TO CHECK BLOOD SUGAR TWO TIMES DAILY, ONCE FASTING, ONCE 2 HOURS AFTER MEAL 09/08/14   Historical Provider, MD  Insulin Pen Needle 31G X 5 MM MISC Use with lantus pen once a day 06/16/14   Historical Provider, MD  neomycin-polymyxin-hydrocortisone (CORTISPORIN) 3.5-10000-1 otic suspension Place 2 drops into the right ear 3 (three) times daily. 07/01/15   Donnetta HutchingBrian Toshiko Kemler, MD  predniSONE (DELTASONE) 50 MG tablet One half tablet daily for 7 days 07/01/15   Donnetta HutchingBrian Avaline Stillson, MD   BP 144/121 mmHg  Pulse 78  Temp(Src) 98.2 F (36.8 C) (Oral)  Resp 22  Ht 5\' 5"  (1.651 m)  Wt 286 lb (129.729 kg)  BMI 47.59 kg/m2  SpO2 94%  LMP  Physical Exam  Constitutional: She is oriented to person, place, and time. She appears well-developed and well-nourished.  Obese but no respiratory distress.  HENT:  Head: Normocephalic and atraumatic.  Right external canal of right ear is red and inflamed.  Eyes: Conjunctivae and EOM are normal. Pupils are equal, round, and reactive to light.  Neck: Normal range of motion. Neck supple.  Cardiovascular: Normal rate and regular rhythm.   Pulmonary/Chest: Effort normal and breath sounds normal.  Abdominal: Soft. Bowel sounds are normal.  Musculoskeletal: Normal range of motion.  Neurological: She is  alert and oriented to person, place, and time.  Skin: Skin is warm and dry.  Psychiatric: She has a normal mood and affect. Her behavior is normal.  Nursing note and vitals reviewed.   ED Course  Procedures   DIAGNOSTIC STUDIES: Oxygen Saturation is 93% on 2 L/min, adequate by my interpretation.    COORDINATION OF CARE: 11:17 AM Will order Tussionex, antibiotics, and prednisone. Will prescribe antibiotics, cough syrup, prednisone, and ear drops. Discussed treatment plan with pt at bedside and pt agreed to plan.  Labs Review Labs Reviewed  CBC WITH DIFFERENTIAL/PLATELET - Abnormal; Notable for the following:    Hemoglobin 15.4 (*)    Platelets 145 (*)    All other components within normal limits  COMPREHENSIVE METABOLIC PANEL - Abnormal; Notable for the following:  Chloride 96 (*)    Glucose, Bld 271 (*)    All other components within normal limits    Imaging Review Dg Chest 2 View  07/01/2015  CLINICAL DATA:  Cough for 3 days. EXAM: CHEST  2 VIEW COMPARISON:  October 20, 2013. FINDINGS: The heart size and mediastinal contours are within normal limits. Both lungs are clear. No pneumothorax or pleural effusion is noted. The visualized skeletal structures are unremarkable. IMPRESSION: No active cardiopulmonary disease. Electronically Signed   By: Lupita Raider, M.D.   On: 07/01/2015 10:33   I have personally reviewed and evaluated these images and lab results as part of my medical decision-making.   MDM   Final diagnoses:  Bronchitis  Right otitis externa   Patient is hemodynamically stable. Chest x-ray shows no pneumonia. Breathing treatment administered. Discharge medications Zithromax, prednisone, Tussionex, Cortisporin Otic I personally performed the services described in this documentation, which was scribed in my presence. The recorded information has been reviewed and is accurate.     Donnetta Hutching, MD 07/01/15 1319  Donnetta Hutching, MD 07/03/15 0030

## 2015-07-01 NOTE — ED Notes (Signed)
Sats dropping to 88% while at room air, Pt. Placed on [place] 1L.

## 2016-01-31 ENCOUNTER — Encounter: Payer: Self-pay | Admitting: Neurology

## 2016-01-31 ENCOUNTER — Ambulatory Visit (INDEPENDENT_AMBULATORY_CARE_PROVIDER_SITE_OTHER): Payer: Medicare Other | Admitting: Neurology

## 2016-01-31 VITALS — BP 127/81 | HR 62 | Ht 65.0 in | Wt 293.0 lb

## 2016-01-31 DIAGNOSIS — Z9981 Dependence on supplemental oxygen: Principal | ICD-10-CM

## 2016-01-31 DIAGNOSIS — E0800 Diabetes mellitus due to underlying condition with hyperosmolarity without nonketotic hyperglycemic-hyperosmolar coma (NKHHC): Secondary | ICD-10-CM

## 2016-01-31 DIAGNOSIS — G4736 Sleep related hypoventilation in conditions classified elsewhere: Secondary | ICD-10-CM | POA: Diagnosis not present

## 2016-01-31 DIAGNOSIS — R0902 Hypoxemia: Secondary | ICD-10-CM

## 2016-01-31 DIAGNOSIS — E669 Obesity, unspecified: Secondary | ICD-10-CM

## 2016-01-31 NOTE — Patient Instructions (Signed)

## 2016-01-31 NOTE — Progress Notes (Signed)
SLEEP MEDICINE CLINIC   Provider:  Melvyn Novas, M D  Referring Provider: Iona Hansen, NP Primary Care Physician:  Iona Hansen, NP  Chief Complaint  Patient presents with  . Rm 10  . New Patient (Initial Visit)    Sleep consult  . Sleep Apnea    Last sleep study > 10 yrs ago, no cpap    HPI:  [name] is a 50 y.o. female , seen here as a referral from NP Zoe Lan for management of sleep hypoxemia.   [name] . Reports that she had her last sleep study in 13-Nov-2002. She has been placed on oxygen and oxygen has been covered by Medicare. She had abnormal overnight pulse oximetry studies but I have not been able to find these in her referral possible. There is also no copy of her previous sleep study. She stated one time she used CPAP which was provided by [facility] but she no longer is compliant with CPAP. She was successful in weight loss at the time and told by her medical care giver that she would no longer requires a CPAP. At that time however her oxygen levels also stayed up all night-  which is no longer true. She is using an inhaler as needed, no history of bronchitis or pneumonia in the past year. No coughing reported. She is ongoing affected by essential hypertension, she does have diabetes mellitus, morbid obesity, and active tobacco use. She is using Lantus insulin now. She is smoking a half pack per day. She gained back weight . Diabetes was diagnosed last year at 380 pounds weight.   She is tearful, reporting that she lost her husband, mother and son. 19 year old Son died one year ago. She has a daughter and grandchild living near by.    Sleep habits are as follows: The patient sometimes is able to fall asleep at 11 and sometimes at 1 AM, but since she is still grieving her sleep habits and routines have been interrupted. Her bedroom is described as cool, quiet and dark she sleeps with her live in boyfriend. She sleeps on her left side, and sleeps on 2  pillows. Her boyfriend has reported that she snores and sleep talks. Every night between 2:30 and 3 AM she leaves the bed as she has the urge to urinate at that time. She will wake up around 5 AM again for bathroom call. She is usually able to go back to sleep she rises in the morning at 8:30 AM spontaneously, no need for an alarm. As long as she worked actively she rose already at 4 AM. She has not been gainfully employed since her ankle fracture on the left in 2012/11/12. 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    Sleep medical history and family sleep history: no family history of sleep disorder.  Social history:  Widowed after 6 years of marriage, her mother died in 2010/11/13 which was very close to her. She lost her son last year 2014/11/13 in a MVA on his way to work. Caffeine ; 1 cup coffee a week and tea twice a week. Sodas 3-4 a day.  ETOH; None,  1/2 ppd cigarettes for 35 years .  Review of Systems: Out of a complete 14 system review, the patient complains of only the following symptoms, and all other reviewed systems are negative.  The Epworth score was endorsed at 2 points except for the question lying down to rest  in the afternoon at 3 points. Fatigue severity score was endorsed at 48 points the geriatric depression scale was not given to the patient. Epworth score 15 , Fatigue severity score 48  , depression score must be high.    Social History   Social History  . Marital status: Widowed    Spouse name: N/A  . Number of children: 2  . Years of education: 12   Occupational History  . N/A    Social History Main Topics  . Smoking status: Current Every Day Smoker    Packs/day: 0.50    Years: 30.00    Types: Cigarettes  . Smokeless tobacco: Current User  . Alcohol use No  . Drug use: No  . Sexual activity: Yes    Birth control/ protection: Surgical   Other Topics Concern  . Not on file   Social History Narrative   Lives at home w/ roommates   Right-handed   Caffeine: occasional  coffee    Family History  Problem Relation Age of Onset  . Cancer Mother   . Heart failure Father     Past Medical History:  Diagnosis Date  . Anxiety   . Arthritis   . Asthma   . Blind   . Chronic respiratory failure (HCC)   . COPD (chronic obstructive pulmonary disease) (HCC)   . Depression   . Diabetes mellitus without complication (HCC)   . Glaucoma   . Hyperlipidemia   . Hypertension   . Shortness of breath   . Sleep apnea   . Thyroid disease     Past Surgical History:  Procedure Laterality Date  . ANKLE SURGERY Right 2007  . ORIF ANKLE FRACTURE Left 12/01/2012   Procedure: OPEN REDUCTION INTERNAL FIXATION (ORIF) ANKLE FRACTURE;  Surgeon: Darreld Mclean, MD;  Location: [facility];  Service: Orthopedics;  Laterality: Left;  . TONSILLECTOMY    . TUBAL LIGATION    . TUBAL LIGATION      Current Outpatient Prescriptions  Medication Sig Dispense Refill  . albuterol (PROVENTIL HFA;VENTOLIN HFA) 108 (90 BASE) MCG/ACT inhaler Inhale 1-2 puffs into the lungs every 6 (six) hours as needed for wheezing or shortness of breath.     . ALPRAZolam (XANAX) 0.5 MG tablet Take 0.5 mg by mouth 3 (three) times daily as needed for anxiety.    Marland Kitchen aspirin EC 81 MG tablet Take 81 mg by mouth daily.    Marland Kitchen atorvastatin (LIPITOR) 40 MG tablet Take 40 mg by mouth.    . Blood Glucose Monitoring Suppl (RA BLOOD GLUCOSE MONITOR) DEVI Test two times a day, once fasting, once 2 hours after meal    . canagliflozin (INVOKANA) 100 MG TABS tablet Take by mouth daily before breakfast.    . escitalopram (LEXAPRO) 20 MG tablet One po daily    . glucose blood test strip USE TO CHECK BLOOD SUGAR TWO TIMES DAILY, ONCE FASTING, ONCE 2 HOURS AFTER MEAL    . insulin glargine (LANTUS) 100 UNIT/ML injection Inject 40 Units into the skin at bedtime.     . Insulin Pen Needle 31G X 5 MM MISC Use with lantus pen once a day    . Lancets (ACCU-CHEK MULTICLIX) lancets Test two times a day, once fasting, once 2 hours after meal     . levothyroxine (SYNTHROID, LEVOTHROID) 200 MCG tablet Take 1 tablet (200 mcg total) by mouth daily before breakfast. 30 tablet 0  . olmesartan-hydrochlorothiazide (BENICAR HCT) 20-12.5 MG per tablet Take 1 tablet by mouth  daily.      No current facility-administered medications for this visit.     Allergies as of 01/31/2016  . (No Known Allergies)    Vitals: BP 127/81   Pulse 62   Ht 5\' 5"  (1.651 m)   Wt 293 lb (132.9 kg)   BMI 48.76 kg/m  Last Weight:  Wt Readings from Last 1 Encounters:  01/31/16 293 lb (132.9 kg)   WUJ:WJXB mass index is 48.76 kg/m.     Last Height:   Ht Readings from Last 1 Encounters:  01/31/16 5\' 5"  (1.651 m)    Physical exam:  General: The patient is awake, alert and appears not in acute distress. The patient is not well groomed. Head: Normocephalic, atraumatic. Neck is supple. Mallampati 5 neck circumference:20.5. Nasal airflow congested , TMJ click evident . Retrognathia is not seen. Poor dental status. Cardiovascular:  Regular rate and rhythm, without  murmurs or carotid bruit, and without distended neck veins. Respiratory: Lungs are clear to auscultation. Skin:  Without evidence of edema, or rash Trunk: BMI is super obese . The patient's posture is not erect, she is visibly depressed , tearful .  Neurologic exam : The patient is awake and alert, oriented to place and time.   Speech is with  dysarthria, dysphonia not  aphasia.  Mood and affect are depressed, tearful  Cranial nerves: Pupils are equal and briskly reactive to light. Funduscopic exam without evidence of pallor or edema.  Right eye blind- disconjugated gaze since 2007 . Extraocular movements ; Disconjugate- Glaucoma related nerve injuries. Visual fields by finger perimetry are intact. Hearing to finger rub intact.   Facial sensation intact to fine touch.  Facial motor strength is symmetric and tongue and uvula move midline. Shoulder shrug was symmetrical, but weak..   Motor  exam:  Decreased  tone, muscle bulk and symmetric strength .  Sensory:  Fine touch, pinprick and vibration, Proprioception tested in the upper extremities was normal.  Coordination: Rapid alternating movements /Finger-to-nose maneuver  normal without evidence of ataxia, dysmetria or tremor.  Gait and station: Patient walks without assistive device and is able unassisted to climb up to the exam table. Strength within normal limits.    Deep tendon reflexes: in the  upper and lower extremities are symmetric and intact.    Dear Zoe Lan, NP, I strongly suspect obesity hypoventilation to be present in your patient but in the setting of the tobacco habit to this may also be accentuated by COPD.  I will invite the patient for an attended sleep study to titrate her not just to oxygen, but a day as an criteria also require a CPAP titration should apnea be found. The patient has used CPAP in the past but didn't need it anymore after a successful weight loss. However she has not gained a lot of weight back and therefore her condition may have resurfaced.  The patient was advised of the nature of the diagnosed sleep disorder , the treatment options and risks for general a health and wellness arising from not treating the condition.  I spent more than 45  minutes of face to face time with the patient. Greater than 50% of time was spent in counseling and coordination of care. We have discussed the diagnosis and differential and I answered the patient's questions.     Assessment:  After physical and neurologic examination, review of laboratory studies,  Personal review of imaging studies, reports of other /same  Imaging studies ,  Results of  polysomnography/ neurophysiology testing and pre-existing records as far as provided in visit., my assessment is   1) Snoring, witnessed apnea, non restorative sleep - accentuated by grieving reaction.   2) suspect COPD , hypoxemia and hypercapnia  3) Morbid obesity  with super large neck and barrel chest    Plan:  Treatment plan and additional workup : SET PATIENT up for attended sleep study to s document Hypoxemia, hypercapnia and need for oxygen. THIS PATIENT needs to be titrated to oxygen. If apneas are also present , titrate CPAP, but this is secondary to oxygen !  Thank you.      Porfirio Mylar Luke Falero MD  01/31/2016   CC: Iona Hansen, Np [address] [place], [place] [address]

## 2016-02-08 ENCOUNTER — Ambulatory Visit (INDEPENDENT_AMBULATORY_CARE_PROVIDER_SITE_OTHER): Payer: Medicare Other | Admitting: Neurology

## 2016-02-08 DIAGNOSIS — G4733 Obstructive sleep apnea (adult) (pediatric): Secondary | ICD-10-CM

## 2016-02-08 DIAGNOSIS — G4736 Sleep related hypoventilation in conditions classified elsewhere: Secondary | ICD-10-CM

## 2016-02-08 DIAGNOSIS — E0800 Diabetes mellitus due to underlying condition with hyperosmolarity without nonketotic hyperglycemic-hyperosmolar coma (NKHHC): Secondary | ICD-10-CM

## 2016-02-08 DIAGNOSIS — Z9981 Dependence on supplemental oxygen: Principal | ICD-10-CM

## 2016-02-08 DIAGNOSIS — E669 Obesity, unspecified: Secondary | ICD-10-CM

## 2016-02-08 DIAGNOSIS — R0902 Hypoxemia: Secondary | ICD-10-CM

## 2016-02-18 ENCOUNTER — Telehealth: Payer: Self-pay

## 2016-02-18 DIAGNOSIS — G4733 Obstructive sleep apnea (adult) (pediatric): Secondary | ICD-10-CM

## 2016-02-18 NOTE — Telephone Encounter (Signed)
I spoke to pt. I advised her that her sleep study revealed severe osa and that Dr. Vickey Hugerohmeier recommended starting a cpap with 3 L of O2 Pt is agreeable to starting a CPAP. Pt [facility]. I advised pt to avoid sedative-hypnotics which may worsen sleep apnea, alcohol and tobacco. I advised pt to urgently lose weight, by diet and exercise. Pt says that she is no longer taking narcotics. I advised pt to avoid caffeine containing beverages and chocolate. I advised her that she should consider a pulmonary evaluation for COPD/hypoventilation. Pt is also agreeable to a pulmonologist referral. Pt verbalized understanding of results. Pt had no questions at this time but was encouraged to call back if questions arise. Will send a copy of sleep study to Zoe LanPenny Jones, NP.

## 2016-04-21 ENCOUNTER — Institutional Professional Consult (permissible substitution): Payer: Self-pay | Admitting: Pulmonary Disease

## 2016-04-23 ENCOUNTER — Encounter: Payer: Self-pay | Admitting: Neurology

## 2016-04-28 ENCOUNTER — Ambulatory Visit (INDEPENDENT_AMBULATORY_CARE_PROVIDER_SITE_OTHER): Payer: Medicare Other | Admitting: Neurology

## 2016-04-28 ENCOUNTER — Encounter: Payer: Self-pay | Admitting: Neurology

## 2016-04-28 VITALS — BP 123/76 | HR 59 | Resp 20 | Ht 64.0 in | Wt 288.0 lb

## 2016-04-28 DIAGNOSIS — J449 Chronic obstructive pulmonary disease, unspecified: Secondary | ICD-10-CM | POA: Diagnosis not present

## 2016-04-28 DIAGNOSIS — G4733 Obstructive sleep apnea (adult) (pediatric): Secondary | ICD-10-CM

## 2016-04-28 NOTE — Progress Notes (Signed)
SLEEP MED[facility]INE CLINIC   Provider:  Melvyn Novas, M D  Referring Provider: Iona Hansen, NP Primary Care Physician:  Iona Hansen, NP  Chief Complaint  Patient presents with  . Follow-up    cpap, feels better, sleeps the whole night now    HPI:  [name] is a 50 y.o. female , seen here as a referral from NP Zoe Lan for management of sleep hypoxemia.   [name] . reports that she had her last sleep study in Nov 07, 2002. She hasd been placed on oxygen and oxygen has been covered by Medicare.  She had abnormal overnight pulse oximetry studies but I have not been able to find these in her referral papers-  There is also no copy of her previous sleep study. She stated one time she used CPAP which [facility] -but she no longer is compliant with CPAP. She was successful in weight loss at the time and told by her medical care giver that she would no longer requires a CPAP. At that time however her oxygen levels also stayed up all night-  which is no longer true. She is using an inhaler as needed, no history of bronchitis or pneumonia in the past year. No coughing reported. She is ongoing affected by essential hypertension, she does have diabetes mellitus, morbid obesity, and active tobacco use. She is using Lantus insulin now. She is smoking a half pack per day. She gained back weight . Diabetes was diagnosed last year at 380 pounds weight.   Sleep habits are as follows: The patient sometimes is able to fall asleep at 11 and sometimes at 1 AM, but since she is still grieving her sleep habits and routines have been interrupted. Her bedroom is described as cool, quiet and dark she sleeps with her live in boyfriend. She sleeps on her left side, and sleeps on 2 pillows. Her boyfriend has reported that she snores and sleep talks. Every night between 2:30 and 3 AM she leaves the bed as she has the urge to urinate at that time. She will wake up around 5 AM again for bathroom  call. She is usually able to go back to sleep she rises in the morning at 8:30 AM spontaneously, no need for an alarm. As long as she worked actively she rose already at 4 AM. She has not been gainfully employed since her ankle fracture on the left in Nov 06, 2012. 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   Social history:  Widowed after 6 years of marriage, her mother died in 2010/11/07 which was very close to her. She lost her son last year 2014/11/07 in a MVA on his way to work. Caffeine ; 1 cup coffee a week and tea twice a week. Sodas 3-4 a day.  ETOH; None,  1/2 ppd cigarettes for 35 years .  Interval history from 04/28/2016  [name] is seen here in a follow-up after a split-night polysomnography, performed on 02/08/2016, which diagnosed her with severest sleep apnea. She is no diagnosed as having an AHI of 122.9 all sleep in non-supine position no REM sleep was noted during the baseline part of her split-night study. 79.5 minutes of the first 120 minutes were spent in desaturation . The patient was titrated to 14 cm water pressure but needed additionally 3 L of oxygen. Her AHI was significantly decreased and her sleep pattern and architecture improved. A full face mask was used during the  study. We are also reviewing her compliance downloads and she has been 100% compliant for CPAP at 12 cm water pressure with 3 cm EPR and the use of 3 L of oxygen bled into the CPAP. Her AHI was 4.1 a significant decrease from 122.9. She is able to sleep over 7 hours at night. She is more refreshed and more restored in AM . She keeps a more regular day schedule now.  This is also reflected in her Epworth sleepiness score, endorsed at one point and her fatigue severity at 29 points.    Review of Systems: Out of a complete 14 system review, the patient complains of only the following symptoms, and all other reviewed systems are negative.  The Epworth score was endorsed at 2 points except for the question lying down  to rest in the afternoon at 3 points. Fatigue severity score was endorsed at 48 points the geriatric depression scale was not given to the patient. Epworth score from 15 to 1 on CPAP  , Fatigue severity score 48 down to 12 , depression score must be high.    Social History   Social History  . Marital status: Widowed    Spouse name: N/A  . Number of children: 2  . Years of education: 12   Occupational History  . N/A    Social History Main Topics  . Smoking status: Current Every Day Smoker    Packs/day: 0.50    Years: 30.00    Types: Cigarettes  . Smokeless tobacco: Current User  . Alcohol use No  . Drug use: No  . Sexual activity: Yes    Birth control/ protection: Surgical   Other Topics Concern  . Not on file   Social History Narrative   Lives at home w/ roommates   Right-handed   Caffeine: occasional coffee    Family History  Problem Relation Age of Onset  . Cancer Mother   . Heart failure Father     Past Medical History:  Diagnosis Date  . Anxiety   . Arthritis   . Asthma   . Blind   . Chronic respiratory failure (HCC)   . COPD (chronic obstructive pulmonary disease) (HCC)   . Depression   . Diabetes mellitus without complication (HCC)   . Glaucoma   . Hyperlipidemia   . Hypertension   . Shortness of breath   . Sleep apnea   . Thyroid disease     Past Surgical History:  Procedure Laterality Date  . ANKLE SURGERY Right 2007  . ORIF ANKLE FRACTURE Left 12/01/2012   Procedure: OPEN REDUCTION INTERNAL FIXATION (ORIF) ANKLE FRACTURE;  Surgeon: Darreld McleanWayne Keeling, MD;  Location: [facility];  Service: Orthopedics;  Laterality: Left;  . TONSILLECTOMY    . TUBAL LIGATION    . TUBAL LIGATION      Current Outpatient Prescriptions  Medication Sig Dispense Refill  . albuterol (PROVENTIL HFA;VENTOLIN HFA) 108 (90 BASE) MCG/ACT inhaler Inhale 1-2 puffs into the lungs every 6 (six) hours as needed for wheezing or shortness of breath.     . ALPRAZolam (XANAX) 0.5 MG  tablet Take 0.5 mg by mouth 3 (three) times daily as needed for anxiety.    Marland Kitchen. aspirin EC 81 MG tablet Take 81 mg by mouth daily.    Marland Kitchen. atorvastatin (LIPITOR) 40 MG tablet Take 40 mg by mouth.    . Blood Glucose Monitoring Suppl (RA BLOOD GLUCOSE MONITOR) DEVI Test two times a day, once fasting, once 2 hours after  meal    . canagliflozin (INVOKANA) 100 MG TABS tablet Take by mouth daily before breakfast.    . escitalopram (LEXAPRO) 20 MG tablet One po daily    . glucose blood test strip USE TO CHECK BLOOD SUGAR TWO TIMES DAILY, ONCE FASTING, ONCE 2 HOURS AFTER MEAL    . insulin glargine (LANTUS) 100 UNIT/ML injection Inject 40 Units into the skin at bedtime.     . Insulin Pen Needle 31G X 5 MM MISC Use with lantus pen once a day    . Lancets (ACCU-CHEK MULTICLIX) lancets Test two times a day, once fasting, once 2 hours after meal    . levothyroxine (SYNTHROID, LEVOTHROID) 200 MCG tablet Take 1 tablet (200 mcg total) by mouth daily before breakfast. 30 tablet 0  . olmesartan-hydrochlorothiazide (BENICAR HCT) 20-12.5 MG per tablet Take 1 tablet by mouth daily.      No current facility-administered medications for this visit.     Allergies as of 04/28/2016  . (No Known Allergies)    Vitals: BP 123/76   Pulse (!) 59   Resp 20   Ht 5\' 4"  (1.626 m)   Wt 288 lb (130.6 kg)   BMI 49.44 kg/m  Last Weight:  Wt Readings from Last 1 Encounters:  04/28/16 288 lb (130.6 kg)   ZOX:WRUEBMI:Body mass index is 49.44 kg/m.     Last Height:   Ht Readings from Last 1 Encounters:  04/28/16 5\' 4"  (1.626 m)    Physical exam:  General: The patient is awake, alert and appears not in acute distress. Head: Normocephalic, atraumatic. Neck is supple. Mallampati 5 neck circumference:20.5.  Retrognathia is not seen. Poor dental status. Cardiovascular:  Regular rate and rhythm Respiratory: Lungs are clear to auscultation. Skin:  Without evidence of edema, or rash Trunk: The patient's posture is not erect, she is  visibly depressed , tearful .  Neurologic exam : The patient is awake and alert, oriented to place and time.   Speech is with  dysarthria, dysphonia not  aphasia.  Mood and affect are depressed, tearful  Cranial nerves: Pupils are equal and briskly reactive to light. Right eye blind- disconjugated gaze since 2007 . Extraocular movements ; Disconjugate gaze - Glaucoma related nerve injuries ( patient's report) .  Visual fields by finger perimetry are intact. Hearing to finger rub intact. Facial sensation intact to fine touch. Facial motor strength is symmetric, tongue and uvula move midline. Shoulder shrug was symmetrical, but weak.   Motor exam:  Decreased  tone, muscle bulk and symmetric strength . Sensory:  Fine touch, pinprick and vibration normal.  Coordination: Rapid alternating movements /Finger-to-nose maneuver without evidence of ataxia, dysmetria or tremor.    Dear Zoe LanPenny Jones, NP, I strongly suspect obesity hypoventilation to be present in your patient , but in the setting of her  tobacco use habit to this may also be COPD.  Indeed she was diagnosed with the most severe degree of apnea and the night of her sleep study, associated with prolonged periods of low oxygen. We needed to add oxygen to the CPAP to a nadir. It seems to be the combination of positive airway pressure and oxygen that allows her to sleep restfully. She will still need help to quit smoking and was weight loss. Her sleep hygiene has remarkably improved. Her depression is evident , but she is not as tearful as last visit. Her deceased son's birthday just passed and she wept, but was not as frantic as before.  The  patient was advised of the nature of the diagnosed sleep disorder , the treatment options and risks for general a health and wellness arising from not treating the condition.  I spent more than 25  minutes of face to face time with the patient.  Greater than 50% of time was spent in counseling and  coordination of care. She know that she needs to loose further weight , lost already 100 pounds over 26 month. She cut back to 1/2 ppd of cigarettes .  We have discussed the diagnosis and differential and I answered the patient's questions.  Patient asked me what she should do is the electricity goes out and in case of the power outage at her house she should sleep in a recliner. Battery support for oxygen and Pap therapy can be provided, she has a lot of power outage days during wintertime she reports. I will ask her DME to look at the possibilities of a battery pack.   Assessment:  After physical and neurologic examination, review of laboratory studies,  Personal review of imaging studies, reports of other /same  Imaging studies ,  Results of polysomnography/ neurophysiology testing and pre-existing records as far as provided in visit., my assessment is   1) Snoring, witnessed apnea. Confirmed as OSA . Now on CPAP - continue use at current settings. High compliance, with additional oxygen.   2) Depression, non restorative sleep - accentuated by grieving reaction.   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      Julie Mylar Shayonna Ocampo MD  04/28/2016   CC: Iona Hansen, [id number] [place], [place] [address]

## 2017-03-18 ENCOUNTER — Other Ambulatory Visit (HOSPITAL_COMMUNITY): Payer: Self-pay | Admitting: Nurse Practitioner

## 2017-03-18 DIAGNOSIS — Z1231 Encounter for screening mammogram for malignant neoplasm of breast: Secondary | ICD-10-CM

## 2017-03-25 ENCOUNTER — Ambulatory Visit (HOSPITAL_COMMUNITY)
Admission: RE | Admit: 2017-03-25 | Discharge: 2017-03-25 | Disposition: A | Discharge status: Home / Self Care | Payer: Medicare Other | Source: Ambulatory Visit | Attending: Nurse Practitioner | Admitting: Nurse Practitioner

## 2017-03-25 ENCOUNTER — Encounter (HOSPITAL_COMMUNITY): Payer: Self-pay | Admitting: Radiology

## 2017-03-25 DIAGNOSIS — R928 Other abnormal and inconclusive findings on diagnostic imaging of breast: Secondary | ICD-10-CM | POA: Insufficient documentation

## 2017-03-25 DIAGNOSIS — Z1231 Encounter for screening mammogram for malignant neoplasm of breast: Secondary | ICD-10-CM | POA: Diagnosis not present

## 2017-03-25 DIAGNOSIS — N6310 Unspecified lump in the right breast, unspecified quadrant: Secondary | ICD-10-CM | POA: Insufficient documentation

## 2017-03-31 ENCOUNTER — Other Ambulatory Visit (HOSPITAL_COMMUNITY): Payer: Self-pay | Admitting: Nurse Practitioner

## 2017-03-31 DIAGNOSIS — N6310 Unspecified lump in the right breast, unspecified quadrant: Secondary | ICD-10-CM

## 2017-04-07 ENCOUNTER — Ambulatory Visit (HOSPITAL_COMMUNITY)
Admission: RE | Admit: 2017-04-07 | Discharge: 2017-04-07 | Disposition: A | Discharge status: Home / Self Care | Payer: Medicare Other | Source: Ambulatory Visit | Attending: Nurse Practitioner | Admitting: Nurse Practitioner

## 2017-04-07 DIAGNOSIS — N6310 Unspecified lump in the right breast, unspecified quadrant: Secondary | ICD-10-CM

## 2017-04-07 DIAGNOSIS — N6312 Unspecified lump in the right breast, upper inner quadrant: Secondary | ICD-10-CM | POA: Diagnosis not present

## 2017-04-26 ENCOUNTER — Encounter: Payer: Self-pay | Admitting: Neurology

## 2017-04-28 ENCOUNTER — Encounter: Payer: Self-pay | Admitting: Neurology

## 2017-04-28 ENCOUNTER — Ambulatory Visit (INDEPENDENT_AMBULATORY_CARE_PROVIDER_SITE_OTHER): Payer: Medicare Other | Admitting: Neurology

## 2017-04-28 VITALS — BP 132/86 | Ht 64.0 in | Wt 273.5 lb

## 2017-04-28 DIAGNOSIS — J449 Chronic obstructive pulmonary disease, unspecified: Secondary | ICD-10-CM

## 2017-04-28 DIAGNOSIS — Z9989 Dependence on other enabling machines and devices: Secondary | ICD-10-CM

## 2017-04-28 DIAGNOSIS — G4733 Obstructive sleep apnea (adult) (pediatric): Secondary | ICD-10-CM

## 2017-04-28 DIAGNOSIS — G4734 Idiopathic sleep related nonobstructive alveolar hypoventilation: Secondary | ICD-10-CM | POA: Diagnosis not present

## 2017-04-28 NOTE — Patient Instructions (Addendum)
I reviewed the patient's CPAP compliance data:   CPAP and BiPAP Information CPAP and BiPAP are methods of helping a person breathe with the use of air pressure. CPAP stands for "continuous positive airway pressure." BiPAP stands for "bi-level positive airway pressure." In both methods, air is blown through your nose or mouth and into your air passages to help you breathe well. CPAP and BiPAP use different amounts of pressure to blow air. With CPAP, the amount of pressure stays the same while you breathe in and out. With BiPAP, the amount of pressure is increased when you breathe in (inhale) so that you can take larger breaths. Your health care provider will recommend whether CPAP or BiPAP would be more helpful for you. Why are CPAP and BiPAP treatments used? CPAP or BiPAP can be helpful if you have:  Sleep apnea.  Chronic obstructive pulmonary disease (COPD).  Heart failure.  Medical conditions that weaken the muscles of the chest including muscular dystrophy, or neurological diseases such as amyotrophic lateral sclerosis (ALS).  Other problems that cause breathing to be weak, abnormal, or difficult.  CPAP is most commonly used for obstructive sleep apnea (OSA) to keep the airways from collapsing when the muscles relax during sleep. How is CPAP or BiPAP administered? Both CPAP and BiPAP are provided by a small machine with a flexible plastic tube that attaches to a plastic mask. You wear the mask. Air is blown through the mask into your nose or mouth. The amount of pressure that is used to blow the air can be adjusted on the machine. Your health care provider will determine the pressure setting that should be used based on your individual needs. When should CPAP or BiPAP be used? In most cases, the mask only needs to be worn during sleep. Generally, the mask needs to be worn throughout the night and during any daytime naps. People with certain medical conditions may also need to wear the mask  at other times when they are awake. Follow instructions from your health care provider about when to use the machine. What are some tips for using the mask?  Because the mask needs to be snug, some people feel trapped or closed-in (claustrophobic) when first using the mask. If you feel this way, you may need to get used to the mask. One way to do this is by holding the mask loosely over your nose or mouth and then gradually applying the mask more snugly. You can also gradually increase the amount of time that you use the mask.  Masks are available in various types and sizes. Some fit over your mouth and nose while others fit over just your nose. If your mask does not fit well, talk with your health care provider about getting a different one.  If you are using a mask that fits over your nose and you tend to breathe through your mouth, a chin strap may be applied to help keep your mouth closed.  The CPAP and BiPAP machines have alarms that may sound if the mask comes off or develops a leak.  If you have trouble with the mask, it is very important that you talk with your health care provider about finding a way to make the mask easier to tolerate. Do not stop using the mask. Stopping the use of the mask could have a negative impact on your health. What are some tips for using the machine?  Place your CPAP or BiPAP machine on a secure table or stand  near an electrical outlet.  Know where the on/off switch is located on the machine.  Follow instructions from your health care provider about how to set the pressure on your machine and when you should use it.  Do not eat or drink while the CPAP or BiPAP machine is on. Food or fluids could get pushed into your lungs by the pressure of the CPAP or BiPAP.  Do not smoke. Tobacco smoke residue can damage the machine.  For home use, CPAP and BiPAP machines can be rented or purchased through home health care companies. Many different brands of machines are  available. Renting a machine before purchasing may help you find out which particular machine works well for you.  Keep the CPAP or BiPAP machine and attachments clean. Ask your health care provider for specific instructions. Get help right away if:  You have redness or open areas around your nose or mouth where the mask fits.  You have trouble using the CPAP or BiPAP machine.  You cannot tolerate wearing the CPAP or BiPAP mask.  You have pain, discomfort, and bloating in your abdomen. Summary  CPAP and BiPAP are methods of helping a person breathe with the use of air pressure.  Both CPAP and BiPAP are provided by a small machine with a flexible plastic tube that attaches to a plastic mask.  If you have trouble with the mask, it is very important that you talk with your health care provider about finding a way to make the mask easier to tolerate. This information is not intended to replace advice given to you by your health care provider. Make sure you discuss any questions you have with your health care provider. Document Released: 03/14/2004 Document Revised: 05/05/2016 Document Reviewed: 05/05/2016 Elsevier Interactive Patient Education  2017 [employer]  The average usage for all days was 2 hours and 57 minutes. The percent used days greater than 4 hours was 43%, indicating POOR:compliance. The residual AHI was 4.6  per hour.  Melvyn Novasarmen Trafton Roker, MD  [facility] ([facility])
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
## 2017-10-29 ENCOUNTER — Other Ambulatory Visit (HOSPITAL_COMMUNITY): Payer: Self-pay | Admitting: Nurse Practitioner

## 2017-10-29 DIAGNOSIS — R928 Other abnormal and inconclusive findings on diagnostic imaging of breast: Secondary | ICD-10-CM

## 2017-11-03 ENCOUNTER — Ambulatory Visit (HOSPITAL_COMMUNITY)
Admission: RE | Admit: 2017-11-03 | Discharge: 2017-11-03 | Disposition: A | Discharge status: Home / Self Care | Payer: Medicare Other | Source: Ambulatory Visit | Attending: Nurse Practitioner | Admitting: Nurse Practitioner

## 2017-11-03 ENCOUNTER — Encounter (HOSPITAL_COMMUNITY): Payer: Self-pay

## 2017-11-03 DIAGNOSIS — R928 Other abnormal and inconclusive findings on diagnostic imaging of breast: Secondary | ICD-10-CM

## 2017-11-03 DIAGNOSIS — N6312 Unspecified lump in the right breast, upper inner quadrant: Secondary | ICD-10-CM | POA: Diagnosis not present

## 2018-03-15 ENCOUNTER — Ambulatory Visit (INDEPENDENT_AMBULATORY_CARE_PROVIDER_SITE_OTHER): Payer: Medicare Other | Admitting: Obstetrics & Gynecology

## 2018-03-15 ENCOUNTER — Other Ambulatory Visit (HOSPITAL_COMMUNITY)
Admission: RE | Admit: 2018-03-15 | Discharge: 2018-03-15 | Disposition: A | Discharge status: Home / Self Care | Payer: Medicare Other | Source: Ambulatory Visit | Attending: Obstetrics & Gynecology | Admitting: Obstetrics & Gynecology

## 2018-03-15 ENCOUNTER — Encounter: Payer: Self-pay | Admitting: Obstetrics & Gynecology

## 2018-03-15 VITALS — BP 117/82 | HR 73 | Ht 65.0 in | Wt 270.0 lb

## 2018-03-15 DIAGNOSIS — N924 Excessive bleeding in the premenopausal period: Secondary | ICD-10-CM | POA: Insufficient documentation

## 2018-03-15 LAB — POCT HEMOGLOBIN: Hemoglobin: 13.5 g/dL (ref 12.2–16.2)

## 2018-03-15 MED ORDER — MEGESTROL ACETATE 40 MG PO TABS: ORAL_TABLET | ORAL | 3 refills | Status: DC

## 2018-03-15 NOTE — Progress Notes (Signed)
Chief Complaint  Patient presents with  . Menometrorrhagia      52 y.o. [id number] No LMP recorded. Patient is perimenopausal. The current method of family planning is tubal ligation.  Outpatient Encounter Medications as of 03/15/2018  Medication Sig Note  . ALPRAZolam (XANAX) 0.5 MG tablet Take 0.5 mg by mouth 3 (three) times daily as needed for anxiety.   Marland Kitchen atorvastatin (LIPITOR) 40 MG tablet Take 40 mg by mouth. 01/31/2016: Received from: [facility] Received Sig: Take 1 tablet (40 mg total) by mouth daily.  Marland Kitchen escitalopram (LEXAPRO) 20 MG tablet One po daily 01/31/2016: Received from: [facility]  . fluticasone (FLONASE) 50 MCG/ACT nasal spray Place into both nostrils daily.   . insulin degludec (TRESIBA FLEXTOUCH) 100 UNIT/ML SOPN FlexTouch Pen Inject into the skin daily.   Marland Kitchen latanoprost (XALATAN) 0.005 % ophthalmic solution 1 drop at bedtime.   Marland Kitchen levothyroxine (SYNTHROID, LEVOTHROID) 200 MCG tablet Take 1 tablet (200 mcg total) by mouth daily before breakfast.   . telmisartan (MICARDIS) 40 MG tablet Take 40 mg by mouth daily.   . megestrol (MEGACE) 40 MG tablet 3 tablets a day for 5 days, 2 tablets a day for 5 days then 1 tablet daily   . [DISCONTINUED] albuterol (PROVENTIL HFA;VENTOLIN HFA) 108 (90 BASE) MCG/ACT inhaler Inhale 1-2 puffs into the lungs every 6 (six) hours as needed for wheezing or shortness of breath.    . [DISCONTINUED] aspirin EC 81 MG tablet Take 81 mg by mouth daily.   . [DISCONTINUED] Blood Glucose Monitoring Suppl (RA BLOOD GLUCOSE MONITOR) DEVI Test two times a day, once fasting, once 2 hours after meal   . [DISCONTINUED] glucose blood test strip USE TO CHECK BLOOD SUGAR TWO TIMES DAILY, ONCE FASTING, ONCE 2 HOURS AFTER MEAL   . [DISCONTINUED] insulin glargine (LANTUS) 100 UNIT/ML injection Inject 40 Units into the skin at bedtime.    . [DISCONTINUED] Insulin Pen Needle 31G X 5 MM MISC Use with lantus pen once a day   . [DISCONTINUED] Lancets  (ACCU-CHEK MULTICLIX) lancets Test two times a day, once fasting, once 2 hours after meal   . [DISCONTINUED] olmesartan-hydrochlorothiazide (BENICAR HCT) 20-12.5 MG per tablet Take 1 tablet by mouth daily.     No facility-administered encounter medications on file as of 03/15/2018.     Subjective Regular periods until 3 years ago Since then wildly irregular spotting skipping Last 4 months has bled essentailly continuously  No cramps On baby ASA daily Past Medical History:  Diagnosis Date  . Anxiety   . Arthritis   . Asthma   . Blind   . Chronic respiratory failure (HCC)   . COPD (chronic obstructive pulmonary disease) (HCC)   . Depression   . Diabetes mellitus without complication (HCC)   . Glaucoma   . Hyperlipidemia   . Hypertension   . Shortness of breath   . Sleep apnea   . Thyroid disease     Past Surgical History:  Procedure Laterality Date  . ANKLE SURGERY Right 2007  . ORIF ANKLE FRACTURE Left 12/01/2012   Procedure: OPEN REDUCTION INTERNAL FIXATION (ORIF) ANKLE FRACTURE;  Surgeon: Darreld Mclean, MD;  Location: [facility];  Service: Orthopedics;  Laterality: Left;  . TONSILLECTOMY    . TUBAL LIGATION    . TUBAL LIGATION      OB History    Gravida  2   Para  2   Term  2   Preterm  AB      Living  2     SAB      TAB      Ectopic      Multiple      Live Births              No Known Allergies  Social History   Socioeconomic History  . Marital status: Widowed    Spouse name: Not on file  . Number of children: 2  . Years of education: 49  . Highest education level: Not on file  Occupational History  . Occupation: N/A  Social Needs  . Financial resource strain: Not on file  . Food insecurity:    Worry: Not on file    Inability: Not on file  . Transportation needs:    Medical: Not on file    Non-medical: Not on file  Tobacco Use  . Smoking status: Current Every Day Smoker    Packs/day: 0.50    Years: 30.00    Pack years: 15.00      Types: Cigarettes  . Smokeless tobacco: Current User  Substance and Sexual Activity  . Alcohol use: No  . Drug use: No  . Sexual activity: Yes    Birth control/protection: Surgical  Lifestyle  . Physical activity:    Days per week: Not on file    Minutes per session: Not on file  . Stress: Not on file  Relationships  . Social connections:    Talks on phone: Not on file    Gets together: Not on file    Attends religious service: Not on file    Active member of club or organization: Not on file    Attends meetings of clubs or organizations: Not on file    Relationship status: Not on file  Other Topics Concern  . Not on file  Social History Narrative   Lives at home w/ roommates   Right-handed   Caffeine: occasional coffee    Family History  Problem Relation Age of Onset  . Cancer Mother   . Heart failure Father     Medications:       Current Outpatient Medications:  .  ALPRAZolam (XANAX) 0.5 MG tablet, Take 0.5 mg by mouth 3 (three) times daily as needed for anxiety., Disp: , Rfl:  .  atorvastatin (LIPITOR) 40 MG tablet, Take 40 mg by mouth., Disp: , Rfl:  .  escitalopram (LEXAPRO) 20 MG tablet, One po daily, Disp: , Rfl:  .  fluticasone (FLONASE) 50 MCG/ACT nasal spray, Place into both nostrils daily., Disp: , Rfl:  .  insulin degludec (TRESIBA FLEXTOUCH) 100 UNIT/ML SOPN FlexTouch Pen, Inject into the skin daily., Disp: , Rfl:  .  latanoprost (XALATAN) 0.005 % ophthalmic solution, 1 drop at bedtime., Disp: , Rfl:  .  levothyroxine (SYNTHROID, LEVOTHROID) 200 MCG tablet, Take 1 tablet (200 mcg total) by mouth daily before breakfast., Disp: 30 tablet, Rfl: 0 .  telmisartan (MICARDIS) 40 MG tablet, Take 40 mg by mouth daily., Disp: , Rfl:  .  megestrol (MEGACE) 40 MG tablet, 3 tablets a day for 5 days, 2 tablets a day for 5 days then 1 tablet daily, Disp: 45 tablet, Rfl: 3  Objective Blood pressure 117/82, pulse 73, height 5\' 5"  (1.651 m), weight 270 lb (122.5  kg).  General WDWN female NAD Vulva:  normal appearing vulva with no masses, tenderness or lesions Vagina:  normal mucosa, no discharge Cervix:  no bleeding following Pap, no cervical motion  tenderness and no lesions Uterus:  normal size, contour, position, consistency, mobility, non-tender Adnexa: ovaries:present,  normal adnexa in size, nontender and no masses   Pertinent ROS No burning with urination, frequency or urgency No nausea, vomiting or diarrhea Nor fever chills or other constitutional symptoms   Labs or studies Hemoglobin today 13.5    Impression Diagnoses this Encounter::   ICD-10-CM   1. Excessive bleeding in premenopausal period N92.4 POCT hemoglobin    US PELVIS (TRANSABDOMINAL ONLY)    US PELVIS TRANSVANGINAL NON-OB (TV ONLY)    Established relevant diagnosis(es):   Plan/Recommendations: Meds ordered this encounter  Medications  . megestrol (MEGACE) 40 MG tablet    Sig: 3 tablets a day for 5 days, 2 tablets a day for 5 days then 1 tablet daily    Dispense:  45 tablet    Refill:  3    Labs or Scans Ordered: Orders Placed This Encounter  Procedures  . US PELVIS (TRANSABDOMINAL ONLY)  . US PELVIS TRANSVANGINAL NON-OB (TV ONLY)  . POCT hemoglobin    Management:: >megestrol algorithm >sonogram 3 weeks Proceed from there based on response and sonogram findings  Follow up Return in about 3 weeks (around 04/05/2018) for GYN sono, Follow up, with Dr Despina HiddenEure.       All questions were answered.

## 2018-03-15 NOTE — Addendum Note (Signed)
Addended by: Lazaro ArmsEURE, LUTHER H on: 03/15/2018 11:28 AM   Modules accepted: Orders

## 2018-03-15 NOTE — Addendum Note (Signed)
Addended by: Sherre LainASH, Hannelore Bova A on: 03/15/2018 11:35 AM   Modules accepted: Orders

## 2018-03-16 LAB — CYTOLOGY - PAP
Adequacy: ABSENT
CHLAMYDIA, DNA PROBE: NEGATIVE
Diagnosis: NEGATIVE
HPV: NOT DETECTED
Neisseria Gonorrhea: NEGATIVE

## 2018-03-29 ENCOUNTER — Other Ambulatory Visit (HOSPITAL_COMMUNITY): Payer: Self-pay | Admitting: Nurse Practitioner

## 2018-03-29 DIAGNOSIS — N6310 Unspecified lump in the right breast, unspecified quadrant: Secondary | ICD-10-CM

## 2018-04-05 ENCOUNTER — Encounter: Payer: Self-pay | Admitting: Obstetrics & Gynecology

## 2018-04-05 ENCOUNTER — Ambulatory Visit (INDEPENDENT_AMBULATORY_CARE_PROVIDER_SITE_OTHER): Payer: Medicare Other

## 2018-04-05 ENCOUNTER — Ambulatory Visit (INDEPENDENT_AMBULATORY_CARE_PROVIDER_SITE_OTHER): Payer: Medicare Other | Admitting: Obstetrics & Gynecology

## 2018-04-05 VITALS — BP 118/74 | HR 83 | Ht 64.0 in | Wt 267.0 lb

## 2018-04-05 DIAGNOSIS — N924 Excessive bleeding in the premenopausal period: Secondary | ICD-10-CM

## 2018-04-05 NOTE — Progress Notes (Signed)
PELVIC US TA/TV: heterogeneous anteverted uterus,no measurable fibroids visualized,complex thickened endometrium 21 mm (on Megace),no color flow within the endometrium,normal ovaries bilat,ovaries appear mobile,no pain during ultrasound,no free fluid

## 2018-04-05 NOTE — Progress Notes (Signed)
Follow up appointment for results  Chief Complaint  Patient presents with  . discuss [place]    Blood pressure 118/74, pulse 83, height 5\' 4"  (1.626 m), weight 267 lb (121.1 kg).    GYNECOLOGIC SONOGRAM   [name] is a 52 y.o. [id number] LMP 03/10/2018,she is here for a pelvic sonogram for abnormal uterine bleeding.  Uterus                      6.5 x 4.9 x 5.7 cm, vol 102 ml, heterogeneous anteverted uterus,no measurable fibroids visualized  Endometrium          21 mm, symmetrical, complexed thickened endometrium (on Megace),no color flow,no obvious polyps visualized   Right ovary             1.8 x 1.3 x 2 cm, wnl  Left ovary                3 x 2.3 x 3 cm, wnl  No free fluid   Technician Comments:  PELVIC US TA/TV: heterogeneous anteverted uterus,no measurable fibroids visualized,complex thickened endometrium 21 mm (on Megace),no color flow within the endometrium,normal ovaries bilat,ovaries appear mobile,no pain during ultrasound,no free fluid     [employer] 04/05/2018 11:18 AM  Clinical Impression and recommendations:  I have reviewed the sonogram results above, combined with the patient's current clinical course, below are my impressions and any appropriate recommendations for management based on the sonographic findings.  Uterus is normal, no fibroids Endometrium is decidualized by megestrol but no pathology Ovaries are normal   Lazaro Arms 04/05/2018 11:21 AM  [facility]S ordered this encounter: No orders of the defined types were placed in this encounter.   Orders for this encounter: No orders of the defined types were placed in this encounter.   Impression: Excessive bleeding in premenopausal period    Plan: Hysteroscopy uterine curettage Minerva ablation 04/28/2018 (Will limit her megestrol due to diabetes)  Follow Up: Return in about 1 month (around 05/06/2018) for Post Op, with Dr Despina Hidden.       Face to face time:  10  minutes  Greater than 50% of the visit time was spent in counseling and coordination of care with the patient.  The summary and outline of the counseling and care coordination is summarized in the note above.   All questions were answered.  Past Medical History:  Diagnosis Date  . Anxiety   . Arthritis   . Asthma   . Blind   . Chronic respiratory failure (HCC)   . COPD (chronic obstructive pulmonary disease) (HCC)   . Depression   . Diabetes mellitus without complication (HCC)   . Glaucoma   . Hyperlipidemia   . Hypertension   . Shortness of breath   . Sleep apnea   . Thyroid disease     Past Surgical History:  Procedure Laterality Date  . ANKLE SURGERY Right 2007  . ORIF ANKLE FRACTURE Left 12/01/2012   Procedure: OPEN REDUCTION INTERNAL FIXATION (ORIF) ANKLE FRACTURE;  Surgeon: Darreld Mclean, MD;  Location: [facility];  Service: Orthopedics;  Laterality: Left;  . TONSILLECTOMY    . TUBAL LIGATION    . TUBAL LIGATION      OB History    Gravida  2   Para  2   Term  2   Preterm      AB      Living  2     SAB  TAB      Ectopic      Multiple      Live Births              Allergies  Allergen Reactions  . Metformin Diarrhea    Social History   Socioeconomic History  . Marital status: Widowed    Spouse name: Not on file  . Number of children: 2  . Years of education: 71  . Highest education level: Not on file  Occupational History  . Occupation: N/A  Social Needs  . Financial resource strain: Not on file  . Food insecurity:    Worry: Not on file    Inability: Not on file  . Transportation needs:    Medical: Not on file    Non-medical: Not on file  Tobacco Use  . Smoking status: Current Every Day Smoker    Packs/day: 0.50    Years: 30.00    Pack years: 15.00    Types: Cigarettes  . Smokeless tobacco: Current User  Substance and Sexual Activity  . Alcohol use: No  . Drug use: No  . Sexual activity: Yes    Birth control/protection:  Surgical    Comment: tubal  Lifestyle  . Physical activity:    Days per week: Not on file    Minutes per session: Not on file  . Stress: Not on file  Relationships  . Social connections:    Talks on phone: Not on file    Gets together: Not on file    Attends religious service: Not on file    Active member of club or organization: Not on file    Attends meetings of clubs or organizations: Not on file    Relationship status: Not on file  Other Topics Concern  . Not on file  Social History Narrative   Lives at home w/ roommates   Right-handed   Caffeine: occasional coffee    Family History  Problem Relation Age of Onset  . Cancer Mother   . Heart failure Father   . Other Son        MVA

## 2018-04-16 NOTE — Patient Instructions (Signed)
[name]  04/16/2018     @PREFPERIOPPHARMACY @   Your procedure is scheduled on  04/28/2018.  Report to [name] at  615   A.M.  Call this number if you have problems the morning of surgery:  [phone]   Remember:  Do not eat or drink after midnight.                         Take these medicines the morning of surgery with A SIP OF WATER  Xanax ( if needed), lexapro, levothyroxine, protonix, micardis. Take 1/2 of your night time insulin. DO NOT take any medications for diabetes the morning of your surgery.    Do not wear jewelry, make-up or nail polish.  Do not wear lotions, powders, or perfumes, or deodorant.  Do not shave 48 hours prior to surgery.  Men may shave face and neck.  Do not bring valuables to the [facility].  [name] is not responsible for any belongings or valuables.  Contacts, dentures or bridgework may not be worn into surgery.  Leave your suitcase in the car.  After surgery it may be brought to your room.  For patients admitted to the [facility], discharge time will be determined by your treatment team.  Patients discharged the day of surgery will not be allowed to drive home.   Name and phone number of your driver:   family Special instructions:  None  Please read over the following fact sheets that you were given. Anesthesia Post-op Instructions and Care and Recovery After Surgery       Dilation and Curettage or Vacuum Curettage Dilation and curettage (D&C) and vacuum curettage are minor procedures. A D&C involves stretching (dilation) the cervix and scraping (curettage) the inside lining of the uterus (endometrium). During a D&C, tissue is gently scraped from the endometrium, starting from the top portion of the uterus down to the lowest part of the uterus (cervix). During a vacuum curettage, the lining and tissue in the uterus are removed with the use of gentle suction. Curettage may be performed to either  diagnose or treat a problem. As a diagnostic procedure, curettage is performed to examine tissues from the uterus. A diagnostic curettage may be done if you have:  Irregular bleeding in the uterus.  Bleeding with the development of clots.  Spotting between menstrual periods.  Prolonged menstrual periods or other abnormal bleeding.  Bleeding after menopause.  No menstrual period (amenorrhea).  A change in size and shape of the uterus.  Abnormal endometrial cells discovered during a Pap test.  As a treatment procedure, curettage may be performed for the following reasons:  Removal of an IUD (intrauterine device).  Removal of retained placenta after giving birth.  Abortion.  Miscarriage.  Removal of endometrial polyps.  Removal of uncommon types of noncancerous lumps (fibroids).  Tell a health care provider about:  Any allergies you have, including allergies to prescribed medicine or latex.  All medicines you are taking, including vitamins, herbs, eye drops, creams, and over-the-counter medicines. This is especially important if you take any blood-thinning medicine. Bring a list of all of your medicines to your appointment.  Any problems you or family members have had with anesthetic medicines.  Any blood disorders you have.  Any surgeries you have had.  Your medical history and any medical conditions you have.  Whether you are pregnant or may  be pregnant.  Recent vaginal infections you have had.  Recent menstrual periods, bleeding problems you have had, and what form of birth control (contraception) you use. What are the risks? Generally, this is a safe procedure. However, problems may occur, including:  Infection.  Heavy vaginal bleeding.  Allergic reactions to medicines.  Damage to the cervix or other structures or organs.  Development of scar tissue (adhesions) inside the uterus, which can cause abnormal amounts of menstrual bleeding. This may make it  harder to get pregnant in the future.  A hole (perforation) or puncture in the uterine wall. This is rare.  What happens before the procedure? Staying hydrated Follow instructions from your health care provider about hydration, which may include:  Up to 2 hours before the procedure - you may continue to drink clear liquids, such as water, clear fruit juice, black coffee, and plain tea.  Eating and drinking restrictions Follow instructions from your health care provider about eating and drinking, which may include:  8 hours before the procedure - stop eating heavy meals or foods such as meat, fried foods, or fatty foods.  6 hours before the procedure - stop eating light meals or foods, such as toast or cereal.  6 hours before the procedure - stop drinking milk or drinks that contain milk.  2 hours before the procedure - stop drinking clear liquids. If your health care provider told you to take your medicine(s) on the day of your procedure, take them with only a sip of water.  Medicines  Ask your health care provider about: ? Changing or stopping your regular medicines. This is especially important if you are taking diabetes medicines or blood thinners. ? Taking medicines such as aspirin and ibuprofen. These medicines can thin your blood. Do not take these medicines before your procedure if your health care provider instructs you not to.  You may be given antibiotic medicine to help prevent infection. General instructions  For 24 hours before your procedure, do not: ? Douche. ? Use tampons. ? Use medicines, creams, or suppositories in the vagina. ? Have sexual intercourse.  You may be given a pregnancy test on the day of the procedure.  Plan to have someone take you home from the [facility] or clinic.  You may have a blood or urine sample taken.  If you will be going home right after the procedure, plan to have someone with you for 24 hours. What happens during the  procedure?  To reduce your risk of infection: ? Your health care team will wash or sanitize their hands. ? Your skin will be washed with soap.  An IV tube will be inserted into one of your veins.  You will be given one of the following: ? A medicine that numbs the area in and around the cervix (local anesthetic). ? A medicine to make you fall asleep (general anesthetic).  You will lie down on your back, with your feet in foot rests (stirrups).  The size and position of your uterus will be checked.  A lubricated instrument (speculum or Sims retractor) will be inserted into the back side of your vagina. The speculum will be used to hold apart the walls of your vagina so your health care provider can see your cervix.  A tool (tenaculum) will be attached to the lip of the cervix to stabilize it.  Your cervix will be softened and dilated. This may be done by: ? Taking a medicine. ? Having tapered dilators or thin  rods (laminaria) or gradual widening instruments (tapered dilators) inserted into your cervix.  A small, sharp, curved instrument (curette) will be used to scrape a small amount of tissue or cells from the endometrium or cervical canal. In some cases, gentle suction is applied with the curette. The curette will then be removed. The cells will be taken to a lab for testing. The procedure may vary among health care providers and hospitals. What happens after the procedure?  You may have mild cramping, backache, pain, and light bleeding or spotting. You may pass small blood clots from your vagina.  You may have to wear compression stockings. These stockings help to prevent blood clots and reduce swelling in your legs.  Your blood pressure, heart rate, breathing rate, and blood oxygen level will be monitored until the medicines you were given have worn off. Summary  Dilation and curettage (D&C) involves stretching (dilation) the cervix and scraping (curettage) the inside lining of  the uterus (endometrium).  After the procedure, you may have mild cramping, backache, pain, and light bleeding or spotting. You may pass small blood clots from your vagina.  Plan to have someone take you home from the [facility] or clinic. This information is not intended to replace advice given to you by your health care provider. Make sure you discuss any questions you have with your health care provider. Document Released: 06/16/2005 Document Revised: 03/02/2016 Document Reviewed: 03/02/2016 Elsevier Interactive Patient Education  2018 [employer].  Dilation and Curettage or Vacuum Curettage, Care After These instructions give you information about caring for yourself after your procedure. Your doctor may also give you more specific instructions. Call your doctor if you have any problems or questions after your procedure. Follow these instructions at home: Activity  Do not drive or use heavy machinery while taking prescription pain medicine.  For 24 hours after your procedure, avoid driving.  Take short walks often, followed by rest periods. Ask your doctor what activities are safe for you. After one or two days, you may be able to return to your normal activities.  Do not lift anything that is heavier than 10 lb (4.5 kg) until your doctor approves.  For at least 2 weeks, or as long as told by your doctor: ? Do not douche. ? Do not use tampons. ? Do not have sex. General instructions  Take over-the-counter and prescription medicines only as told by your doctor. This is very important if you take blood thinning medicine.  Do not take baths, swim, or use a hot tub until your doctor approves. Take showers instead of baths.  Wear compression stockings as told by your doctor.  It is up to you to get the results of your procedure. Ask your doctor when your results will be ready.  Keep all follow-up visits as told by your doctor. This is important. Contact a doctor if:  You have  very bad cramps that get worse or do not get better with medicine.  You have very bad pain in your belly (abdomen).  You cannot drink fluids without throwing up (vomiting).  You get pain in a different part of the area between your belly and thighs (pelvis).  You have bad-smelling discharge from your vagina.  You have a rash. Get help right away if:  You are bleeding a lot from your vagina. A lot of bleeding means soaking more than one sanitary pad in an hour, for 2 hours in a row.  You have clumps of blood (blood clots)  coming from your vagina.  You have a fever or chills.  Your belly feels very tender or hard.  You have chest pain.  You have trouble breathing.  You cough up blood.  You feel dizzy.  You feel light-headed.  You pass out (faint).  You have pain in your neck or shoulder area. Summary  Take short walks often, followed by rest periods. Ask your doctor what activities are safe for you. After one or two days, you may be able to return to your normal activities.  Do not lift anything that is heavier than 10 lb (4.5 kg) until your doctor approves.  Do not take baths, swim, or use a hot tub until your doctor approves. Take showers instead of baths.  Contact your doctor if you have any symptoms of infection, like bad-smelling discharge from your vagina. This information is not intended to replace advice given to you by your health care provider. Make sure you discuss any questions you have with your health care provider. Document Released: 03/25/2008 Document Revised: 03/03/2016 Document Reviewed: 03/03/2016 Elsevier Interactive Patient Education  2017 [employer] Hysteroscopy Hysteroscopy is a procedure used for looking inside the womb (uterus). It may be done for various reasons, including:  To evaluate abnormal bleeding, fibroid (benign, noncancerous) tumors, polyps, scar tissue (adhesions), and possibly cancer of the uterus.  To look for lumps (tumors)  and other uterine growths.  To look for causes of why a woman cannot get pregnant (infertility), causes of recurrent loss of pregnancy (miscarriages), or a lost intrauterine device (IUD).  To perform a sterilization by blocking the fallopian tubes from inside the uterus.  In this procedure, a thin, flexible tube with a tiny light and camera on the end of it (hysteroscope) is used to look inside the uterus. A hysteroscopy should be done right after a menstrual period to be sure you are not pregnant. LET [facility] CARE PROVIDER KNOW ABOUT:  Any allergies you have.  All medicines you are taking, including vitamins, herbs, eye drops, creams, and over-the-counter medicines.  Previous problems you or members of your family have had with the use of anesthetics.  Any blood disorders you have.  Previous surgeries you have had.  Medical conditions you have. RISKS AND COMPLICATIONS Generally, this is a safe procedure. However, as with any procedure, complications can occur. Possible complications include:  Putting a hole in the uterus.  Excessive bleeding.  Infection.  Damage to the cervix.  Injury to other organs.  Allergic reaction to medicines.  Too much fluid used in the uterus for the procedure.  BEFORE THE PROCEDURE  Ask your health care provider about changing or stopping any regular medicines.  Do not take aspirin or blood thinners for 1 week before the procedure, or as directed by your health care provider. These can cause bleeding.  If you smoke, do not smoke for 2 weeks before the procedure.  In some cases, a medicine is placed in the cervix the day before the procedure. This medicine makes the cervix have a larger opening (dilate). This makes it easier for the instrument to be inserted into the uterus during the procedure.  Do not eat or drink anything for at least 8 hours before the surgery.  Arrange for someone to take you home after the  procedure. PROCEDURE  You may be given a medicine to relax you (sedative). You may also be given one of the following: ? A medicine that numbs the area around the cervix (local  anesthetic). ? A medicine that makes you sleep through the procedure (general anesthetic).  The hysteroscope is inserted through the vagina into the uterus. The camera on the hysteroscope sends a picture to a TV screen. This gives the surgeon a Julie view inside the uterus.  During the procedure, air or a liquid is put into the uterus, which allows the surgeon to see better.  Sometimes, tissue is gently scraped from inside the uterus. These tissue samples are sent to a lab for testing. What to expect after the procedure  If you had a general anesthetic, you may be groggy for a couple hours after the procedure.  If you had a local anesthetic, you will be able to go home as soon as you are stable and feel ready.  You may have some cramping. This normally lasts for a couple days.  You may have bleeding, which varies from light spotting for a few days to menstrual-like bleeding for 3-7 days. This is normal.  If your test results are not back during the visit, make an appointment with your health care provider to find out the results. This information is not intended to replace advice given to you by your health care provider. Make sure you discuss any questions you have with your health care provider. Document Released: 09/22/2000 Document Revised: 11/22/2015 Document Reviewed: 01/13/2013 Elsevier Interactive Patient Education  2017 [employer]. Hysteroscopy, Care After Refer to this sheet in the next few weeks. These instructions provide you with information on caring for yourself after your procedure. Your health care provider may also give you more specific instructions. Your treatment has been planned according to current medical practices, but problems sometimes occur. Call your health care provider if you have  any problems or questions after your procedure. What can I expect after the procedure? After your procedure, it is typical to have the following:  You may have some cramping. This normally lasts for a couple days.  You may have bleeding. This can vary from light spotting for a few days to menstrual-like bleeding for 3-7 days.  Follow these instructions at home:  Rest for the first 1-2 days after the procedure.  Only take over-the-counter or prescription medicines as directed by your health care provider. Do not take aspirin. It can increase the chances of bleeding.  Take showers instead of baths for 2 weeks or as directed by your health care provider.  Do not drive for 24 hours or as directed.  Do not drink alcohol while taking pain medicine.  Do not use tampons, douche, or have sexual intercourse for 2 weeks or until your health care provider says it is okay.  Take your temperature twice a day for 4-5 days. Write it down each time.  Follow your health care provider's advice about diet, exercise, and lifting.  If you develop constipation, you may: ? Take a mild laxative if your health care provider approves. ? Add bran foods to your diet. ? Drink enough fluids to keep your urine clear or pale yellow.  Try to have someone with you or available to you for the first 24-48 hours, especially if you were given a general anesthetic.  Follow up with your health care provider as directed. Contact a health care provider if:  You feel dizzy or lightheaded.  You feel sick to your stomach (nauseous).  You have abnormal vaginal discharge.  You have a rash.  You have pain that is not controlled with medicine. Get help right away if:  You have bleeding that is heavier than a normal menstrual period.  You have a fever.  You have increasing cramps or pain, not controlled with medicine.  You have new belly (abdominal) pain.  You pass out.  You have pain in the tops of your  shoulders (shoulder strap areas).  You have shortness of breath. This information is not intended to replace advice given to you by your health care provider. Make sure you discuss any questions you have with your health care provider. Document Released: 04/06/2013 Document Revised: 11/22/2015 Document Reviewed: 01/13/2013 Elsevier Interactive Patient Education  2017 [employer]  Endometrial Ablation Endometrial ablation is a procedure that destroys the thin inner layer of the lining of the uterus (endometrium). This procedure may be done:  To stop heavy periods.  To stop bleeding that is causing anemia.  To control irregular bleeding.  To treat bleeding caused by small tumors (fibroids) in the endometrium.  This procedure is often an alternative to major surgery, such as removal of the uterus and cervix (hysterectomy). As a result of this procedure:  You may not be able to have children. However, if you are premenopausal (you have not gone through menopause): ? You may still have a small chance of getting pregnant. ? You will need to use a reliable method of birth control after the procedure to prevent pregnancy.  You may stop having a menstrual period, or you may have only a small amount of bleeding during your period. Menstruation may return several years after the procedure.  Tell a health care provider about:  Any allergies you have.  All medicines you are taking, including vitamins, herbs, eye drops, creams, and over-the-counter medicines.  Any problems you or family members have had with the use of anesthetic medicines.  Any blood disorders you have.  Any surgeries you have had.  Any medical conditions you have. What are the risks? Generally, this is a safe procedure. However, problems may occur, including:  A hole (perforation) in the uterus or bowel.  Infection of the uterus, bladder, or vagina.  Bleeding.  Damage to other structures or organs.  An air  bubble in the lung (air embolus).  Problems with pregnancy after the procedure.  Failure of the procedure.  Decreased ability to diagnose cancer in the endometrium.  What happens before the procedure?  You will have tests of your endometrium to make sure there are no pre-cancerous cells or cancer cells present.  You may have an ultrasound of the uterus.  You may be given medicines to thin the endometrium.  Ask your health care provider about: ? Changing or stopping your regular medicines. This is especially important if you take diabetes medicines or blood thinners. ? Taking medicines such as aspirin and ibuprofen. These medicines can thin your blood. Do not take these medicines before your procedure if your doctor tells you not to.  Plan to have someone take you home from the [facility] or clinic. What happens during the procedure?  You will lie on an exam table with your feet and legs supported as in a pelvic exam.  To lower your risk of infection: ? Your health care team will wash or sanitize their hands and put on germ-free (sterile) gloves. ? Your genital area will be washed with soap.  An IV tube will be inserted into one of your veins.  You will be given a medicine to help you relax (sedative).  A surgical instrument with a light and camera (resectoscope) will  be inserted into your vagina and moved into your uterus. This allows your surgeon to see inside your uterus.  Endometrial tissue will be removed using one of the following methods: ? Radiofrequency. This method uses a radiofrequency-alternating electric current to remove the endometrium. ? Cryotherapy. This method uses extreme cold to freeze the endometrium. ? Heated-free liquid. This method uses a heated saltwater (saline) solution to remove the endometrium. ? Microwave. This method uses high-energy microwaves to heat up the endometrium and remove it. ? Thermal balloon. This method involves inserting a catheter  with a balloon tip into the uterus. The balloon tip is filled with heated fluid to remove the endometrium. The procedure may vary among health care providers and hospitals. What happens after the procedure?  Your blood pressure, heart rate, breathing rate, and blood oxygen level will be monitored until the medicines you were given have worn off.  As tissue healing occurs, you may notice vaginal bleeding for 4-6 weeks after the procedure. You may also experience: ? Cramps. ? Thin, watery vaginal discharge that is light pink or brown in color. ? A need to urinate more frequently than usual. ? Nausea.  Do not drive for 24 hours if you were given a sedative.  Do not have sex or insert anything into your vagina until your health care provider approves. Summary  Endometrial ablation is done to treat the many causes of heavy menstrual bleeding.  The procedure may be done only after medications have been tried to control the bleeding.  Plan to have someone take you home from the [facility] or clinic. This information is not intended to replace advice given to you by your health care provider. Make sure you discuss any questions you have with your health care provider. Document Released: 04/25/2004 Document Revised: 07/03/2016 Document Reviewed: 07/03/2016 Elsevier Interactive Patient Education  2017 [employer]  General Anesthesia, Adult General anesthesia is the use of medicines to make a person "go to sleep" (be unconscious) for a medical procedure. General anesthesia is often recommended when a procedure:  Is long.  Requires you to be still or in an unusual position.  Is major and can cause you to lose blood.  Is impossible to do without general anesthesia.  The medicines used for general anesthesia are called general anesthetics. In addition to making you sleep, the medicines:  Prevent pain.  Control your blood pressure.  Relax your muscles.  Tell a health care provider  about:  Any allergies you have.  All medicines you are taking, including vitamins, herbs, eye drops, creams, and over-the-counter medicines.  Any problems you or family members have had with anesthetic medicines.  Types of anesthetics you have had in the past.  Any bleeding disorders you have.  Any surgeries you have had.  Any medical conditions you have.  Any history of heart or lung conditions, such as heart failure, sleep apnea, or chronic obstructive pulmonary disease (COPD).  Whether you are pregnant or may be pregnant.  Whether you use tobacco, alcohol, marijuana, or street drugs.  Any history of Financial pl[employer].  Any history of depression or anxiety. What are the risks? Generally, this is a safe procedure. However, problems may occur, including:  Allergic reaction to anesthetics.  Lung and heart problems.  Inhaling food or liquids from your stomach into your lungs (aspiration).  Injury to nerves.  Waking up during your procedure and being unable to move (rare).  Extreme agitation or a state of mental confusion (delirium) when you wake  up from the anesthetic.  Air in the bloodstream, which can lead to stroke.  These problems are more likely to develop if you are having a major surgery or if you have an advanced medical condition. You can prevent some of these complications by answering all of your health care provider's questions thoroughly and by following all pre-procedure instructions. General anesthesia can cause side effects, including:  Nausea or vomiting  A sore throat from the breathing tube.  Feeling cold or shivery.  Feeling tired, washed out, or achy.  Sleepiness or drowsiness.  Confusion or agitation.  What happens before the procedure? Staying hydrated Follow instructions from your health care provider about hydration, which may include:  Up to 2 hours before the procedure - you may continue to drink clear liquids, such as water, clear  fruit juice, black coffee, and plain tea.  Eating and drinking restrictions Follow instructions from your health care provider about eating and drinking, which may include:  8 hours before the procedure - stop eating heavy meals or foods such as meat, fried foods, or fatty foods.  6 hours before the procedure - stop eating light meals or foods, such as toast or cereal.  6 hours before the procedure - stop drinking milk or drinks that contain milk.  2 hours before the procedure - stop drinking clear liquids.  Medicines  Ask your health care provider about: ? Changing or stopping your regular medicines. This is especially important if you are taking diabetes medicines or blood thinners. ? Taking medicines such as aspirin and ibuprofen. These medicines can thin your blood. Do not take these medicines before your procedure if your health care provider instructs you not to. ? Taking new dietary supplements or medicines. Do not take these during the week before your procedure unless your health care provider approves them.  If you are told to take a medicine or to continue taking a medicine on the day of the procedure, take the medicine with sips of water. General instructions   Ask if you will be going home the same day, the following day, or after a longer [facility] stay. ? Plan to have someone take you home. ? Plan to have someone stay with you for the first 24 hours after you leave the [facility] or clinic.  For 3-6 weeks before the procedure, try not to use any tobacco products, such as cigarettes, chewing tobacco, and e-cigarettes.  You may brush your teeth on the morning of the procedure, but make sure to spit out the toothpaste. What happens during the procedure?  You will be given anesthetics through a mask and through an IV tube in one of your veins.  You may receive medicine to help you relax (sedative).  As soon as you are asleep, a breathing tube may be used to help you  breathe.  An anesthesia specialist will stay with you throughout the procedure. He or she will help keep you comfortable and safe by continuing to give you medicines and adjusting the amount of medicine that you get. He or she will also watch your blood pressure, pulse, and oxygen levels to make sure that the anesthetics do not cause any problems.  If a breathing tube was used to help you breathe, it will be removed before you wake up. The procedure may vary among health care providers and hospitals. What happens after the procedure?  You will wake up, often slowly, after the procedure is complete, usually in a recovery area.  Your  blood pressure, heart rate, breathing rate, and blood oxygen level will be monitored until the medicines you were given have worn off.  You may be given medicine to help you calm down if you feel anxious or agitated.  If you will be going home the same day, your health care provider may check to make sure you can stand, drink, and urinate.  Your health care providers will treat your pain and side effects before you go home.  Do not drive for 24 hours if you received a sedative.  You may: ? Feel nauseous and vomit. ? Have a sore throat. ? Have mental slowness. ? Feel cold or shivery. ? Feel sleepy. ? Feel tired. ? Feel sore or achy, even in parts of your body where you did not have surgery. This information is not intended to replace advice given to you by your health care provider. Make sure you discuss any questions you have with your health care provider. Document Released: 09/23/2007 Document Revised: 11/27/2015 Document Reviewed: 05/31/2015 Elsevier Interactive Patient Education  2018 [employer]. General Anesthesia, Adult, Care After These instructions provide you with information about caring for yourself after your procedure. Your health care provider may also give you more specific instructions. Your treatment has been planned according to current  medical practices, but problems sometimes occur. Call your health care provider if you have any problems or questions after your procedure. What can I expect after the procedure? After the procedure, it is common to have:  Vomiting.  A sore throat.  Mental slowness.  It is common to feel:  Nauseous.  Cold or shivery.  Sleepy.  Tired.  Sore or achy, even in parts of your body where you did not have surgery.  Follow these instructions at home: For at least 24 hours after the procedure:  Do not: ? Participate in activities where you could fall or become injured. ? Drive. ? Use heavy machinery. ? Drink alcohol. ? Take sleeping pills or medicines that cause drowsiness. ? Make important decisions or sign legal documents. ? Take care of children on your own.  Rest. Eating and drinking  If you vomit, drink water, juice, or soup when you can drink without vomiting.  Drink enough fluid to keep your urine clear or pale yellow.  Make sure you have little or no nausea before eating solid foods.  Follow the diet recommended by your health care provider. General instructions  Have a responsible adult stay with you until you are awake and alert.  Return to your normal activities as told by your health care provider. Ask your health care provider what activities are safe for you.  Take over-the-counter and prescription medicines only as told by your health care provider.  If you smoke, do not smoke without supervision.  Keep all follow-up visits as told by your health care provider. This is important. Contact a health care provider if:  You continue to have nausea or vomiting at home, and medicines are not helpful.  You cannot drink fluids or start eating again.  You cannot urinate after 8-12 hours.  You develop a skin rash.  You have fever.  You have increasing redness at the site of your procedure. Get help right away if:  You have difficulty breathing.  You  have chest pain.  You have unexpected bleeding.  You feel that you are having a life-threatening or urgent problem. This information is not intended to replace advice given to you by your health care provider.  Make sure you discuss any questions you have with your health care provider. Document Released: 09/22/2000 Document Revised: 11/19/2015 Document Reviewed: 05/31/2015 Elsevier Interactive Patient Education  [employer].

## 2018-04-22 ENCOUNTER — Encounter (HOSPITAL_COMMUNITY)
Admission: RE | Admit: 2018-04-22 | Discharge: 2018-04-22 | Disposition: A | Discharge status: Home / Self Care | Payer: Medicare Other | Source: Ambulatory Visit | Attending: Obstetrics & Gynecology | Admitting: Obstetrics & Gynecology

## 2018-04-22 ENCOUNTER — Other Ambulatory Visit: Payer: Self-pay

## 2018-04-22 ENCOUNTER — Encounter (HOSPITAL_COMMUNITY): Payer: Self-pay

## 2018-04-22 ENCOUNTER — Inpatient Hospital Stay (HOSPITAL_COMMUNITY)
Admission: RE | Admit: 2018-04-22 | Discharge: 2018-04-22 | Disposition: A | Discharge status: Home / Self Care | Payer: Self-pay | Source: Ambulatory Visit

## 2018-04-22 ENCOUNTER — Other Ambulatory Visit: Payer: Self-pay | Admitting: Obstetrics & Gynecology

## 2018-04-22 DIAGNOSIS — E785 Hyperlipidemia, unspecified: Secondary | ICD-10-CM | POA: Diagnosis not present

## 2018-04-22 DIAGNOSIS — F17210 Nicotine dependence, cigarettes, uncomplicated: Secondary | ICD-10-CM | POA: Diagnosis not present

## 2018-04-22 DIAGNOSIS — I10 Essential (primary) hypertension: Secondary | ICD-10-CM | POA: Diagnosis not present

## 2018-04-22 DIAGNOSIS — J449 Chronic obstructive pulmonary disease, unspecified: Secondary | ICD-10-CM | POA: Insufficient documentation

## 2018-04-22 DIAGNOSIS — Z01812 Encounter for preprocedural laboratory examination: Secondary | ICD-10-CM | POA: Diagnosis present

## 2018-04-22 DIAGNOSIS — E119 Type 2 diabetes mellitus without complications: Secondary | ICD-10-CM | POA: Insufficient documentation

## 2018-04-22 DIAGNOSIS — N924 Excessive bleeding in the premenopausal period: Secondary | ICD-10-CM | POA: Insufficient documentation

## 2018-04-22 HISTORY — DX: Other specified postprocedural states: Z98.890

## 2018-04-22 HISTORY — DX: Nausea with vomiting, unspecified: R11.2

## 2018-04-22 HISTORY — DX: Hypothyroidism, unspecified: E03.9

## 2018-04-22 LAB — CBC
HEMATOCRIT: 38.3 % (ref 36.0–46.0)
HEMOGLOBIN: 13.5 g/dL (ref 12.0–15.0)
MCH: 33 pg (ref 26.0–34.0)
MCHC: 35.2 g/dL (ref 30.0–36.0)
MCV: 93.6 fL (ref 80.0–100.0)
Platelets: 175 10*3/uL (ref 150–400)
RBC: 4.09 MIL/uL (ref 3.87–5.11)
RDW: 12.5 % (ref 11.5–15.5)
WBC: 6.8 10*3/uL (ref 4.0–10.5)
nRBC: 0 % (ref 0.0–0.2)

## 2018-04-22 LAB — HCG, QUANTITATIVE, PREGNANCY

## 2018-04-22 LAB — URINALYSIS, ROUTINE W REFLEX MICROSCOPIC
BILIRUBIN URINE: NEGATIVE
GLUCOSE, UA: NEGATIVE mg/dL
Ketones, ur: NEGATIVE mg/dL
NITRITE: NEGATIVE
Protein, ur: NEGATIVE mg/dL
SPECIFIC GRAVITY, URINE: 1.024 (ref 1.005–1.030)
pH: 5 (ref 5.0–8.0)

## 2018-04-22 LAB — COMPREHENSIVE METABOLIC PANEL
ALBUMIN: 3.6 g/dL (ref 3.5–5.0)
ALK PHOS: 85 U/L (ref 38–126)
ALT: 39 U/L (ref 0–44)
AST: 31 U/L (ref 15–41)
Anion gap: 9 (ref 5–15)
BUN: 15 mg/dL (ref 6–20)
CALCIUM: 8.9 mg/dL (ref 8.9–10.3)
CO2: 20 mmol/L — ABNORMAL LOW (ref 22–32)
CREATININE: 0.68 mg/dL (ref 0.44–1.00)
Chloride: 106 mmol/L (ref 98–111)
Glucose, Bld: 97 mg/dL (ref 70–99)
Potassium: 3.7 mmol/L (ref 3.5–5.1)
Sodium: 135 mmol/L (ref 135–145)
Total Bilirubin: 0.7 mg/dL (ref 0.3–1.2)
Total Protein: 7.4 g/dL (ref 6.5–8.1)

## 2018-04-22 LAB — RAPID HIV SCREEN (HIV 1/2 AB+AG)
HIV 1/2 Antibodies: NONREACTIVE
HIV-1 P24 Antigen - HIV24: NONREACTIVE

## 2018-04-22 LAB — TYPE AND SCREEN
ABO/RH(D): B POS
ANTIBODY SCREEN: NEGATIVE

## 2018-04-22 LAB — GLUCOSE, CAPILLARY: Glucose-Capillary: 103 mg/dL — ABNORMAL HIGH (ref 70–99)

## 2018-04-23 LAB — HEMOGLOBIN A1C
HEMOGLOBIN A1C: 6.6 % — AB (ref 4.8–5.6)
Mean Plasma Glucose: 143 mg/dL

## 2018-04-23 NOTE — Pre-Procedure Instructions (Signed)
HgbA1C routed to PCP. 

## 2018-04-28 ENCOUNTER — Ambulatory Visit (HOSPITAL_COMMUNITY): Payer: Medicare Other | Admitting: Anesthesiology

## 2018-04-28 ENCOUNTER — Encounter (HOSPITAL_COMMUNITY)
Admission: RE | Disposition: A | Discharge status: Home / Self Care | Payer: Self-pay | Source: Ambulatory Visit | Attending: Obstetrics & Gynecology

## 2018-04-28 ENCOUNTER — Ambulatory Visit (HOSPITAL_COMMUNITY)
Admission: RE | Admit: 2018-04-28 | Discharge: 2018-04-28 | Disposition: A | Discharge status: Home / Self Care | Payer: Medicare Other | Source: Ambulatory Visit | Attending: Obstetrics & Gynecology | Admitting: Obstetrics & Gynecology

## 2018-04-28 ENCOUNTER — Encounter (HOSPITAL_COMMUNITY): Payer: Self-pay | Admitting: *Deleted

## 2018-04-28 DIAGNOSIS — G4730 Sleep apnea, unspecified: Secondary | ICD-10-CM | POA: Insufficient documentation

## 2018-04-28 DIAGNOSIS — J45909 Unspecified asthma, uncomplicated: Secondary | ICD-10-CM | POA: Insufficient documentation

## 2018-04-28 DIAGNOSIS — Z6841 Body Mass Index (BMI) 40.0 and over, adult: Secondary | ICD-10-CM | POA: Insufficient documentation

## 2018-04-28 DIAGNOSIS — M1990 Unspecified osteoarthritis, unspecified site: Secondary | ICD-10-CM | POA: Diagnosis not present

## 2018-04-28 DIAGNOSIS — Z79899 Other long term (current) drug therapy: Secondary | ICD-10-CM | POA: Insufficient documentation

## 2018-04-28 DIAGNOSIS — F17210 Nicotine dependence, cigarettes, uncomplicated: Secondary | ICD-10-CM | POA: Insufficient documentation

## 2018-04-28 DIAGNOSIS — J9611 Chronic respiratory failure with hypoxia: Secondary | ICD-10-CM | POA: Insufficient documentation

## 2018-04-28 DIAGNOSIS — F329 Major depressive disorder, single episode, unspecified: Secondary | ICD-10-CM | POA: Insufficient documentation

## 2018-04-28 DIAGNOSIS — H409 Unspecified glaucoma: Secondary | ICD-10-CM | POA: Insufficient documentation

## 2018-04-28 DIAGNOSIS — I10 Essential (primary) hypertension: Secondary | ICD-10-CM | POA: Insufficient documentation

## 2018-04-28 DIAGNOSIS — J449 Chronic obstructive pulmonary disease, unspecified: Secondary | ICD-10-CM | POA: Insufficient documentation

## 2018-04-28 DIAGNOSIS — F419 Anxiety disorder, unspecified: Secondary | ICD-10-CM | POA: Diagnosis not present

## 2018-04-28 DIAGNOSIS — Z888 Allergy status to other drugs, medicaments and biological substances status: Secondary | ICD-10-CM | POA: Insufficient documentation

## 2018-04-28 DIAGNOSIS — E039 Hypothyroidism, unspecified: Secondary | ICD-10-CM | POA: Diagnosis not present

## 2018-04-28 DIAGNOSIS — N950 Postmenopausal bleeding: Secondary | ICD-10-CM

## 2018-04-28 DIAGNOSIS — N924 Excessive bleeding in the premenopausal period: Secondary | ICD-10-CM | POA: Diagnosis present

## 2018-04-28 DIAGNOSIS — E119 Type 2 diabetes mellitus without complications: Secondary | ICD-10-CM | POA: Insufficient documentation

## 2018-04-28 DIAGNOSIS — E785 Hyperlipidemia, unspecified: Secondary | ICD-10-CM | POA: Insufficient documentation

## 2018-04-28 DIAGNOSIS — H547 Unspecified visual loss: Secondary | ICD-10-CM | POA: Insufficient documentation

## 2018-04-28 DIAGNOSIS — N946 Dysmenorrhea, unspecified: Secondary | ICD-10-CM | POA: Diagnosis not present

## 2018-04-28 LAB — GLUCOSE, CAPILLARY
GLUCOSE-CAPILLARY: 111 mg/dL — AB (ref 70–99)
Glucose-Capillary: 122 mg/dL — ABNORMAL HIGH (ref 70–99)

## 2018-04-28 SURGERY — DILATATION AND CURETTAGE/HYSTEROSCOPY WITH MINERVA
Anesthesia: General

## 2018-04-28 MED ORDER — FENTANYL CITRATE (PF) 100 MCG/2ML IJ SOLN
INTRAMUSCULAR | Status: AC
  Filled 2018-04-28: qty 2

## 2018-04-28 MED ORDER — FENTANYL CITRATE (PF) 100 MCG/2ML IJ SOLN
INTRAMUSCULAR | Status: DC | PRN
  Administered 2018-04-28 (×2): 50 ug via INTRAVENOUS

## 2018-04-28 MED ORDER — SUGAMMADEX SODIUM 200 MG/2ML IV SOLN
INTRAVENOUS | Status: AC
  Filled 2018-04-28: qty 2

## 2018-04-28 MED ORDER — SODIUM CHLORIDE 0.9 % IJ SOLN
INTRAMUSCULAR | Status: AC
  Filled 2018-04-28: qty 10

## 2018-04-28 MED ORDER — HYDROCODONE-ACETAMINOPHEN 5-325 MG PO TABS: 1.0000 | ORAL_TABLET | Freq: Four times a day (QID) | ORAL | 0 refills | Status: DC | PRN

## 2018-04-28 MED ORDER — EPHEDRINE SULFATE 50 MG/ML IJ SOLN
INTRAMUSCULAR | Status: AC
  Filled 2018-04-28: qty 1

## 2018-04-28 MED ORDER — ONDANSETRON 8 MG PO TBDP: 8.0000 mg | ORAL_TABLET | Freq: Three times a day (TID) | ORAL | 0 refills | Status: DC | PRN

## 2018-04-28 MED ORDER — PROPOFOL 10 MG/ML IV BOLUS
INTRAVENOUS | Status: AC
  Filled 2018-04-28: qty 20

## 2018-04-28 MED ORDER — HYDROCODONE-ACETAMINOPHEN 7.5-325 MG PO TABS: 1.0000 | ORAL_TABLET | Freq: Once | ORAL | Status: DC | PRN

## 2018-04-28 MED ORDER — MIDAZOLAM HCL 2 MG/2ML IJ SOLN
INTRAMUSCULAR | Status: AC
  Filled 2018-04-28: qty 2

## 2018-04-28 MED ORDER — KETOROLAC TROMETHAMINE 30 MG/ML IJ SOLN
30.0000 mg | Freq: Once | INTRAMUSCULAR | Status: AC
  Administered 2018-04-28: 30 mg via INTRAVENOUS
  Filled 2018-04-28: qty 1

## 2018-04-28 MED ORDER — MIDAZOLAM HCL 2 MG/2ML IJ SOLN: 0.5000 mg | Freq: Once | INTRAMUSCULAR | Status: DC | PRN

## 2018-04-28 MED ORDER — PROPOFOL 10 MG/ML IV BOLUS
INTRAVENOUS | Status: DC | PRN
  Administered 2018-04-28: 200 mg via INTRAVENOUS

## 2018-04-28 MED ORDER — SUCCINYLCHOLINE CHLORIDE 20 MG/ML IJ SOLN
INTRAMUSCULAR | Status: DC | PRN
  Administered 2018-04-28: 140 mg via INTRAVENOUS

## 2018-04-28 MED ORDER — LIDOCAINE HCL (PF) 1 % IJ SOLN
INTRAMUSCULAR | Status: AC
  Filled 2018-04-28: qty 5

## 2018-04-28 MED ORDER — SODIUM CHLORIDE 0.9 % IR SOLN
Status: DC | PRN
  Administered 2018-04-28: 1000 mL

## 2018-04-28 MED ORDER — EPHEDRINE SULFATE 50 MG/ML IJ SOLN
INTRAMUSCULAR | Status: DC | PRN
  Administered 2018-04-28: 10 mg via INTRAVENOUS

## 2018-04-28 MED ORDER — KETOROLAC TROMETHAMINE 10 MG PO TABS: 10.0000 mg | ORAL_TABLET | Freq: Three times a day (TID) | ORAL | 0 refills | Status: DC | PRN

## 2018-04-28 MED ORDER — HYDROMORPHONE HCL 1 MG/ML IJ SOLN: 0.2500 mg | INTRAMUSCULAR | Status: DC | PRN

## 2018-04-28 MED ORDER — PROMETHAZINE HCL 25 MG/ML IJ SOLN
6.2500 mg | INTRAMUSCULAR | Status: DC | PRN
  Administered 2018-04-28: 6.25 mg via INTRAVENOUS
  Filled 2018-04-28: qty 1

## 2018-04-28 MED ORDER — ROCURONIUM BROMIDE 50 MG/5ML IV SOLN
INTRAVENOUS | Status: AC
  Filled 2018-04-28: qty 1

## 2018-04-28 MED ORDER — SUGAMMADEX SODIUM 500 MG/5ML IV SOLN
INTRAVENOUS | Status: DC | PRN
  Administered 2018-04-28: 100 mg via INTRAVENOUS
  Administered 2018-04-28: 200 mg via INTRAVENOUS

## 2018-04-28 MED ORDER — LIDOCAINE HCL 1 % IJ SOLN
INTRAMUSCULAR | Status: DC | PRN
  Administered 2018-04-28: 40 mg via INTRADERMAL

## 2018-04-28 MED ORDER — DEXTROSE 5 % IV SOLN
3.0000 g | INTRAVENOUS | Status: AC
  Administered 2018-04-28: 3 g via INTRAVENOUS
  Filled 2018-04-28: qty 3000

## 2018-04-28 MED ORDER — LACTATED RINGERS IV SOLN
INTRAVENOUS | Status: DC
  Administered 2018-04-28: 1000 mL via INTRAVENOUS
  Administered 2018-04-28: 08:00:00 via INTRAVENOUS

## 2018-04-28 MED ORDER — MIDAZOLAM HCL 5 MG/5ML IJ SOLN
INTRAMUSCULAR | Status: DC | PRN
  Administered 2018-04-28: 2 mg via INTRAVENOUS

## 2018-04-28 MED ORDER — ROCURONIUM BROMIDE 100 MG/10ML IV SOLN
INTRAVENOUS | Status: DC | PRN
  Administered 2018-04-28: 5 mg via INTRAVENOUS
  Administered 2018-04-28: 20 mg via INTRAVENOUS

## 2018-04-28 MED ORDER — 0.9 % SODIUM CHLORIDE (POUR BTL) OPTIME
TOPICAL | Status: DC | PRN
  Administered 2018-04-28: 1000 mL

## 2018-04-28 MED ORDER — ONDANSETRON HCL 4 MG/2ML IJ SOLN
INTRAMUSCULAR | Status: DC | PRN
  Administered 2018-04-28: 4 mg via INTRAVENOUS

## 2018-04-28 SURGICAL SUPPLY — 27 items
CLOTH BEACON ORANGE TIMEOUT ST (SAFETY) ×3 IMPLANT
COVER LIGHT HANDLE STERIS (MISCELLANEOUS) ×6 IMPLANT
GAUZE 4X4 16PLY RFD (DISPOSABLE) ×6 IMPLANT
GLOVE BIOGEL PI IND STRL 7.0 (GLOVE) ×2 IMPLANT
GLOVE BIOGEL PI IND STRL 8 (GLOVE) ×1 IMPLANT
GLOVE BIOGEL PI INDICATOR 7.0 (GLOVE) ×4
GLOVE BIOGEL PI INDICATOR 8 (GLOVE) ×2
GLOVE ECLIPSE 6.5 STRL STRAW (GLOVE) ×2 IMPLANT
GLOVE ECLIPSE 8.0 STRL XLNG CF (GLOVE) ×3 IMPLANT
GOWN STRL REUS W/TWL LRG LVL3 (GOWN DISPOSABLE) ×3 IMPLANT
GOWN STRL REUS W/TWL XL LVL3 (GOWN DISPOSABLE) ×3 IMPLANT
HANDPIECE ABLA MINERVA ENDO (MISCELLANEOUS) ×3 IMPLANT
INST SET HYSTEROSCOPY (KITS) ×3 IMPLANT
IV NS 1000ML (IV SOLUTION) ×3
IV NS 1000ML BAXH (IV SOLUTION) ×1 IMPLANT
KIT TURNOVER CYSTO (KITS) ×3 IMPLANT
MANIFOLD NEPTUNE II (INSTRUMENTS) ×3 IMPLANT
MARKER SKIN DUAL TIP RULER LAB (MISCELLANEOUS) ×3 IMPLANT
NS IRRIG 1000ML POUR BTL (IV SOLUTION) ×3 IMPLANT
PACK BASIC III (CUSTOM PROCEDURE TRAY) ×3
PACK SRG BSC III STRL LF ECLPS (CUSTOM PROCEDURE TRAY) ×1 IMPLANT
PAD ARMBOARD 7.5X6 YLW CONV (MISCELLANEOUS) ×3 IMPLANT
PAD TELFA 3X4 1S STER (GAUZE/BANDAGES/DRESSINGS) ×3 IMPLANT
SET BASIN LINEN APH (SET/KITS/TRAYS/PACK) ×3 IMPLANT
SET IRRIG Y TYPE TUR BLADDER L (SET/KITS/TRAYS/PACK) ×3 IMPLANT
SHEET LAVH (DRAPES) ×3 IMPLANT
YANKAUER SUCT BULB TIP 10FT TU (MISCELLANEOUS) ×3 IMPLANT

## 2018-04-28 NOTE — Anesthesia Procedure Notes (Signed)
Procedure Name: Intubation Date/Time: 04/28/2018 7:40 AM Performed by: Charmaine Downs, CRNA Pre-anesthesia Checklist: Timeout performed, Patient identified, Emergency Drugs available, Suction available and Patient being monitored Patient Re-evaluated:Patient Re-evaluated prior to induction Oxygen Delivery Method: Circle system utilized Preoxygenation: Pre-oxygenation with 100% oxygen Induction Type: IV induction, Cricoid Pressure applied and Rapid sequence Laryngoscope Size: Mac and 3 Grade View: Grade II Tube type: Oral Tube size: 7.0 mm Number of attempts: 1 Airway Equipment and Method: Stylet Placement Confirmation: ETT inserted through vocal cords under direct vision,  positive ETCO2 and breath sounds checked- equal and bilateral Secured at: 21 cm Tube secured with: Tape Dental Injury: Teeth and Oropharynx as per pre-operative assessment

## 2018-04-28 NOTE — Discharge Instructions (Signed)
Endometrial Ablation °Endometrial ablation is a procedure that destroys the thin inner layer of the lining of the uterus (endometrium). This procedure may be done: °· To stop heavy periods. °· To stop bleeding that is causing anemia. °· To control irregular bleeding. °· To treat bleeding caused by small tumors (fibroids) in the endometrium. ° °This procedure is often an alternative to major surgery, such as removal of the uterus and cervix (hysterectomy). As a result of this procedure: °· You may not be able to have children. However, if you are premenopausal (you have not gone through menopause): °? You may still have a small chance of getting pregnant. °? You will need to use a reliable method of birth control after the procedure to prevent pregnancy. °· You may stop having a menstrual period, or you may have only a small amount of bleeding during your period. Menstruation may return several years after the procedure. ° °Tell a health care provider about: °· Any allergies you have. °· All medicines you are taking, including vitamins, herbs, eye drops, creams, and over-the-counter medicines. °· Any problems you or family members have had with the use of anesthetic medicines. °· Any blood disorders you have. °· Any surgeries you have had. °· Any medical conditions you have. °What are the risks? °Generally, this is a safe procedure. However, problems may occur, including: °· A hole (perforation) in the uterus or bowel. °· Infection of the uterus, bladder, or vagina. °· Bleeding. °· Damage to other structures or organs. °· An air bubble in the lung (air embolus). °· Problems with pregnancy after the procedure. °· Failure of the procedure. °· Decreased ability to diagnose cancer in the endometrium. ° °What happens before the procedure? °· You will have tests of your endometrium to make sure there are no pre-cancerous cells or cancer cells present. °· You may have an ultrasound of the uterus. °· You may be given  medicines to thin the endometrium. °· Ask your health care provider about: °? Changing or stopping your regular medicines. This is especially important if you take diabetes medicines or blood thinners. °? Taking medicines such as aspirin and ibuprofen. These medicines can thin your blood. Do not take these medicines before your procedure if your doctor tells you not to. °· Plan to have someone take you home from the hospital or clinic. °What happens during the procedure? °· You will lie on an exam table with your feet and legs supported as in a pelvic exam. °· To lower your risk of infection: °? Your health care team will wash or sanitize their hands and put on germ-free (sterile) gloves. °? Your genital area will be washed with soap. °· An IV tube will be inserted into one of your veins. °· You will be given a medicine to help you relax (sedative). °· A surgical instrument with a light and camera (resectoscope) will be inserted into your vagina and moved into your uterus. This allows your surgeon to see inside your uterus. °· Endometrial tissue will be removed using one of the following methods: °? Radiofrequency. This method uses a radiofrequency-alternating electric current to remove the endometrium. °? Cryotherapy. This method uses extreme cold to freeze the endometrium. °? Heated-free liquid. This method uses a heated saltwater (saline) solution to remove the endometrium. °? Microwave. This method uses high-energy microwaves to heat up the endometrium and remove it. °? Thermal balloon. This method involves inserting a catheter with a balloon tip into the uterus. The balloon tip is   filled with heated fluid to remove the endometrium. °The procedure may vary among health care providers and hospitals. °What happens after the procedure? °· Your blood pressure, heart rate, breathing rate, and blood oxygen level will be monitored until the medicines you were given have worn off. °· As tissue healing occurs, you may  notice vaginal bleeding for 4-6 weeks after the procedure. You may also experience: °? Cramps. °? Thin, watery vaginal discharge that is light pink or brown in color. °? A need to urinate more frequently than usual. °? Nausea. °· Do not drive for 24 hours if you were given a sedative. °· Do not have sex or insert anything into your vagina until your health care provider approves. °Summary °· Endometrial ablation is done to treat the many causes of heavy menstrual bleeding. °· The procedure may be done only after medications have been tried to control the bleeding. °· Plan to have someone take you home from the hospital or clinic. °This information is not intended to replace advice given to you by your health care provider. Make sure you discuss any questions you have with your health care provider. °Document Released: 04/25/2004 Document Revised: 07/03/2016 Document Reviewed: 07/03/2016 °Elsevier Interactive Patient Education © 2017 [employer] ° °PATIENT INSTRUCTIONS °POST-ANESTHESIA ° °IMMEDIATELY FOLLOWING SURGERY:  Do not drive or operate machinery for the first twenty four hours after surgery.  Do not make any important decisions for twenty four hours after surgery or while taking narcotic pain medications or sedatives.  If you develop intractable nausea and vomiting or a severe headache please notify your doctor immediately. ° °FOLLOW-UP:  Please make an appointment with your surgeon as instructed. You do not need to follow up with anesthesia unless specifically instructed to do so. ° °WOUND CARE INSTRUCTIONS (if applicable):  Keep a dry clean dressing on the anesthesia/puncture wound site if there is drainage.  Once the wound has quit draining you may leave it open to air.  Generally you should leave the bandage intact for twenty four hours unless there is drainage.  If the epidural site drains for more than 36-48 hours please call the anesthesia department. ° °QUESTIONS?:  Please feel free to call your  physician or the hospital operator if you have any questions, and they will be happy to assist you.    ° ° ° ° °

## 2018-04-28 NOTE — H&P (Signed)
Preoperative History and Physical  [name] is a 52 y.o. [id number] with No LMP recorded. (Menstrual status: Other). admitted for a hysteroscopy uterine curettage endometrial ablation.  Regular periods until 3 years ago Since then wildly irregular spotting skipping Last 4 months has bled essentailly continuously  No cramps On baby ASA daily  PMH:    Past Medical History:  Diagnosis Date  . Anxiety   . Arthritis   . Asthma   . Blind   . Chronic respiratory failure (HCC)   . COPD (chronic obstructive pulmonary disease) (HCC)   . Depression   . Diabetes mellitus without complication (HCC)   . Glaucoma   . Hyperlipidemia   . Hypertension   . Hypothyroidism   . PONV (postoperative nausea and vomiting)   . Shortness of breath   . Sleep apnea    12  . Thyroid disease     PSH:     Past Surgical History:  Procedure Laterality Date  . ANKLE SURGERY Right 2007  . ORIF ANKLE FRACTURE Left 12/01/2012   Procedure: OPEN REDUCTION INTERNAL FIXATION (ORIF) ANKLE FRACTURE;  Surgeon: Darreld Mclean, MD;  Location: [facility];  Service: Orthopedics;  Laterality: Left;  . TONSILLECTOMY    . TUBAL LIGATION    . TUBAL LIGATION      POb/GynH:      OB History    Gravida  2   Para  2   Term  2   Preterm      AB      Living  2     SAB      TAB      Ectopic      Multiple      Live Births              SH:   Social History   Tobacco Use  . Smoking status: Current Every Day Smoker    Packs/day: 0.25    Years: 30.00    Pack years: 7.50    Types: Cigarettes  . Smokeless tobacco: Never Used  Substance Use Topics  . Alcohol use: No  . Drug use: No    FH:    Family History  Problem Relation Age of Onset  . Cancer Mother   . Heart failure Father   . Other Son        MVA     Allergies:  Allergies  Allergen Reactions  . Metformin Diarrhea    Medications:       Current Facility-Administered Medications:  .  ceFAZolin (ANCEF) 3 g in dextrose 5 % 50 mL  IVPB, 3 g, Intravenous, On Call to OR, Lazaro Arms, MD .  lactated ringers infusion, , Intravenous, Continuous, Nabonsal, Fara Boros, MD  Review of Systems:   Review of Systems  Constitutional: Negative for fever, chills, weight loss, malaise/fatigue and diaphoresis.  HENT: Negative for hearing loss, ear pain, nosebleeds, congestion, sore throat, neck pain, tinnitus and ear discharge.   Eyes: Negative for blurred vision, double vision, photophobia, pain, discharge and redness.  Respiratory: Negative for cough, hemoptysis, sputum production, shortness of breath, wheezing and stridor.   Cardiovascular: Negative for chest pain, palpitations, orthopnea, claudication, leg swelling and PND.  Gastrointestinal: Positive for abdominal pain. Negative for heartburn, nausea, vomiting, diarrhea, constipation, blood in stool and melena.  Genitourinary: Negative for dysuria, urgency, frequency, hematuria and flank pain.  Musculoskeletal: Negative for myalgias, back pain, joint pain and falls.  Skin: Negative for itching and rash.  Neurological:  Negative for dizziness, tingling, tremors, sensory change, speech change, focal weakness, seizures, loss of consciousness, weakness and headaches.  Endo/Heme/Allergies: Negative for environmental allergies and polydipsia. Does not bruise/bleed easily.  Psychiatric/Behavioral: Negative for depression, suicidal ideas, hallucinations, memory loss and substance abuse. The patient is not nervous/anxious and does not have insomnia.      PHYSICAL EXAM:  Blood pressure (!) 101/49, pulse 72, temperature 98.7 F (37.1 C), temperature source Oral, resp. rate 20, SpO2 93 %.    Vitals reviewed. Constitutional: She is oriented to person, place, and time. She appears well-developed and well-nourished.  HENT:  Head: Normocephalic and atraumatic.  Right Ear: External ear normal.  Left Ear: External ear normal.  Nose: Nose normal.  Mouth/Throat: Oropharynx is clear and moist.   Eyes: Conjunctivae and EOM are normal. Pupils are equal, round, and reactive to light. Right eye exhibits no discharge. Left eye exhibits no discharge. No scleral icterus.  Neck: Normal range of motion. Neck supple. No tracheal deviation present. No thyromegaly present.  Cardiovascular: Normal rate, regular rhythm, normal heart sounds and intact distal pulses.  Exam reveals no gallop and no friction rub.   No murmur heard. Respiratory: Effort normal and breath sounds normal. No respiratory distress. She has no wheezes. She has no rales. She exhibits no tenderness.  GI: Soft. Bowel sounds are normal. She exhibits no distension and no mass. There is tenderness. There is no rebound and no guarding.  Genitourinary:       Vulva is normal without lesions Vagina is pink moist without discharge Cervix normal in appearance and pap is normal Uterus is normal size, contour, position, consistency, mobility, non-tender Adnexa is negative with normal sized ovaries by sonogram  Musculoskeletal: Normal range of motion. She exhibits no edema and no tenderness.  Neurological: She is alert and oriented to person, place, and time. She has normal reflexes. She displays normal reflexes. No cranial nerve deficit. She exhibits normal muscle tone. Coordination normal.  Skin: Skin is warm and dry. No rash noted. No erythema. No pallor.  Psychiatric: She has a normal mood and affect. Her behavior is normal. Judgment and thought content normal.    Labs: Results for orders placed or performed during the hospital encounter of 04/28/18 (from the past 336 hour(s))  Glucose, capillary   Collection Time: 04/28/18  6:29 AM  Result Value Ref Range   Glucose-Capillary 122 (H) 70 - 99 mg/dL  Results for orders placed or performed during the hospital encounter of 04/22/18 (from the past 336 hour(s))  Glucose, capillary   Collection Time: 04/22/18  2:36 PM  Result Value Ref Range   Glucose-Capillary 103 (H) 70 - 99 mg/dL   CBC   Collection Time: 04/22/18  2:40 PM  Result Value Ref Range   WBC 6.8 4.0 - 10.5 K/uL   RBC 4.09 3.87 - 5.11 MIL/uL   Hemoglobin 13.5 12.0 - 15.0 g/dL   HCT 16.1 09.6 - 04.5 %   MCV 93.6 80.0 - 100.0 fL   MCH 33.0 26.0 - 34.0 pg   MCHC 35.2 30.0 - 36.0 g/dL   RDW 40.9 81.1 - 91.4 %   Platelets 175 150 - 400 K/uL   nRBC 0.0 0.0 - 0.2 %  Comprehensive metabolic panel   Collection Time: 04/22/18  2:40 PM  Result Value Ref Range   Sodium 135 135 - 145 mmol/L   Potassium 3.7 3.5 - 5.1 mmol/L   Chloride 106 98 - 111 mmol/L   CO2 20 (L) 22 -  32 mmol/L   Glucose, Bld 97 70 - 99 mg/dL   BUN 15 6 - 20 mg/dL   Creatinine, Ser 1.61 0.44 - 1.00 mg/dL   Calcium 8.9 8.9 - 09.6 mg/dL   Total Protein 7.4 6.5 - 8.1 g/dL   Albumin 3.6 3.5 - 5.0 g/dL   AST 31 15 - 41 U/L   ALT 39 0 - 44 U/L   Alkaline Phosphatase 85 38 - 126 U/L   Total Bilirubin 0.7 0.3 - 1.2 mg/dL   GFR calc non Af Amer >60 >60 mL/min   GFR calc Af Amer >60 >60 mL/min   Anion gap 9 5 - 15  hCG, quantitative, pregnancy   Collection Time: 04/22/18  2:40 PM  Result Value Ref Range   hCG, Beta Chain, Quant, S <1 <5 mIU/mL  Rapid HIV screen (HIV 1/2 Ab+Ag)   Collection Time: 04/22/18  2:40 PM  Result Value Ref Range   HIV-1 P24 Antigen - HIV24 NON REACTIVE NON REACTIVE   HIV 1/2 Antibodies NON REACTIVE NON REACTIVE   Interpretation (HIV Ag Ab)      A non reactive test result means that HIV 1 or HIV 2 antibodies and HIV 1 p24 antigen were not detected in the specimen.  Urinalysis, Routine w reflex microscopic   Collection Time: 04/22/18  2:40 PM  Result Value Ref Range   Color, Urine YELLOW YELLOW   APPearance HAZY (A) CLEAR   Specific Gravity, Urine 1.024 1.005 - 1.030   pH 5.0 5.0 - 8.0   Glucose, UA NEGATIVE NEGATIVE mg/dL   Hgb urine dipstick MODERATE (A) NEGATIVE   Bilirubin Urine NEGATIVE NEGATIVE   Ketones, ur NEGATIVE NEGATIVE mg/dL   Protein, ur NEGATIVE NEGATIVE mg/dL   Nitrite NEGATIVE NEGATIVE    Leukocytes, UA SMALL (A) NEGATIVE   RBC / HPF 0-5 0 - 5 RBC/hpf   WBC, UA 6-10 0 - 5 WBC/hpf   Bacteria, UA RARE (A) NONE SEEN   Squamous Epithelial / LPF 11-20 0 - 5   Mucus PRESENT   Type and screen   Collection Time: 04/22/18  2:40 PM  Result Value Ref Range   ABO/RH(D) B POS    Antibody Screen NEG    Sample Expiration      05/06/2018 Performed at [facility], [address] [place], [place] [address]   Hemoglobin A1c   Collection Time: 04/22/18  2:46 PM  Result Value Ref Range   Hgb A1c MFr Bld 6.6 (H) 4.8 - 5.6 %   Mean Plasma Glucose 143 mg/dL    EKG: Orders placed or performed during the hospital encounter of 10/20/13  . ED EKG  . ED EKG  . EKG    Imaging Studies: US Pelvis Transvanginal Non-ob (tv Only)  Result Date: 04/05/2018 GYNECOLOGIC SONOGRAM [name] is a 52 y.o. [id number] LMP 03/10/2018,she is here for a pelvic sonogram for abnormal uterine bleeding. Uterus                      6.5 x 4.9 x 5.7 cm, vol 102 ml, heterogeneous anteverted uterus,no measurable fibroids visualized Endometrium          21 mm, symmetrical, complexed thickened endometrium (on Megace),no color flow,no obvious polyps visualized Right ovary             1.8 x 1.3 x 2 cm, wnl Left ovary                3  x 2.3 x 3 cm, wnl No free fluid Technician Comments: PELVIC US TA/TV: heterogeneous anteverted uterus,no measurable fibroids visualized,complex thickened endometrium 21 mm (on Megace),no color flow within the endometrium,normal ovaries bilat,ovaries appear mobile,no pain during ultrasound,no free fluid [employer] 04/05/2018 11:18 AM Clinical Impression and recommendations: I have reviewed the sonogram results above, combined with the patient's current clinical course, below are my impressions and any appropriate recommendations for management based on the sonographic findings. Uterus is normal, no fibroids Endometrium is decidualized by megestrol but no pathology Ovaries are normal Lazaro Arms 04/05/2018 11:21 AM   US Pelvis (transabdominal Only)  Result Date: 04/05/2018 GYNECOLOGIC SONOGRAM [name] is a 52 y.o. [id number] LMP 03/10/2018,she is here for a pelvic sonogram for abnormal uterine bleeding. Uterus                      6.5 x 4.9 x 5.7 cm, vol 102 ml, heterogeneous anteverted uterus,no measurable fibroids visualized Endometrium          21 mm, symmetrical, complexed thickened endometrium (on Megace),no color flow,no obvious polyps visualized Right ovary             1.8 x 1.3 x 2 cm, wnl Left ovary                3 x 2.3 x 3 cm, wnl No free fluid Technician Comments: PELVIC US TA/TV: heterogeneous anteverted uterus,no measurable fibroids visualized,complex thickened endometrium 21 mm (on Megace),no color flow within the endometrium,normal ovaries bilat,ovaries appear mobile,no pain during ultrasound,no free fluid [employer] 04/05/2018 11:18 AM Clinical Impression and recommendations: I have reviewed the sonogram results above, combined with the patient's current clinical course, below are my impressions and any appropriate recommendations for management based on the sonographic findings. Uterus is normal, no fibroids Endometrium is decidualized by megestrol but no pathology Ovaries are normal Lazaro Arms 04/05/2018 11:21 AM      Assessment: Menometrorrhagia in the perimenopausal period   Patient Active Problem List   Diagnosis Date Noted  . Yeast dermatitis 12/02/2012  . Wound of right lower extremity 12/02/2012  . Chronic respiratory failure (HCC) 12/01/2012  . Obstructive sleep apnea 12/01/2012  . Severe obesity (BMI >= 40) (HCC) 12/01/2012  . Hypotension, unspecified 11/04/2012  . Acute and chronic respiratory failure 11/04/2012  . Trimalleolar fracture of ankle, closed 11/03/2012  . Obesity hypoventilation syndrome (HCC) 11/03/2012  . Hypoxia 11/03/2012  . Morbid obesity (HCC) 11/03/2012  . Essential hypertension, benign 11/03/2012  . Other and  unspecified hyperlipidemia 11/03/2012  . Unspecified hypothyroidism 11/03/2012  . Tobacco abuse 11/03/2012    Plan: Hysteroscopy uterine curettage Minerva endometrial ablation  Lazaro Arms 04/28/2018 7:08 AM

## 2018-04-28 NOTE — Anesthesia Postprocedure Evaluation (Signed)
Anesthesia Post Note  Patient: [name]  Procedure(s) Performed: DILATATION AND CURETTAGE/HYSTEROSCOPY WITH MINERVA ENDOMETRIAL ABLATION (N/A )  Patient location during evaluation: PACU Anesthesia Type: General Level of consciousness: awake and patient cooperative Pain management: pain level controlled Vital Signs Assessment: post-procedure vital signs reviewed and stable Respiratory status: spontaneous breathing, nonlabored ventilation and respiratory function stable Cardiovascular status: blood pressure returned to baseline Postop Assessment: no apparent nausea or vomiting Anesthetic complications: no     Last Vitals:  Vitals:   04/28/18 0845 04/28/18 0900  BP: (!) 104/59 104/61  Pulse: 60 63  Resp: 15 16  Temp:    SpO2: 100% 92%    Last Pain:  Vitals:   04/28/18 0900  TempSrc:   PainSc: 0-No pain                 Kumari Sculley J

## 2018-04-28 NOTE — Anesthesia Preprocedure Evaluation (Signed)
Anesthesia Evaluation  Patient identified by MRN, date of birth, ID band Patient awake    Reviewed: Allergy & Precautions, NPO status , Patient's Chart, lab work & pertinent test results  History of Anesthesia Complications (+) PONV  Airway Mallampati: II  TM Distance: >3 FB Neck ROM: Full    Dental no notable dental hx. (+) Missing, Chipped, Poor Dentition   Pulmonary shortness of breath, asthma , sleep apnea and Continuous Positive Airway Pressure Ventilation , COPD,  COPD inhaler, Current Smoker,    Pulmonary exam normal breath sounds clear to auscultation       Cardiovascular Exercise Tolerance: Good hypertension, Pt. on medications negative cardio ROS Normal cardiovascular examI Rhythm:Regular Rate:Normal     Neuro/Psych Anxiety Depression negative neurological ROS  negative psych ROS   GI/Hepatic negative GI ROS, Neg liver ROS,   Endo/Other  diabetes, Well Controlled, Type 1, Insulin DependentHypothyroidism Morbid obesity  Renal/GU negative Renal ROS  negative genitourinary   Musculoskeletal  (+) Arthritis , Osteoarthritis,    Abdominal (+) + obese,   Peds negative pediatric ROS (+)  Hematology negative hematology ROS (+)   Anesthesia Other Findings   Reproductive/Obstetrics negative OB ROS                             Anesthesia Physical Anesthesia Plan  ASA: III  Anesthesia Plan: General   Post-op Pain Management:    Induction: Intravenous  PONV Risk Score and Plan:   Airway Management Planned: Oral ETT  Additional Equipment:   Intra-op Plan:   Post-operative Plan: Extubation in OR  Informed Consent: I have reviewed the patients History and Physical, chart, labs and discussed the procedure including the risks, benefits and alternatives for the proposed anesthesia with the patient or authorized representative who has indicated his/her understanding and acceptance.    Dental advisory given  Plan Discussed with: CRNA  Anesthesia Plan Comments: (LMA vs ETT Probable ETT)        Anesthesia Quick Evaluation

## 2018-04-28 NOTE — Op Note (Signed)
Preoperative diagnosis:  1.   Perimenopausal menometrorrhagia                                         2.  dysmenorrhea   Postoperative diagnoses: Same as above   Procedure: Hysteroscopy, uterine curettage, endometrial ablation using Minerva  Surgeon: Lazaro Arms   Anesthesia: Laryngeal mask airway  Findings: The endometrium was normal. There were no fibroid or other abnormalities. Photo documented  Description of operation: The patient was taken to the operating room and placed in the supine position. She underwent general anesthesia using the laryngeal mask airway. She was placed in the dorsal lithotomy position and prepped and draped in the usual sterile fashion. A Graves speculum was placed and the anterior cervical lip was grasped with a single-tooth tenaculum. The cervix was dilated serially to allow passage of the hysteroscope. Diagnostic hysteroscopy was performed and was found to be normal. A vigorous uterine curettage was then performed and all tissue sent to pathology for evaluation.  I then proceeded to perform the Minerva endometrial ablation.   The uterus sounded to 9 cm The handpiece was attached to the Minerva power source/machine and the handpiece passed the checklist. The array was squeezed down to remove all of the air present.  The array was then place into the endometrial cavity and deployed to a length of 6 cm. The handpiece confirmed appropriate width by being in the green portion of the visual dial. The cervical cuff was then inflated to the point the CO2 indicator was in the green. The endometrial integrity check was then performed and integrity sequence was confirmed x 2. The heating was then begun and carried out for a total of 2 minutes(which is standard therapy time). When the plasma cycle was finished,  the cervical cuff was deflated and the array was removed with tissue present on the silicon membrane. There was appropriate post Minerva bleeding and uterine  discharge.     All of the equipment worked well throughout the procedure.  The patient was awakened from anesthesia and taken to the recovery room in good stable condition all counts were correct. She received 2 g of Ancef and 30 mg of Toradol preoperatively. She will be discharged from the recovery room and followed up in the office in 1- 2 weeks.   She can expect 4 weeks of post procedure bloody watery discharge  Lazaro Arms, MD  04/28/2018 8:21 AM

## 2018-04-28 NOTE — Transfer of Care (Signed)
Immediate Anesthesia Transfer of Care Note  Patient: [name]  Procedure(s) Performed: DILATATION AND CURETTAGE/HYSTEROSCOPY WITH MINERVA ENDOMETRIAL ABLATION (N/A )  Patient Location: PACU  Anesthesia Type:General  Level of Consciousness: awake and patient cooperative  Airway & Oxygen Therapy: Patient Spontanous Breathing and Patient connected to face mask oxygen  Post-op Assessment: Report given to RN, Post -op Vital signs reviewed and stable and Patient moving all extremities  Post vital signs: Reviewed and stable  Last Vitals:  Vitals Value Taken Time  BP    Temp    Pulse    Resp    SpO2      Last Pain:  Vitals:   04/28/18 0640  TempSrc: Oral  PainSc: 0-No pain      Patients Stated Pain Goal: 6 (04/28/18 0640)  Complications: No apparent anesthesia complications

## 2018-04-29 ENCOUNTER — Ambulatory Visit: Payer: Medicare Other | Admitting: Neurology

## 2018-05-06 ENCOUNTER — Ambulatory Visit (INDEPENDENT_AMBULATORY_CARE_PROVIDER_SITE_OTHER): Payer: Medicare Other | Admitting: Obstetrics & Gynecology

## 2018-05-06 ENCOUNTER — Encounter: Payer: Self-pay | Admitting: Obstetrics & Gynecology

## 2018-05-06 VITALS — BP 98/60 | HR 82 | Wt 270.0 lb

## 2018-05-06 DIAGNOSIS — Z9889 Other specified postprocedural states: Secondary | ICD-10-CM

## 2018-05-06 NOTE — Progress Notes (Signed)
  HPI: Patient returns for routine postoperative follow-up having undergone hysteroscopy uterine curettage and ablation on 04/28/2018.  The patient's immediate postoperative recovery has been unremarkable. Since hospital discharge the patient reports no problems.   Current Outpatient Medications: ALPRAZolam (XANAX) 0.5 MG tablet, Take 0.5 mg by mouth daily as needed for anxiety. , Disp: , Rfl:  aspirin EC 81 MG tablet, Take 81 mg by mouth daily., Disp: , Rfl:  atorvastatin (LIPITOR) 80 MG tablet, Take 80 mg by mouth daily. , Disp: , Rfl:  escitalopram (LEXAPRO) 20 MG tablet, Take 20 mg by mouth daily. , Disp: , Rfl:  fluticasone (FLONASE) 50 MCG/ACT nasal spray, Place 2 sprays into both nostrils daily as needed for allergies. , Disp: , Rfl:  hydrochlorothiazide (HYDRODIURIL) 12.5 MG tablet, Take 12.5 mg by mouth daily., Disp: , Rfl:  insulin degludec (TRESIBA FLEXTOUCH) 100 UNIT/ML SOPN FlexTouch Pen, Inject 40 Units into the skin daily. , Disp: , Rfl:  latanoprost (XALATAN) 0.005 % ophthalmic solution, Place 1 drop into both eyes at bedtime. , Disp: , Rfl:  levothyroxine (SYNTHROID, LEVOTHROID) 200 MCG tablet, Take 1 tablet (200 mcg total) by mouth daily before breakfast., Disp: 30 tablet, Rfl: 0 pantoprazole (PROTONIX) 20 MG tablet, Take 20 mg by mouth daily as needed for indigestion., Disp: , Rfl:  telmisartan (MICARDIS) 40 MG tablet, Take 40 mg by mouth daily., Disp: , Rfl:  HYDROcodone-acetaminophen (NORCO/VICODIN) 5-325 MG tablet, Take 1 tablet by mouth every 6 (six) hours as needed. (Patient not taking: Reported on 05/06/2018), Disp: 15 tablet, Rfl: 0  No current facility-administered medications for this visit.     Blood pressure 98/60, pulse 82, weight 270 lb (122.5 kg).  Physical Exam: Normal post ablation  Diagnostic Tests:   Pathology: Benign endometrium  Impression: S/P hysteroscopy uterine curettage Minerva ablation  Plan:   Follow up: 1  years  Lazaro Arms,  MD

## 2018-05-11 ENCOUNTER — Ambulatory Visit (HOSPITAL_COMMUNITY): Payer: Medicare Other

## 2018-05-11 ENCOUNTER — Encounter (HOSPITAL_COMMUNITY): Payer: Self-pay

## 2018-05-25 ENCOUNTER — Ambulatory Visit (HOSPITAL_COMMUNITY): Payer: Medicare Other

## 2018-05-25 ENCOUNTER — Ambulatory Visit (HOSPITAL_COMMUNITY)
Admission: RE | Admit: 2018-05-25 | Discharge: 2018-05-25 | Disposition: A | Discharge status: Home / Self Care | Payer: Medicare Other | Source: Ambulatory Visit | Attending: Nurse Practitioner | Admitting: Nurse Practitioner

## 2018-05-25 DIAGNOSIS — N6310 Unspecified lump in the right breast, unspecified quadrant: Secondary | ICD-10-CM | POA: Insufficient documentation

## 2018-07-18 ENCOUNTER — Encounter: Payer: Self-pay | Admitting: Neurology

## 2018-07-22 ENCOUNTER — Encounter: Payer: Self-pay | Admitting: Neurology

## 2018-07-22 ENCOUNTER — Ambulatory Visit (INDEPENDENT_AMBULATORY_CARE_PROVIDER_SITE_OTHER): Payer: Medicare Other | Admitting: Neurology

## 2018-07-22 VITALS — BP 110/69 | HR 74 | Ht 64.0 in | Wt 269.0 lb

## 2018-07-22 DIAGNOSIS — Z6841 Body Mass Index (BMI) 40.0 and over, adult: Secondary | ICD-10-CM

## 2018-07-22 DIAGNOSIS — G4733 Obstructive sleep apnea (adult) (pediatric): Secondary | ICD-10-CM

## 2018-07-22 DIAGNOSIS — Z9989 Dependence on other enabling machines and devices: Secondary | ICD-10-CM

## 2018-07-22 DIAGNOSIS — Z91199 Patient's noncompliance with other medical treatment and regimen due to unspecified reason: Secondary | ICD-10-CM

## 2018-07-22 DIAGNOSIS — Z9114 Patient's other noncompliance with medication regimen: Secondary | ICD-10-CM | POA: Diagnosis not present

## 2018-07-22 DIAGNOSIS — E66813 Obesity, class 3: Secondary | ICD-10-CM

## 2018-07-22 DIAGNOSIS — E11649 Type 2 diabetes mellitus with hypoglycemia without coma: Secondary | ICD-10-CM | POA: Insufficient documentation

## 2018-07-22 DIAGNOSIS — I10 Essential (primary) hypertension: Secondary | ICD-10-CM | POA: Diagnosis not present

## 2018-07-22 DIAGNOSIS — J449 Chronic obstructive pulmonary disease, unspecified: Secondary | ICD-10-CM
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
## 2018-09-13 ENCOUNTER — Telehealth: Payer: Self-pay | Admitting: Neurology

## 2018-09-13 ENCOUNTER — Encounter: Payer: Self-pay | Admitting: Neurology

## 2018-09-13 NOTE — Telephone Encounter (Signed)
Hi. Dr. Vickey Huger,  Thank you so much for your recent referral to our smoking cessation program, [facility]. I am the facilitator for the classes held at [facility].  The best way for the patient to register for the class is to register online through the [facility] website. I am going to call Ms. Fiume now and walk her through the process of registering.  I am also going to deliver some of our [employer] to your office. That way, if you wish to refer patients in the future, you can give them a flyer that will direct them how to register.  Thanks again, and please let me know if you have any questions.   Leia Alf, MHA, OTR/L  Site Manager [facility]  [phone]

## 2018-10-04 ENCOUNTER — Other Ambulatory Visit: Payer: Self-pay | Admitting: Obstetrics & Gynecology

## 2018-12-28 ENCOUNTER — Other Ambulatory Visit: Payer: Self-pay | Admitting: Obstetrics & Gynecology

## 2019-07-19 ENCOUNTER — Other Ambulatory Visit (HOSPITAL_COMMUNITY): Payer: Self-pay | Admitting: Family Medicine

## 2019-07-19 DIAGNOSIS — R928 Other abnormal and inconclusive findings on diagnostic imaging of breast: Secondary | ICD-10-CM

## 2019-07-24 ENCOUNTER — Encounter: Payer: Self-pay | Admitting: Neurology

## 2019-07-26 ENCOUNTER — Encounter: Payer: Self-pay | Admitting: Neurology

## 2019-07-26 ENCOUNTER — Other Ambulatory Visit: Payer: Self-pay

## 2019-07-26 ENCOUNTER — Ambulatory Visit (INDEPENDENT_AMBULATORY_CARE_PROVIDER_SITE_OTHER): Payer: Medicare Other | Admitting: Neurology

## 2019-07-26 VITALS — BP 127/80 | HR 85 | Temp 97.4°F | Ht 64.0 in | Wt 305.0 lb

## 2019-07-26 DIAGNOSIS — G4733 Obstructive sleep apnea (adult) (pediatric): Secondary | ICD-10-CM

## 2019-07-26 DIAGNOSIS — J9611 Chronic respiratory failure with hypoxia: Secondary | ICD-10-CM

## 2019-07-26 DIAGNOSIS — J449 Chronic obstructive pulmonary disease, unspecified: Secondary | ICD-10-CM

## 2019-07-26 DIAGNOSIS — S82853D Displaced trimalleolar fracture of unspecified lower leg, subsequent encounter for closed fracture with routine healing: Secondary | ICD-10-CM

## 2019-07-26 DIAGNOSIS — E662 Morbid (severe) obesity with alveolar hypoventilation: Secondary | ICD-10-CM

## 2019-07-26 DIAGNOSIS — J9612 Chronic respiratory failure with hypercapnia: Secondary | ICD-10-CM

## 2019-07-26 DIAGNOSIS — Z720 Tobacco use: Secondary | ICD-10-CM
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
## 2019-07-26 NOTE — Patient Instructions (Signed)
Obesity Hypoventilation Syndrome  Obesity hypoventilation syndrome (OHS) means that you are not breathing well enough to get air in and out of your lungs efficiently (ventilation). This causes a low oxygen level and a high carbon dioxide level in your blood (hypoventilation). Having too much total body fat (obesity) is a significant risk factor for developing OHS. OHS makes it harder for your heart to pump oxygen-rich blood to your body. It can cause sleep disturbances and make you feel sleepy during the day. Over time, OHS can increase your risk for:  Heart disease.  High blood pressure (hypertension).  Reduced ability to absorb sugar from the bloodstream (insulin resistance).  Heart failure. Over time, OHS weakens your heart and can lead to heart failure. What are the causes? The exact cause of OHS is not known. Possible causes include:  Pressure on the lungs from excess body weight.  Obesity-related changes in how much air the lungs can hold (lung capacity) and how much they can expand (lung compliance).  Failure of the brain to regulate oxygen and carbon dioxide levels properly.  Chemicals (hormones) produced by excess fat cells interfering with breathing regulation.  A breathing condition in which breathing pauses or becomes shallow during sleep (sleep apnea). This condition can eventually cause the body to ventilate poorly and to hold onto carbon dioxide during the day. What increases the risk? You may have a greater risk for OHS if you:  Have a BMI of 30 or higher. BMI is an estimate of body fat that is calculated from height and weight. For adults, a BMI of 30 or higher is considered obese.  Are 40?56 years old.  Carry most of your excess weight around your waist.  Experience moderate symptoms of sleep apnea. What are the signs or symptoms? The most common symptoms of OHS are:  Daytime sleepiness.  Lack of energy.  Shortness of breath.  Morning headaches.  Sleep  apnea.  Trouble concentrating.  Irritability, mood swings, or depression.  Swollen veins in the neck.  Swelling of the legs. How is this diagnosed? Your health care provider may suspect OHS if you are obese and have poor breathing during the day and at night. Your health care provider will also do a physical exam. You may have tests to:  Measure your BMI.  Measure your blood oxygen level with a sensor placed on your finger (pulse oximetry).  Measure blood oxygen and carbon dioxide in a blood sample.  Measure the amount of red blood cells in a blood sample. OHS causes the number of red blood cells you have to increase (polycythemia).  Check your breathing ability (pulmonary function testing).  Check your breathing ability, breathing patterns, and oxygen level while you sleep (sleep study). You may also have a chest X-ray to rule out other breathing problems. You may have an electrocardiogram (ECG) and or echocardiogram to check for signs of heart failure. How is this treated? Weight loss is the most important part of treatment for OHS, and it may be the only treatment that you need. Other treatments may include:  Using a device to open your airway while you sleep, such as a continuous positive airway pressure (CPAP) machine that delivers oxygen to your airway through a mask.  Surgery (gastric bypass surgery) to lower your BMI. This may be needed if: ? You are very obese. ? Other treatments have not worked for you. ? Your OHS is very severe and is causing organ damage, such as heart failure. Follow these   instructions at home:  Medicines  Take over-the-counter and prescription medicines only as told by your health care provider.  Ask your health care provider what medicines are safe for you. You may be told to avoid medicines that can impair breathing and make OHS worse, such as sedatives and narcotics. Sleeping habits  If you are prescribed a CPAP machine, make sure you  understand and use the machine as directed.  Try to get 8 hours of sleep every night.  Go to bed at the same time every night, and get up at the same time every day. General instructions  Work with your health care provider to make a diet and exercise plan that helps you reach and maintain a healthy weight.  Eat a healthy diet.  Avoid smoking.  Exercise regularly as told by your health care provider.  During the evening, do not drink caffeine and do not eat heavy meals.  Keep all follow-up visits as told by your health care provider. This is important. Contact a health care provider if:  You experience new or worsening shortness of breath.  You have chest pain.  You have an irregular heartbeat (palpitations).  You have dizziness.  You faint.  You develop a cough.  You have a fever.  You have chest pain when you breathe (pleurisy). This information is not intended to replace advice given to you by your health care provider. Make sure you discuss any questions you have with your health care provider. Document Revised: 10/08/2018 Document Reviewed: 11/26/2015 Elsevier Patient Education  2020 Elsevier Inc.  

## 2019-08-02 ENCOUNTER — Other Ambulatory Visit (HOSPITAL_COMMUNITY): Payer: Self-pay | Admitting: Family Medicine

## 2019-08-11 ENCOUNTER — Other Ambulatory Visit (HOSPITAL_COMMUNITY): Payer: Self-pay | Admitting: Family Medicine

## 2019-08-15 ENCOUNTER — Other Ambulatory Visit (HOSPITAL_COMMUNITY): Payer: Self-pay | Admitting: Nurse Practitioner

## 2019-08-15 DIAGNOSIS — R928 Other abnormal and inconclusive findings on diagnostic imaging of breast: Secondary | ICD-10-CM

## 2019-08-30 ENCOUNTER — Ambulatory Visit (HOSPITAL_COMMUNITY): Payer: Medicare Other

## 2019-08-30 ENCOUNTER — Ambulatory Visit (HOSPITAL_COMMUNITY): Admission: RE | Admit: 2019-08-30 | Payer: Medicare Other | Source: Ambulatory Visit

## 2019-08-30 ENCOUNTER — Encounter (HOSPITAL_COMMUNITY): Payer: Medicare Other

## 2019-08-30 ENCOUNTER — Encounter (HOSPITAL_COMMUNITY): Payer: Self-pay

## 2019-09-05 ENCOUNTER — Ambulatory Visit (HOSPITAL_COMMUNITY): Payer: Self-pay | Admitting: Psychiatry

## 2019-09-08 ENCOUNTER — Other Ambulatory Visit: Payer: Self-pay

## 2019-09-08 ENCOUNTER — Encounter (HOSPITAL_COMMUNITY): Payer: Self-pay | Admitting: Psychiatry

## 2019-09-08 ENCOUNTER — Ambulatory Visit (INDEPENDENT_AMBULATORY_CARE_PROVIDER_SITE_OTHER): Payer: Medicare Other | Admitting: Psychiatry

## 2019-09-08 DIAGNOSIS — F331 Major depressive disorder, recurrent, moderate: Secondary | ICD-10-CM
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
## 2019-09-20 ENCOUNTER — Ambulatory Visit (HOSPITAL_COMMUNITY)
Admission: RE | Admit: 2019-09-20 | Discharge: 2019-09-20 | Disposition: A | Discharge status: Home / Self Care | Payer: Medicare Other | Source: Ambulatory Visit | Attending: Nurse Practitioner | Admitting: Nurse Practitioner

## 2019-09-20 ENCOUNTER — Other Ambulatory Visit: Payer: Self-pay

## 2019-09-20 DIAGNOSIS — R928 Other abnormal and inconclusive findings on diagnostic imaging of breast: Secondary | ICD-10-CM | POA: Insufficient documentation

## 2019-09-26 ENCOUNTER — Ambulatory Visit (INDEPENDENT_AMBULATORY_CARE_PROVIDER_SITE_OTHER): Payer: Medicare Other | Admitting: Psychiatry

## 2019-09-26 ENCOUNTER — Encounter (HOSPITAL_COMMUNITY): Payer: Self-pay | Admitting: Psychiatry

## 2019-09-26 ENCOUNTER — Other Ambulatory Visit: Payer: Self-pay

## 2019-09-26 DIAGNOSIS — F331 Major depressive disorder, recurrent, moderate: Secondary | ICD-10-CM
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
## 2019-10-17 ENCOUNTER — Ambulatory Visit (INDEPENDENT_AMBULATORY_CARE_PROVIDER_SITE_OTHER): Payer: Medicare Other | Admitting: Psychiatry

## 2019-10-17 ENCOUNTER — Other Ambulatory Visit: Payer: Self-pay

## 2019-10-17 ENCOUNTER — Encounter (HOSPITAL_COMMUNITY): Payer: Self-pay | Admitting: Psychiatry

## 2019-10-17 DIAGNOSIS — F331 Major depressive disorder, recurrent, moderate: Secondary | ICD-10-CM
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
## 2019-10-27 ENCOUNTER — Encounter: Payer: Self-pay | Admitting: Adult Health

## 2019-10-27 ENCOUNTER — Ambulatory Visit (INDEPENDENT_AMBULATORY_CARE_PROVIDER_SITE_OTHER): Payer: Medicare Other | Admitting: Adult Health

## 2019-10-27 ENCOUNTER — Other Ambulatory Visit: Payer: Self-pay

## 2019-10-27 VITALS — BP 132/84 | HR 84 | Temp 98.1°F | Ht 64.0 in | Wt 290.0 lb

## 2019-10-27 DIAGNOSIS — G4733 Obstructive sleep apnea (adult) (pediatric): Secondary | ICD-10-CM | POA: Diagnosis not present

## 2019-10-27 DIAGNOSIS — J449 Chronic obstructive pulmonary disease, unspecified: Secondary | ICD-10-CM | POA: Diagnosis not present

## 2019-10-27 DIAGNOSIS — Z9989 Dependence on other enabling machines and devices: Secondary | ICD-10-CM

## 2019-10-27 NOTE — Patient Instructions (Signed)
Continue using CPAP nightly and greater than 4 hours each night °If your symptoms worsen or you develop new symptoms please let us know.  ° °

## 2019-10-27 NOTE — Progress Notes (Signed)
PATIENT: [name] DOB: October 04, 1965  REASON FOR VISIT: follow up HISTORY FROM: patient  HISTORY OF PRESENT ILLNESS: Today 10/27/19: [name] is a 54 year old female with a history of obstructive sleep apnea on CPAP.  Her download indicates that she use her machine nightly for compliance of 100%.  She used her machine greater than 4 hours 24 days for compliance of 80%.  On average she uses her machine 5 hours and 11 minutes.  Her residual AHI is 1.5 on 14 cm of water with EPR 3.  Leak in the 95th percentile is 13.2 L/min she reports that the CPAP Pap works well for her.  She does not like to sleep without it.  HISTORY (Copied from Dr.Dohmeier's note)  07-25-2018, [name] is a mean by 54 year old Caucasian female patient who has been on CPAP for several years now.  She has used her machine 20 out of 30 days but she struggles with using it over 3 hours daily.  Her average just felt short at 3 hours and 47 minutes.  The set pressure is 12 cmH2O with 3 cm EPR and the residual AHI is acceptable at 6.6/h.  She does not have major air leakage, her mask seems to fit very well but she does have nocturnal coughing and discomfort from using the CPAP through the night- dry nose , dry mouth. She is no longer on 02 at night.  She still sleeps with her dog in the same bed.  We are discussing to change the humidifier settings and to use a weighted blanket. [employer].   REVIEW OF SYSTEMS: Out of a complete 14 system review of symptoms, the patient complains only of the following symptoms, and all other reviewed systems are negative.  FSS 34 ESS 11  ALLERGIES: Allergies  Allergen Reactions  . Metformin Diarrhea    HOME MEDICATIONS: Outpatient Medications Prior to Visit  Medication Sig Dispense Refill  . ALPRAZolam (XANAX) 0.5 MG tablet Take 0.5 mg by mouth daily as needed for anxiety.     Marland Kitchen aspirin EC 81 MG tablet Take 81 mg by mouth daily.    Marland Kitchen atorvastatin (LIPITOR) 80 MG  tablet Take 80 mg by mouth daily.     Marland Kitchen buPROPion (WELLBUTRIN) 75 MG tablet Take 75 mg by mouth 2 (two) times daily.    Marland Kitchen escitalopram (LEXAPRO) 20 MG tablet Take 20 mg by mouth daily.     . hydrochlorothiazide (HYDRODIURIL) 12.5 MG tablet Take 12.5 mg by mouth daily.    . insulin degludec (TRESIBA FLEXTOUCH) 100 UNIT/ML SOPN FlexTouch Pen Inject 40 Units into the skin daily.     Marland Kitchen latanoprost (XALATAN) 0.005 % ophthalmic solution Place 1 drop into both eyes at bedtime.     Marland Kitchen levothyroxine (SYNTHROID, LEVOTHROID) 200 MCG tablet Take 1 tablet (200 mcg total) by mouth daily before breakfast. 30 tablet 0  . megestrol (MEGACE) 40 MG tablet Take 1 tablet (40 mg total) by mouth daily. 3 tablets a day for 5 days, 2 tablets a day for 5 days then 1 tablet daily 45 tablet 3  . telmisartan (MICARDIS) 40 MG tablet Take 40 mg by mouth daily.     No facility-administered medications prior to visit.    PAST MEDICAL HISTORY: Past Medical History:  Diagnosis Date  . Anxiety   . Arthritis   . Asthma   . Blind   . Chronic respiratory failure (HCC)   . COPD (chronic obstructive pulmonary disease) (HCC)   .  Depression   . Diabetes mellitus without complication ([place])   . Glaucoma   . Hyperlipidemia   . Hypertension   . Hypothyroidism   . PONV (postoperative nausea and vomiting)   . Shortness of breath   . Sleep apnea    12  . Thyroid disease     PAST SURGICAL HISTORY: Past Surgical History:  Procedure Laterality Date  . ANKLE SURGERY Right 2007  . ENDOMETRIAL ABLATION    . ORIF ANKLE FRACTURE Left 12/01/2012   Procedure: OPEN REDUCTION INTERNAL FIXATION (ORIF) ANKLE FRACTURE;  Surgeon: Sanjuana Kava, MD;  Location: [facility];  Service: Orthopedics;  Laterality: Left;  . TONSILLECTOMY    . TUBAL LIGATION    . TUBAL LIGATION      FAMILY HISTORY: Family History  Problem Relation Age of Onset  . Cancer Mother   . Heart failure Father   . Other Son        MVA    SOCIAL HISTORY: Social  History   Socioeconomic History  . Marital status: Widowed    Spouse name: Not on file  . Number of children: 2  . Years of education: 39  . Highest education level: Not on file  Occupational History  . Occupation: N/A  Tobacco Use  . Smoking status: Current Every Day Smoker    Packs/day: 0.50    Years: 39.00    Pack years: 19.50    Types: Cigarettes  . Smokeless tobacco: Never Used  Substance and Sexual Activity  . Alcohol use: No  . Drug use: No  . Sexual activity: Not Currently    Birth control/protection: Surgical    Comment: tubal  Other Topics Concern  . Not on file  Social History Narrative   Lives at home w/ roommates   Right-handed   Caffeine: occasional coffee   Social Determinants of Health   Financial Resource Strain:   . Difficulty of Paying Living Expenses:   Food Insecurity:   . Worried About Charity fundraiser in the Last Year:   . Arboriculturist in the Last Year:   Transportation Needs:   . Film/video editor (Medical):   Marland Kitchen Lack of Transportation (Non-Medical):   Physical Activity:   . Days of Exercise per Week:   . Minutes of Exercise per Session:   Stress:   . Feeling of Stress :   Social Connections:   . Frequency of Communication with Friends and Family:   . Frequency of Social Gatherings with Friends and Family:   . Attends Religious Services:   . Active Member of Clubs or Organizations:   . Attends Archivist Meetings:   Marland Kitchen Marital Status:   Intimate Partner Violence:   . Fear of Current or Ex-Partner:   . Emotionally Abused:   Marland Kitchen Physically Abused:   . Sexually Abused:       PHYSICAL EXAM  Vitals:   10/27/19 1300  BP: 132/84  Pulse: 84  Temp: 98.1 F (36.7 C)  Weight: 290 lb (131.5 kg)  Height: 5\' 4"  (1.626 m)   Body mass index is 49.78 kg/m.  Generalized: Well developed, in no acute distress  Chest: Lungs clear to auscultation bilaterally  Neurological examination  Mentation: Alert oriented to time,  place, history taking. Follows all commands speech and language fluent Cranial nerve II-XII: Extraocular movements were full, visual field were full on confrontational test Head turning and shoulder shrug  were normal and symmetric. Motor: The motor testing reveals 5  over 5 strength of all 4 extremities. Good symmetric motor tone is noted throughout.  Sensory: Sensory testing is intact to soft touch on all 4 extremities. No evidence of extinction is noted.  Gait and station: Gait is normal.    DIAGNOSTIC DATA (LABS, IMAGING, TESTING) - I reviewed patient records, labs, notes, testing and imaging myself where available.  Lab Results  Component Value Date   WBC 6.8 04/22/2018   HGB 13.5 04/22/2018   HCT 38.3 04/22/2018   MCV 93.6 04/22/2018   PLT 175 04/22/2018      Component Value Date/Time   NA 135 04/22/2018 1440   K 3.7 04/22/2018 1440   CL 106 04/22/2018 1440   CO2 20 (L) 04/22/2018 1440   GLUCOSE 97 04/22/2018 1440   BUN 15 04/22/2018 1440   CREATININE 0.68 04/22/2018 1440   CALCIUM 8.9 04/22/2018 1440   PROT 7.4 04/22/2018 1440   ALBUMIN 3.6 04/22/2018 1440   AST 31 04/22/2018 1440   ALT 39 04/22/2018 1440   ALKPHOS 85 04/22/2018 1440   BILITOT 0.7 04/22/2018 1440   GFRNONAA >60 04/22/2018 1440   GFRAA >60 04/22/2018 1440   No results found for: CHOL, HDL, LDLCALC, LDLDIRECT, TRIG, CHOLHDL Lab Results  Component Value Date   HGBA1C 6.6 (H) 04/22/2018   No results found for: VITAMINB12 Lab Results  Component Value Date   TSH 3.472 12/01/2012      ASSESSMENT AND PLAN 54 y.o. year old female  has a past medical history of Anxiety, Arthritis, Asthma, Blind, Chronic respiratory failure (HCC), COPD (chronic obstructive pulmonary disease) (HCC), Depression, Diabetes mellitus without complication (HCC), Glaucoma, Hyperlipidemia, Hypertension, Hypothyroidism, PONV (postoperative nausea and vomiting), Shortness of breath, Sleep apnea, and Thyroid disease. here  with:  1. OSA on CPAP  - CPAP compliance excellent - Good treatment of AHI  - Encourage patient to use CPAP nightly and > 4 hours each night - F/U in 1 year or sooner if needed   I spent 25 minutes of face-to-face and non-face-to-face time with patient.  This included previsit chart review, lab review, study review, order entry, electronic health record documentation, patient education.  Butch Penny, MSN, NP-C 10/27/2019, 1:10 PM [facility] [address], [address] [place], [place] [address] [phone]

## 2019-10-31 ENCOUNTER — Telehealth (INDEPENDENT_AMBULATORY_CARE_PROVIDER_SITE_OTHER): Payer: Medicare Other | Admitting: Psychiatry

## 2019-10-31 ENCOUNTER — Other Ambulatory Visit: Payer: Self-pay

## 2019-10-31 DIAGNOSIS — F331 Major depressive disorder, recurrent, moderate: Secondary | ICD-10-CM | POA: Diagnosis not present
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
## 2019-11-14 ENCOUNTER — Ambulatory Visit (HOSPITAL_COMMUNITY): Payer: Medicare Other | Admitting: Psychiatry

## 2019-11-16 ENCOUNTER — Ambulatory Visit (HOSPITAL_COMMUNITY): Payer: Medicare Other | Admitting: Psychiatry

## 2019-11-29 ENCOUNTER — Ambulatory Visit (INDEPENDENT_AMBULATORY_CARE_PROVIDER_SITE_OTHER): Payer: Medicare Other | Admitting: Psychiatry

## 2019-11-29 ENCOUNTER — Other Ambulatory Visit: Payer: Self-pay

## 2019-11-29 DIAGNOSIS — F331 Major depressive disorder, recurrent, moderate: Secondary | ICD-10-CM | POA: Diagnosis not present
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
## 2019-12-13 ENCOUNTER — Other Ambulatory Visit: Payer: Self-pay

## 2019-12-13 ENCOUNTER — Telehealth (HOSPITAL_COMMUNITY): Payer: Self-pay | Admitting: Psychiatry

## 2019-12-13 ENCOUNTER — Ambulatory Visit (HOSPITAL_COMMUNITY): Payer: Medicare Other | Admitting: Psychiatry

## 2019-12-13 NOTE — Telephone Encounter (Signed)
Therapist attempted to contact patient twice via text through caregility platform for scheduled appointment, no response.  Therapist called patient who indicated she is on her way out of town to the beach and canceled today's appointment.  Therapist reminded patient of attendance policy.

## 2019-12-27 ENCOUNTER — Ambulatory Visit (INDEPENDENT_AMBULATORY_CARE_PROVIDER_SITE_OTHER): Payer: Medicare Other | Admitting: Psychiatry

## 2019-12-27 ENCOUNTER — Other Ambulatory Visit: Payer: Self-pay

## 2019-12-27 DIAGNOSIS — F331 Major depressive disorder, recurrent, moderate: Secondary | ICD-10-CM | POA: Diagnosis not present
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
## 2020-01-10 ENCOUNTER — Ambulatory Visit (HOSPITAL_COMMUNITY): Payer: Medicare Other | Admitting: Psychiatry

## 2020-01-24 ENCOUNTER — Ambulatory Visit (HOSPITAL_COMMUNITY): Payer: Medicare Other | Admitting: Psychiatry

## 2020-01-30 ENCOUNTER — Ambulatory Visit (INDEPENDENT_AMBULATORY_CARE_PROVIDER_SITE_OTHER): Payer: Medicare Other | Admitting: Psychiatry

## 2020-01-30 ENCOUNTER — Other Ambulatory Visit: Payer: Self-pay

## 2020-01-30 DIAGNOSIS — F331 Major depressive disorder, recurrent, moderate: Secondary | ICD-10-CM | POA: Diagnosis not present
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
## 2020-02-07 ENCOUNTER — Ambulatory Visit (HOSPITAL_COMMUNITY): Payer: Medicare Other | Admitting: Psychiatry

## 2020-02-08 ENCOUNTER — Telehealth (HOSPITAL_COMMUNITY): Payer: Medicare Other | Admitting: Psychiatry

## 2020-02-13 ENCOUNTER — Ambulatory Visit (INDEPENDENT_AMBULATORY_CARE_PROVIDER_SITE_OTHER): Payer: Medicare Other | Admitting: Psychiatry

## 2020-02-13 ENCOUNTER — Other Ambulatory Visit: Payer: Self-pay

## 2020-02-13 DIAGNOSIS — F331 Major depressive disorder, recurrent, moderate: Secondary | ICD-10-CM | POA: Diagnosis not present
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
## 2020-02-20 ENCOUNTER — Other Ambulatory Visit: Payer: Self-pay

## 2020-02-20 ENCOUNTER — Encounter (HOSPITAL_COMMUNITY): Payer: Self-pay | Admitting: Psychiatry

## 2020-02-20 ENCOUNTER — Telehealth (INDEPENDENT_AMBULATORY_CARE_PROVIDER_SITE_OTHER): Payer: Medicare Other | Admitting: Psychiatry

## 2020-02-20 DIAGNOSIS — F4310 Post-traumatic stress disorder, unspecified: Secondary | ICD-10-CM | POA: Diagnosis not present

## 2020-02-20 DIAGNOSIS — F331 Major depressive disorder, recurrent, moderate: Secondary | ICD-10-CM

## 2020-02-20 MED ORDER — SERTRALINE HCL 50 MG PO TABS: ORAL_TABLET | ORAL | 1 refills | Status: DC

## 2020-02-20 NOTE — Patient Instructions (Signed)
1. Start sertraline 25 mg daily for one week, then 50 mg daily  2. Next appointment: 10/4 at 11:30  CONTACT INFORMATION  What to do if you need to get in touch with someone regarding a psychiatric issue:  1. EMERGENCY: For psychiatric emergencies (if you are suicidal or if there are any other safety issues) call 911 and/or go to your nearest Emergency Room immediately.   2. IF YOU NEED SOMEONE TO TALK TO RIGHT NOW: Given my clinical responsibilities, I may not be able to speak with you over the phone for a prolonged period of time.  a. You may always call The National Suicide Prevention Lifeline at 1-800-273-TALK 5675410999).  b. Your county of residence will also have local crisis services. For [facility]: [facility] at [phone] (24 Hour Crisis Hotline)

## 2020-02-27 ENCOUNTER — Other Ambulatory Visit: Payer: Self-pay

## 2020-02-27 ENCOUNTER — Ambulatory Visit (INDEPENDENT_AMBULATORY_CARE_PROVIDER_SITE_OTHER): Payer: Medicare Other | Admitting: Psychiatry

## 2020-02-27 DIAGNOSIS — F331 Major depressive disorder, recurrent, moderate: Secondary | ICD-10-CM | POA: Diagnosis not present

## 2020-02-27 DIAGNOSIS — F4310 Post-traumatic stress disorder, unspecified: Secondary | ICD-10-CM | POA: Diagnosis not present
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
## 2020-03-15 ENCOUNTER — Ambulatory Visit (HOSPITAL_COMMUNITY): Payer: Medicare Other | Admitting: Psychiatry

## 2020-03-15 ENCOUNTER — Other Ambulatory Visit: Payer: Self-pay

## 2020-03-20 ENCOUNTER — Other Ambulatory Visit (HOSPITAL_COMMUNITY): Payer: Self-pay | Admitting: Psychiatry

## 2020-03-20 MED ORDER — SERTRALINE HCL 50 MG PO TABS: 50.0000 mg | ORAL_TABLET | Freq: Every day | ORAL | 1 refills | Status: DC

## 2020-03-29 ENCOUNTER — Ambulatory Visit (INDEPENDENT_AMBULATORY_CARE_PROVIDER_SITE_OTHER): Payer: Medicare Other | Admitting: Psychiatry

## 2020-03-29 ENCOUNTER — Other Ambulatory Visit: Payer: Self-pay

## 2020-03-29 DIAGNOSIS — F4310 Post-traumatic stress disorder, unspecified: Secondary | ICD-10-CM

## 2020-03-29 DIAGNOSIS — F331 Major depressive disorder, recurrent, moderate: Secondary | ICD-10-CM | POA: Diagnosis not present
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
## 2020-04-02 ENCOUNTER — Encounter (HOSPITAL_COMMUNITY): Payer: Self-pay | Admitting: Psychiatry

## 2020-04-02 ENCOUNTER — Telehealth (INDEPENDENT_AMBULATORY_CARE_PROVIDER_SITE_OTHER): Payer: Medicare Other | Admitting: Psychiatry

## 2020-04-02 ENCOUNTER — Other Ambulatory Visit: Payer: Self-pay

## 2020-04-02 DIAGNOSIS — F331 Major depressive disorder, recurrent, moderate: Secondary | ICD-10-CM

## 2020-04-02 DIAGNOSIS — F4310 Post-traumatic stress disorder, unspecified: Secondary | ICD-10-CM | POA: Diagnosis not present

## 2020-04-02 MED ORDER — SERTRALINE HCL 100 MG PO TABS: 100.0000 mg | ORAL_TABLET | Freq: Every day | ORAL | 1 refills | Status: DC

## 2020-04-02 NOTE — Patient Instructions (Signed)
1. Increase sertraline 100 mg daily  2. Next appointment: 11.15 at 11 AM

## 2020-04-13 ENCOUNTER — Other Ambulatory Visit: Payer: Self-pay

## 2020-04-13 ENCOUNTER — Ambulatory Visit (INDEPENDENT_AMBULATORY_CARE_PROVIDER_SITE_OTHER): Payer: Medicare Other | Admitting: Psychiatry

## 2020-04-13 DIAGNOSIS — F4310 Post-traumatic stress disorder, unspecified: Secondary | ICD-10-CM

## 2020-04-13 DIAGNOSIS — F331 Major depressive disorder, recurrent, moderate: Secondary | ICD-10-CM
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
## 2020-04-27 ENCOUNTER — Ambulatory Visit (INDEPENDENT_AMBULATORY_CARE_PROVIDER_SITE_OTHER): Payer: Medicare Other | Admitting: Psychiatry

## 2020-04-27 ENCOUNTER — Other Ambulatory Visit: Payer: Self-pay

## 2020-04-27 DIAGNOSIS — F331 Major depressive disorder, recurrent, moderate: Secondary | ICD-10-CM

## 2020-04-27 DIAGNOSIS — F4310 Post-traumatic stress disorder, unspecified: Secondary | ICD-10-CM | POA: Diagnosis not present
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
## 2020-05-11 ENCOUNTER — Other Ambulatory Visit: Payer: Self-pay

## 2020-05-11 ENCOUNTER — Ambulatory Visit (INDEPENDENT_AMBULATORY_CARE_PROVIDER_SITE_OTHER): Payer: Medicare Other | Admitting: Psychiatry

## 2020-05-11 DIAGNOSIS — F331 Major depressive disorder, recurrent, moderate: Secondary | ICD-10-CM | POA: Diagnosis not present

## 2020-05-11 DIAGNOSIS — F4310 Post-traumatic stress disorder, unspecified: Secondary | ICD-10-CM
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
## 2020-05-14 ENCOUNTER — Other Ambulatory Visit: Payer: Self-pay

## 2020-05-14 ENCOUNTER — Telehealth (HOSPITAL_COMMUNITY): Payer: Medicare Other | Admitting: Psychiatry

## 2020-05-14 ENCOUNTER — Telehealth: Payer: Medicare Other | Admitting: Psychiatry
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
## 2020-05-17 ENCOUNTER — Telehealth (INDEPENDENT_AMBULATORY_CARE_PROVIDER_SITE_OTHER): Payer: Medicare Other | Admitting: Psychiatry

## 2020-05-17 ENCOUNTER — Other Ambulatory Visit: Payer: Self-pay

## 2020-05-17 ENCOUNTER — Encounter: Payer: Self-pay | Admitting: Psychiatry

## 2020-05-17 DIAGNOSIS — F4310 Post-traumatic stress disorder, unspecified: Secondary | ICD-10-CM

## 2020-05-17 DIAGNOSIS — F331 Major depressive disorder, recurrent, moderate: Secondary | ICD-10-CM | POA: Diagnosis not present

## 2020-05-17 MED ORDER — SERTRALINE HCL 100 MG PO TABS: 150.0000 mg | ORAL_TABLET | Freq: Every day | ORAL | 1 refills | Status: DC

## 2020-06-07 ENCOUNTER — Other Ambulatory Visit: Payer: Self-pay

## 2020-06-07 ENCOUNTER — Ambulatory Visit (INDEPENDENT_AMBULATORY_CARE_PROVIDER_SITE_OTHER): Payer: Medicare Other | Admitting: Psychiatry

## 2020-06-07 DIAGNOSIS — F4310 Post-traumatic stress disorder, unspecified: Secondary | ICD-10-CM

## 2020-06-07 DIAGNOSIS — F331 Major depressive disorder, recurrent, moderate: Secondary | ICD-10-CM | POA: Diagnosis not present
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
## 2020-06-12 ENCOUNTER — Telehealth: Payer: Self-pay

## 2020-06-12 NOTE — Telephone Encounter (Signed)
   received a fax requesting a 90 day supply of the sertraline  sertraline (ZOLOFT) 100 MG tablet Medication Date: 05/17/2020 Department: [facility] Ordering/Authorizing: Neysa Hotter, MD    Order Providers  Prescribing Provider Encounter Provider  Neysa Hotter, MD Neysa Hotter, MD   Outpatient Medication Detail   Disp Refills Start End   sertraline (ZOLOFT) 100 MG tablet 45 tablet 1 05/17/2020    Sig - Route: Take 1.5 tablets (150 mg total) by mouth daily. - Oral   Sent to pharmacy as: sertraline (ZOLOFT) 100 MG tablet   Notes to Pharmacy: Dose uptitrated   E-Prescribing Status: Receipt confirmed by pharmacy (05/17/2020 2:35 PM EST)    Pharmacy  [id number] - [place], [place] - [address] AT [facility]

## 2020-06-12 NOTE — Telephone Encounter (Signed)
Decline as we may change the dose.

## 2020-06-19 DIAGNOSIS — Z0289 Encounter for other administrative examinations: Secondary | ICD-10-CM

## 2020-06-21 ENCOUNTER — Ambulatory Visit (HOSPITAL_COMMUNITY): Payer: Medicare Other | Admitting: Psychiatry
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
## 2020-06-28 ENCOUNTER — Ambulatory Visit (HOSPITAL_COMMUNITY): Payer: Medicare Other | Admitting: Psychiatry

## 2020-07-05 ENCOUNTER — Encounter: Payer: Self-pay | Admitting: Psychiatry

## 2020-07-05 ENCOUNTER — Other Ambulatory Visit: Payer: Self-pay

## 2020-07-05 ENCOUNTER — Telehealth (INDEPENDENT_AMBULATORY_CARE_PROVIDER_SITE_OTHER): Payer: Medicare Other | Admitting: Psychiatry

## 2020-07-05 DIAGNOSIS — F4310 Post-traumatic stress disorder, unspecified: Secondary | ICD-10-CM

## 2020-07-05 DIAGNOSIS — F330 Major depressive disorder, recurrent, mild: Secondary | ICD-10-CM | POA: Diagnosis not present

## 2020-07-05 NOTE — Patient Instructions (Signed)
1. Continue sertraline 150 mg daily  2. Next appointment:3/3 at 1:20

## 2020-07-12 ENCOUNTER — Ambulatory Visit (INDEPENDENT_AMBULATORY_CARE_PROVIDER_SITE_OTHER): Payer: Medicare Other | Admitting: Psychiatry

## 2020-07-12 ENCOUNTER — Other Ambulatory Visit: Payer: Self-pay

## 2020-07-12 DIAGNOSIS — F330 Major depressive disorder, recurrent, mild: Secondary | ICD-10-CM

## 2020-07-12 DIAGNOSIS — F4310 Post-traumatic stress disorder, unspecified: Secondary | ICD-10-CM | POA: Diagnosis not present
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
## 2020-07-18 ENCOUNTER — Telehealth: Payer: Self-pay

## 2020-07-18 ENCOUNTER — Other Ambulatory Visit (HOSPITAL_COMMUNITY): Payer: Self-pay | Admitting: Psychiatry

## 2020-07-18 MED ORDER — SERTRALINE HCL 100 MG PO TABS: 150.0000 mg | ORAL_TABLET | Freq: Every day | ORAL | 1 refills | Status: DC

## 2020-07-18 NOTE — Telephone Encounter (Signed)
[facility] that [facility] needs a refill on sertraline   sertraline (ZOLOFT) 100 MG tablet Medication Date: 05/17/2020 Department: [facility] Ordering/Authorizing: Neysa Hotter, MD    Order Providers  Prescribing Provider Encounter Provider  Neysa Hotter, MD Neysa Hotter, MD   Outpatient Medication Detail   Disp Refills Start End   sertraline (ZOLOFT) 100 MG tablet 45 tablet 1 05/17/2020    Sig - Route: Take 1.5 tablets (150 mg total) by mouth daily. - Oral   Sent to pharmacy as: sertraline (ZOLOFT) 100 MG tablet   Notes to Pharmacy: Dose uptitrated   E-Prescribing Status: Receipt confirmed by pharmacy (05/17/2020 2:35 PM EST)    Pharmacy  [id number] - [place], [place] - [address] AT [facility]

## 2020-07-18 NOTE — Telephone Encounter (Signed)
Ordered

## 2020-07-26 ENCOUNTER — Telehealth (HOSPITAL_COMMUNITY): Payer: Self-pay | Admitting: Psychiatry

## 2020-07-26 ENCOUNTER — Other Ambulatory Visit: Payer: Self-pay

## 2020-07-26 ENCOUNTER — Ambulatory Visit (INDEPENDENT_AMBULATORY_CARE_PROVIDER_SITE_OTHER): Payer: Medicare Other | Admitting: Psychiatry

## 2020-07-26 DIAGNOSIS — F4310 Post-traumatic stress disorder, unspecified: Secondary | ICD-10-CM

## 2020-07-26 DIAGNOSIS — F330 Major depressive disorder, recurrent, mild: Secondary | ICD-10-CM | POA: Diagnosis not present

## 2020-07-26 NOTE — Telephone Encounter (Signed)
Therapist called patient for scheduled appointment and received voicemail recording.  Therapist left message indicating attempt and requesting patient call office.
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
## 2020-08-06 DIAGNOSIS — Z0289 Encounter for other administrative examinations: Secondary | ICD-10-CM

## 2020-08-09 ENCOUNTER — Other Ambulatory Visit: Payer: Self-pay

## 2020-08-09 ENCOUNTER — Ambulatory Visit (INDEPENDENT_AMBULATORY_CARE_PROVIDER_SITE_OTHER): Payer: Medicare Other | Admitting: Psychiatry

## 2020-08-09 DIAGNOSIS — F330 Major depressive disorder, recurrent, mild: Secondary | ICD-10-CM

## 2020-08-09 DIAGNOSIS — F4310 Post-traumatic stress disorder, unspecified: Secondary | ICD-10-CM
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
## 2020-08-20 ENCOUNTER — Ambulatory Visit (HOSPITAL_COMMUNITY): Payer: Medicare Other | Admitting: Psychiatry

## 2020-08-20 ENCOUNTER — Other Ambulatory Visit: Payer: Self-pay

## 2020-08-23 ENCOUNTER — Ambulatory Visit (HOSPITAL_COMMUNITY): Payer: Medicare Other | Admitting: Psychiatry
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
## 2020-08-30 ENCOUNTER — Telehealth (INDEPENDENT_AMBULATORY_CARE_PROVIDER_SITE_OTHER): Payer: Medicare Other | Admitting: Psychiatry

## 2020-08-30 ENCOUNTER — Encounter: Payer: Self-pay | Admitting: Psychiatry

## 2020-08-30 DIAGNOSIS — F331 Major depressive disorder, recurrent, moderate: Secondary | ICD-10-CM | POA: Diagnosis not present

## 2020-08-30 DIAGNOSIS — F4310 Post-traumatic stress disorder, unspecified: Secondary | ICD-10-CM | POA: Diagnosis not present

## 2020-08-30 MED ORDER — SERTRALINE HCL 100 MG PO TABS: 200.0000 mg | ORAL_TABLET | Freq: Every day | ORAL | 0 refills | Status: DC

## 2020-08-30 NOTE — Patient Instructions (Signed)
1. Increase sertraline 200 mg daily  2. Discontinue doxepin 3. Next appointment:3/31 at 3:30

## 2020-09-03 ENCOUNTER — Ambulatory Visit (INDEPENDENT_AMBULATORY_CARE_PROVIDER_SITE_OTHER): Payer: Medicare Other | Admitting: Psychiatry

## 2020-09-03 ENCOUNTER — Other Ambulatory Visit: Payer: Self-pay

## 2020-09-03 DIAGNOSIS — F4310 Post-traumatic stress disorder, unspecified: Secondary | ICD-10-CM | POA: Diagnosis not present

## 2020-09-03 DIAGNOSIS — F331 Major depressive disorder, recurrent, moderate: Secondary | ICD-10-CM
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
## 2020-09-17 ENCOUNTER — Ambulatory Visit (HOSPITAL_COMMUNITY): Payer: Medicare Other | Admitting: Psychiatry
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
## 2020-09-27 ENCOUNTER — Encounter: Payer: Self-pay | Admitting: Psychiatry

## 2020-09-27 ENCOUNTER — Other Ambulatory Visit: Payer: Self-pay

## 2020-09-27 ENCOUNTER — Telehealth (INDEPENDENT_AMBULATORY_CARE_PROVIDER_SITE_OTHER): Payer: Medicare Other | Admitting: Psychiatry

## 2020-09-27 DIAGNOSIS — F331 Major depressive disorder, recurrent, moderate: Secondary | ICD-10-CM | POA: Diagnosis not present

## 2020-09-27 DIAGNOSIS — F4310 Post-traumatic stress disorder, unspecified: Secondary | ICD-10-CM

## 2020-09-27 MED ORDER — VENLAFAXINE HCL ER 75 MG PO CP24: ORAL_CAPSULE | ORAL | 0 refills | Status: DC

## 2020-09-27 MED ORDER — VENLAFAXINE HCL ER 37.5 MG PO CP24: 37.5000 mg | ORAL_CAPSULE | Freq: Every day | ORAL | 0 refills | Status: DC

## 2020-10-01 ENCOUNTER — Ambulatory Visit (INDEPENDENT_AMBULATORY_CARE_PROVIDER_SITE_OTHER): Payer: Medicare Other | Admitting: Psychiatry

## 2020-10-01 ENCOUNTER — Other Ambulatory Visit: Payer: Self-pay

## 2020-10-01 ENCOUNTER — Telehealth (HOSPITAL_COMMUNITY): Payer: Self-pay | Admitting: Psychiatry

## 2020-10-01 DIAGNOSIS — F331 Major depressive disorder, recurrent, moderate: Secondary | ICD-10-CM

## 2020-10-01 DIAGNOSIS — F4310 Post-traumatic stress disorder, unspecified: Secondary | ICD-10-CM
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
## 2020-10-01 NOTE — Telephone Encounter (Signed)
Therapist attempted to contact patient twice via text through caregility platform for scheduled appointment, no response.  Therapist called patient, left message indicating attempt and requesting patient call office. °

## 2020-10-15 ENCOUNTER — Ambulatory Visit (HOSPITAL_COMMUNITY): Payer: Medicare Other | Admitting: Psychiatry
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
## 2020-10-22 ENCOUNTER — Ambulatory Visit (INDEPENDENT_AMBULATORY_CARE_PROVIDER_SITE_OTHER): Payer: Medicare Other | Admitting: Psychiatry

## 2020-10-22 ENCOUNTER — Other Ambulatory Visit: Payer: Self-pay

## 2020-10-22 DIAGNOSIS — F331 Major depressive disorder, recurrent, moderate: Secondary | ICD-10-CM

## 2020-10-22 DIAGNOSIS — F4310 Post-traumatic stress disorder, unspecified: Secondary | ICD-10-CM | POA: Diagnosis not present
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
## 2020-10-24 ENCOUNTER — Encounter: Payer: Self-pay | Admitting: Adult Health

## 2020-10-24 ENCOUNTER — Ambulatory Visit (INDEPENDENT_AMBULATORY_CARE_PROVIDER_SITE_OTHER): Payer: Medicare Other | Admitting: Adult Health

## 2020-10-24 VITALS — BP 122/66 | HR 80 | Ht 65.0 in | Wt 210.0 lb

## 2020-10-24 DIAGNOSIS — J449 Chronic obstructive pulmonary disease, unspecified: Secondary | ICD-10-CM | POA: Diagnosis not present

## 2020-10-24 DIAGNOSIS — G4733 Obstructive sleep apnea (adult) (pediatric): Secondary | ICD-10-CM

## 2020-10-24 NOTE — Progress Notes (Signed)
PATIENT: [name] DOB: [birth year]  REASON FOR VISIT: follow up HISTORY FROM: patient  HISTORY OF PRESENT ILLNESS: Today 10/24/20:  [name] is a 55 year old female with a history of obstructive sleep apnea on CPAP.  She returns today for follow-up.  She reports that she just got new supplies and will start using the CPAP more consistently.  She states her previous supplies did not fit well therefore she was unable to use the CPAP for very long.  Her download is below  10/27/19: [name] is a 55 year old female with a history of obstructive sleep apnea on CPAP.  Her download indicates that she use her machine nightly for compliance of 100%.  She used her machine greater than 4 hours 24 days for compliance of 80%.  On average she uses her machine 5 hours and 11 minutes.  Her residual AHI is 1.5 on 14 cm of water with EPR 3.  Leak in the 95th percentile is 13.2 L/min she reports that the CPAP Pap works well for her.  She does not like to sleep without it.  HISTORY (Copied from Dr.Dohmeier's note)  07-25-2018, [name] is a mean by 55 year old Caucasian female patient who has been on CPAP for several years now.  She has used her machine 20 out of 30 days but she struggles with using it over 3 hours daily.  Her average just felt short at 3 hours and 47 minutes.  The set pressure is 12 cmH2O with 3 cm EPR and the residual AHI is acceptable at 6.6/h.  She does not have major air leakage, her mask seems to fit very well but she does have nocturnal coughing and discomfort from using the CPAP through the night- dry nose , dry mouth. She is no longer on 02 at night.  She still sleeps with her dog in the same bed.  We are discussing to change the humidifier settings and to use a weighted blanket. [employer].   REVIEW OF SYSTEMS: Out of a complete 14 system review of symptoms, the patient complains only of the following symptoms, and all other reviewed systems are negative.  FSS  35 ESS 8  ALLERGIES: Allergies  Allergen Reactions  . Metformin Diarrhea    HOME MEDICATIONS: Outpatient Medications Prior to Visit  Medication Sig Dispense Refill  . aspirin EC 81 MG tablet Take 81 mg by mouth daily.    Marland Kitchen atorvastatin (LIPITOR) 80 MG tablet Take 80 mg by mouth daily.     . hydrochlorothiazide (HYDRODIURIL) 12.5 MG tablet Take 12.5 mg by mouth daily.    . insulin degludec (TRESIBA) 100 UNIT/ML FlexTouch Pen Inject 40 Units into the skin daily.     Marland Kitchen latanoprost (XALATAN) 0.005 % ophthalmic solution Place 1 drop into both eyes at bedtime.     Marland Kitchen levothyroxine (SYNTHROID, LEVOTHROID) 200 MCG tablet Take 1 tablet (200 mcg total) by mouth daily before breakfast. 30 tablet 0  . megestrol (MEGACE) 40 MG tablet Take 1 tablet (40 mg total) by mouth daily. 3 tablets a day for 5 days, 2 tablets a day for 5 days then 1 tablet daily 45 tablet 3  . telmisartan (MICARDIS) 40 MG tablet Take 40 mg by mouth daily.    Marland Kitchen venlafaxine XR (EFFEXOR-XR) 37.5 MG 24 hr capsule Take 1 capsule (37.5 mg total) by mouth daily with breakfast. 7 capsule 0  . venlafaxine XR (EFFEXOR-XR) 75 MG 24 hr capsule 75 mg daily. Complete after 37.5 mg daily  for one week 30 capsule 0  . ALPRAZolam (XANAX) 0.5 MG tablet Take 0.5 mg by mouth daily as needed for anxiety.      No facility-administered medications prior to visit.    PAST MEDICAL HISTORY: Past Medical History:  Diagnosis Date  . Anxiety   . Arthritis   . Asthma   . Blind   . Chronic respiratory failure (HCC)   . COPD (chronic obstructive pulmonary disease) (HCC)   . Depression   . Diabetes mellitus without complication (HCC)   . Glaucoma   . Hyperlipidemia   . Hypertension   . Hypothyroidism   . PONV (postoperative nausea and vomiting)   . Shortness of breath   . Sleep apnea    12  . Thyroid disease     PAST SURGICAL HISTORY: Past Surgical History:  Procedure Laterality Date  . ANKLE SURGERY Right 2007  . ENDOMETRIAL ABLATION     . ORIF ANKLE FRACTURE Left 12/01/2012   Procedure: OPEN REDUCTION INTERNAL FIXATION (ORIF) ANKLE FRACTURE;  Surgeon: Darreld Mclean, MD;  Location: [facility];  Service: Orthopedics;  Laterality: Left;  . TONSILLECTOMY    . TUBAL LIGATION    . TUBAL LIGATION      FAMILY HISTORY: Family History  Problem Relation Age of Onset  . Cancer Mother   . Heart failure Father   . Other Son        MVA    SOCIAL HISTORY: Social History   Socioeconomic History  . Marital status: Widowed    Spouse name: Not on file  . Number of children: 2  . Years of education: 4  . Highest education level: Not on file  Occupational History  . Occupation: N/A  Tobacco Use  . Smoking status: Current Every Day Smoker    Packs/day: 0.50    Years: 39.00    Pack years: 19.50    Types: Cigarettes  . Smokeless tobacco: Never Used  Vaping Use  . Vaping Use: Never used  Substance and Sexual Activity  . Alcohol use: No  . Drug use: No  . Sexual activity: Not Currently    Birth control/protection: Surgical    Comment: tubal  Other Topics Concern  . Not on file  Social History Narrative   Lives at home w/ roommates   Right-handed   Caffeine: occasional coffee   Social Determinants of Health   Financial Resource Strain: Not on file  Food Insecurity: Not on file  Transportation Needs: Not on file  Physical Activity: Not on file  Stress: Not on file  Social Connections: Not on file  Intimate Partner Violence: Not on file      PHYSICAL EXAM  Vitals:   10/24/20 0907  BP: 122/66  Pulse: 80  Weight: 210 lb (95.3 kg)  Height: 5\' 5"  (1.651 m)   Body mass index is 34.95 kg/m.  Generalized: Well developed, in no acute distress  Chest: Lungs clear to auscultation bilaterally  Neurological examination  Mentation: Alert oriented to time, place, history taking. Follows all commands speech and language fluent Cranial nerve II-XII: Extraocular movements were full, visual field were full on  confrontational test Head turning and shoulder shrug  were normal and symmetric. Motor: The motor testing reveals 5 over 5 strength of all 4 extremities. Good symmetric motor tone is noted throughout.  Sensory: Sensory testing is intact to soft touch on all 4 extremities. No evidence of extinction is noted.  Gait and station: Gait is normal.    DIAGNOSTIC  DATA (LABS, IMAGING, TESTING) - I reviewed patient records, labs, notes, testing and imaging myself where available.  Lab Results  Component Value Date   WBC 6.8 04/22/2018   HGB 13.5 04/22/2018   HCT 38.3 04/22/2018   MCV 93.6 04/22/2018   PLT 175 04/22/2018      Component Value Date/Time   NA 135 04/22/2018 1440   K 3.7 04/22/2018 1440   CL 106 04/22/2018 1440   CO2 20 (L) 04/22/2018 1440   GLUCOSE 97 04/22/2018 1440   BUN 15 04/22/2018 1440   CREATININE 0.68 04/22/2018 1440   CALCIUM 8.9 04/22/2018 1440   PROT 7.4 04/22/2018 1440   ALBUMIN 3.6 04/22/2018 1440   AST 31 04/22/2018 1440   ALT 39 04/22/2018 1440   ALKPHOS 85 04/22/2018 1440   BILITOT 0.7 04/22/2018 1440   GFRNONAA >60 04/22/2018 1440   GFRAA >60 04/22/2018 1440    Lab Results  Component Value Date   HGBA1C 6.6 (H) 04/22/2018   No results found for: VITAMINB12 Lab Results  Component Value Date   TSH 3.472 12/01/2012      ASSESSMENT AND PLAN 55 y.o. year old female  has a past medical history of Anxiety, Arthritis, Asthma, Blind, Chronic respiratory failure (HCC), COPD (chronic obstructive pulmonary disease) (HCC), Depression, Diabetes mellitus without complication (HCC), Glaucoma, Hyperlipidemia, Hypertension, Hypothyroidism, PONV (postoperative nausea and vomiting), Shortness of breath, Sleep apnea, and Thyroid disease. here with:  1. OSA on CPAP  - CPAP compliance excellent - Good treatment of AHI  - Encourage patient to use CPAP nightly and > 4 hours each night - F/U in 1 year or sooner if needed    Butch Penny, MSN, NP-C 10/24/2020,  10:15 AM [facility] [address] [place], [place] [address] [phone]

## 2020-10-24 NOTE — Patient Instructions (Signed)
Continue using CPAP nightly and greater than 4 hours each night °If your symptoms worsen or you develop new symptoms please let us know.  ° °
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
## 2020-10-26 ENCOUNTER — Telehealth: Payer: Medicare Other | Admitting: Psychiatry

## 2020-10-29 ENCOUNTER — Other Ambulatory Visit (HOSPITAL_COMMUNITY): Payer: Self-pay | Admitting: Nurse Practitioner

## 2020-10-29 DIAGNOSIS — Z1231 Encounter for screening mammogram for malignant neoplasm of breast: Secondary | ICD-10-CM

## 2020-10-30 ENCOUNTER — Telehealth: Payer: Self-pay | Admitting: Psychiatry

## 2020-10-30 ENCOUNTER — Telehealth: Payer: Medicare Other | Admitting: Psychiatry

## 2020-10-30 ENCOUNTER — Other Ambulatory Visit: Payer: Self-pay

## 2020-10-30 NOTE — Telephone Encounter (Signed)
Sent link for video visit through Epic. Patient did not sign in. Called the patient for appointment scheduled today. The patient did not answer the phone. Left voice message to contact the office ([phone]).   ?

## 2020-10-31 ENCOUNTER — Other Ambulatory Visit: Payer: Self-pay | Admitting: Psychiatry

## 2020-10-31 ENCOUNTER — Telehealth: Payer: Self-pay

## 2020-10-31 MED ORDER — VENLAFAXINE HCL ER 75 MG PO CP24: 75.0000 mg | ORAL_CAPSULE | Freq: Every day | ORAL | 0 refills | Status: DC
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
## 2020-10-31 NOTE — Telephone Encounter (Signed)
Ordered

## 2020-10-31 NOTE — Telephone Encounter (Signed)
Received fax requesting a refill on the venlafaxine hcl er 75mg   venlafaxine XR (EFFEXOR-XR) 75 MG 24 hr capsule Medication Date: 09/27/2020 Department: [facility] Ordering/Authorizing: [facility], MD    Order Providers  Prescribing Provider Encounter Provider  Neysa Hotter, MD Neysa Hotter, MD   Outpatient Medication Detail   Disp Refills Start End   venlafaxine XR (EFFEXOR-XR) 75 MG 24 hr capsule 30 capsule 0 09/27/2020    Sig: 75 mg daily. Complete after 37.5 mg daily for one week   Sent to pharmacy as: venlafaxine XR (EFFEXOR-XR) 75 MG 24 hr capsule   E-Prescribing Status: Receipt confirmed by pharmacy (09/27/2020 3:57 PM EDT)    Pharmacy  [id number] - [place], [place] - [address] AT [facility]

## 2020-11-05 ENCOUNTER — Other Ambulatory Visit: Payer: Self-pay

## 2020-11-05 ENCOUNTER — Ambulatory Visit (INDEPENDENT_AMBULATORY_CARE_PROVIDER_SITE_OTHER): Payer: Medicare Other | Admitting: Psychiatry

## 2020-11-05 DIAGNOSIS — F331 Major depressive disorder, recurrent, moderate: Secondary | ICD-10-CM

## 2020-11-05 DIAGNOSIS — F4310 Post-traumatic stress disorder, unspecified: Secondary | ICD-10-CM | POA: Diagnosis not present

## 2020-11-05 LAB — COLOGUARD: COLOGUARD: NEGATIVE
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
## 2020-11-07 ENCOUNTER — Ambulatory Visit (HOSPITAL_COMMUNITY)
Admission: RE | Admit: 2020-11-07 | Discharge: 2020-11-07 | Disposition: A | Discharge status: Home / Self Care | Payer: Medicare Other | Source: Ambulatory Visit | Attending: Nurse Practitioner | Admitting: Nurse Practitioner

## 2020-11-07 ENCOUNTER — Encounter: Payer: Self-pay | Admitting: Psychiatry

## 2020-11-07 ENCOUNTER — Other Ambulatory Visit: Payer: Self-pay

## 2020-11-07 ENCOUNTER — Telehealth (INDEPENDENT_AMBULATORY_CARE_PROVIDER_SITE_OTHER): Payer: Medicare Other | Admitting: Psychiatry

## 2020-11-07 DIAGNOSIS — Z1231 Encounter for screening mammogram for malignant neoplasm of breast: Secondary | ICD-10-CM | POA: Insufficient documentation

## 2020-11-07 DIAGNOSIS — F4310 Post-traumatic stress disorder, unspecified: Secondary | ICD-10-CM

## 2020-11-07 DIAGNOSIS — F331 Major depressive disorder, recurrent, moderate: Secondary | ICD-10-CM | POA: Diagnosis not present

## 2020-11-07 MED ORDER — VENLAFAXINE HCL ER 150 MG PO CP24: 150.0000 mg | ORAL_CAPSULE | Freq: Every day | ORAL | 0 refills | Status: DC

## 2020-11-07 NOTE — Patient Instructions (Signed)
1. Increase venlafaxine 150 mg daily 2. Next appointment: 6/15 at 10:30

## 2020-11-19 ENCOUNTER — Ambulatory Visit (HOSPITAL_COMMUNITY): Payer: Medicare Other | Admitting: Psychiatry
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
## 2020-12-11 ENCOUNTER — Ambulatory Visit (HOSPITAL_COMMUNITY): Payer: Medicare Other | Admitting: Psychiatry

## 2020-12-11 ENCOUNTER — Other Ambulatory Visit: Payer: Self-pay

## 2020-12-11 ENCOUNTER — Telehealth (HOSPITAL_COMMUNITY): Payer: Self-pay | Admitting: Psychiatry

## 2020-12-11 NOTE — Telephone Encounter (Signed)
Therapist attempted to contact patient twice via text through caregility platform, no response.  Therapist called patient, left message indicating attempt, and requesting patient call office. 

## 2020-12-12 ENCOUNTER — Telehealth (INDEPENDENT_AMBULATORY_CARE_PROVIDER_SITE_OTHER): Payer: Medicare Other | Admitting: Psychiatry

## 2020-12-12 ENCOUNTER — Encounter: Payer: Self-pay | Admitting: Psychiatry

## 2020-12-12 ENCOUNTER — Other Ambulatory Visit: Payer: Self-pay

## 2020-12-12 DIAGNOSIS — F330 Major depressive disorder, recurrent, mild: Secondary | ICD-10-CM

## 2020-12-12 DIAGNOSIS — F4310 Post-traumatic stress disorder, unspecified: Secondary | ICD-10-CM

## 2020-12-12 MED ORDER — VENLAFAXINE HCL ER 75 MG PO CP24: 225.0000 mg | ORAL_CAPSULE | Freq: Every day | ORAL | 1 refills | Status: DC

## 2020-12-12 NOTE — Patient Instructions (Signed)
1. Increase venlafaxine 225 mg daily 2. Next appointment: 7/27 at 11 AM

## 2020-12-25 ENCOUNTER — Other Ambulatory Visit: Payer: Self-pay

## 2020-12-25 ENCOUNTER — Ambulatory Visit (INDEPENDENT_AMBULATORY_CARE_PROVIDER_SITE_OTHER): Payer: Medicare Other | Admitting: Psychiatry

## 2020-12-25 DIAGNOSIS — F330 Major depressive disorder, recurrent, mild: Secondary | ICD-10-CM

## 2020-12-25 DIAGNOSIS — F4310 Post-traumatic stress disorder, unspecified: Secondary | ICD-10-CM
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
## 2021-01-03 ENCOUNTER — Telehealth: Payer: Self-pay | Admitting: *Deleted

## 2021-01-03 NOTE — Telephone Encounter (Signed)
Staff called patient pharmacy and was not able to get through to the pharmacy. There was no voicemail as well. Staff called 3 times and was not able to reach anyone. Staff will call back at another time.

## 2021-01-03 NOTE — Telephone Encounter (Signed)
Decline. It was discontinued 

## 2021-01-03 NOTE — Telephone Encounter (Signed)
Patient pharmacy requesting 90 days script sent to them for patient Venlafaxine HCL ER 150mg 

## 2021-01-07 ENCOUNTER — Telehealth: Payer: Self-pay

## 2021-01-07 NOTE — Telephone Encounter (Signed)
Decline. The dose has been uptitrated, and she should have enough until the next visit.

## 2021-01-07 NOTE — Telephone Encounter (Signed)
received fax requesting a refill on the venlafaxine hcl er 150mg 

## 2021-01-08 ENCOUNTER — Other Ambulatory Visit: Payer: Self-pay

## 2021-01-08 ENCOUNTER — Ambulatory Visit (HOSPITAL_COMMUNITY): Payer: Medicare Other | Admitting: Psychiatry

## 2021-01-15 ENCOUNTER — Telehealth (HOSPITAL_COMMUNITY): Payer: Self-pay | Admitting: Psychiatry

## 2021-01-15 ENCOUNTER — Ambulatory Visit (INDEPENDENT_AMBULATORY_CARE_PROVIDER_SITE_OTHER): Payer: Medicare Other | Admitting: Psychiatry

## 2021-01-15 ENCOUNTER — Other Ambulatory Visit: Payer: Self-pay

## 2021-01-15 DIAGNOSIS — F330 Major depressive disorder, recurrent, mild: Secondary | ICD-10-CM | POA: Diagnosis not present

## 2021-01-15 DIAGNOSIS — F4310 Post-traumatic stress disorder, unspecified: Secondary | ICD-10-CM | POA: Diagnosis not present
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
## 2021-01-15 NOTE — Telephone Encounter (Signed)
Therapist attempted to contact patient twice via text through care agility platform for scheduled appointment, no response.  Therapist called patient, left message indicating attempt, and requesting patient call office. 
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
## 2021-01-23 ENCOUNTER — Other Ambulatory Visit: Payer: Self-pay

## 2021-01-23 ENCOUNTER — Encounter: Payer: Self-pay | Admitting: Psychiatry

## 2021-01-23 ENCOUNTER — Telehealth (INDEPENDENT_AMBULATORY_CARE_PROVIDER_SITE_OTHER): Payer: Medicare Other | Admitting: Psychiatry

## 2021-01-23 DIAGNOSIS — F4310 Post-traumatic stress disorder, unspecified: Secondary | ICD-10-CM

## 2021-01-23 DIAGNOSIS — F330 Major depressive disorder, recurrent, mild: Secondary | ICD-10-CM

## 2021-01-23 MED ORDER — VENLAFAXINE HCL ER 75 MG PO CP24
225.0000 mg | ORAL_CAPSULE | Freq: Every day | ORAL | 1 refills | Status: DC
Start: 2021-02-11 — End: 2021-05-02

## 2021-01-23 NOTE — Patient Instructions (Signed)
1. Continue venlafaxine 225 mg daily 2. Next appointment: 9/2 at 11 AM

## 2021-01-29 ENCOUNTER — Ambulatory Visit (HOSPITAL_COMMUNITY): Payer: Medicare Other | Admitting: Psychiatry

## 2021-02-11 ENCOUNTER — Telehealth: Payer: Self-pay

## 2021-02-11 NOTE — Telephone Encounter (Signed)
Pharmacy notified.

## 2021-02-11 NOTE — Telephone Encounter (Signed)
Decline- she is not on this medication anymore

## 2021-02-11 NOTE — Telephone Encounter (Signed)
received fax requesting a refill on the sertraline hcl 100mg 

## 2021-02-12 ENCOUNTER — Ambulatory Visit (INDEPENDENT_AMBULATORY_CARE_PROVIDER_SITE_OTHER): Payer: Medicare Other | Admitting: Psychiatry

## 2021-02-12 ENCOUNTER — Other Ambulatory Visit: Payer: Self-pay

## 2021-02-12 DIAGNOSIS — F4310 Post-traumatic stress disorder, unspecified: Secondary | ICD-10-CM | POA: Diagnosis not present

## 2021-02-12 DIAGNOSIS — F330 Major depressive disorder, recurrent, mild: Secondary | ICD-10-CM
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
## 2021-03-01 ENCOUNTER — Other Ambulatory Visit: Payer: Self-pay

## 2021-03-01 ENCOUNTER — Encounter: Payer: Self-pay | Admitting: Psychiatry

## 2021-03-01 ENCOUNTER — Telehealth (INDEPENDENT_AMBULATORY_CARE_PROVIDER_SITE_OTHER): Payer: Medicare Other | Admitting: Psychiatry

## 2021-03-01 DIAGNOSIS — F4310 Post-traumatic stress disorder, unspecified: Secondary | ICD-10-CM | POA: Diagnosis not present

## 2021-03-01 DIAGNOSIS — G4700 Insomnia, unspecified: Secondary | ICD-10-CM

## 2021-03-01 DIAGNOSIS — F330 Major depressive disorder, recurrent, mild: Secondary | ICD-10-CM

## 2021-03-01 MED ORDER — TRAZODONE HCL 50 MG PO TABS: ORAL_TABLET | ORAL | 1 refills | Status: DC

## 2021-03-01 NOTE — Patient Instructions (Signed)
1. Continue venlafaxine 225 mg daily 2. Start Trazodone 25- 50 mg at night as needed for insomnia 2. Next appointment: 10/13 at 3:30

## 2021-03-05 ENCOUNTER — Ambulatory Visit (HOSPITAL_COMMUNITY): Payer: Medicare Other | Admitting: Psychiatry

## 2021-03-19 ENCOUNTER — Ambulatory Visit (INDEPENDENT_AMBULATORY_CARE_PROVIDER_SITE_OTHER): Payer: Medicare Other | Admitting: Psychiatry

## 2021-03-19 ENCOUNTER — Other Ambulatory Visit: Payer: Self-pay

## 2021-03-19 DIAGNOSIS — F330 Major depressive disorder, recurrent, mild: Secondary | ICD-10-CM | POA: Diagnosis not present

## 2021-03-19 DIAGNOSIS — F4310 Post-traumatic stress disorder, unspecified: Secondary | ICD-10-CM | POA: Diagnosis not present
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
## 2021-04-02 ENCOUNTER — Other Ambulatory Visit: Payer: Self-pay

## 2021-04-02 ENCOUNTER — Ambulatory Visit (INDEPENDENT_AMBULATORY_CARE_PROVIDER_SITE_OTHER): Payer: Medicare Other | Admitting: Psychiatry

## 2021-04-02 DIAGNOSIS — F4310 Post-traumatic stress disorder, unspecified: Secondary | ICD-10-CM | POA: Diagnosis not present

## 2021-04-02 DIAGNOSIS — F330 Major depressive disorder, recurrent, mild: Secondary | ICD-10-CM
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
## 2021-04-11 ENCOUNTER — Telehealth: Payer: Medicare Other | Admitting: Psychiatry

## 2021-04-29 ENCOUNTER — Other Ambulatory Visit: Payer: Self-pay

## 2021-04-29 ENCOUNTER — Ambulatory Visit (INDEPENDENT_AMBULATORY_CARE_PROVIDER_SITE_OTHER): Payer: Medicare Other | Admitting: Psychiatry

## 2021-04-29 DIAGNOSIS — F4310 Post-traumatic stress disorder, unspecified: Secondary | ICD-10-CM

## 2021-04-29 DIAGNOSIS — F330 Major depressive disorder, recurrent, mild: Secondary | ICD-10-CM | POA: Diagnosis not present
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
## 2021-05-02 ENCOUNTER — Encounter: Payer: Self-pay | Admitting: Psychiatry

## 2021-05-02 ENCOUNTER — Other Ambulatory Visit: Payer: Self-pay

## 2021-05-02 ENCOUNTER — Telehealth (INDEPENDENT_AMBULATORY_CARE_PROVIDER_SITE_OTHER): Payer: Medicare Other | Admitting: Psychiatry

## 2021-05-02 DIAGNOSIS — G4700 Insomnia, unspecified: Secondary | ICD-10-CM | POA: Diagnosis not present

## 2021-05-02 DIAGNOSIS — F4310 Post-traumatic stress disorder, unspecified: Secondary | ICD-10-CM

## 2021-05-02 DIAGNOSIS — F330 Major depressive disorder, recurrent, mild: Secondary | ICD-10-CM | POA: Diagnosis not present

## 2021-05-02 MED ORDER — TRAZODONE HCL 50 MG PO TABS: ORAL_TABLET | ORAL | 1 refills | Status: DC

## 2021-05-02 MED ORDER — VENLAFAXINE HCL ER 75 MG PO CP24: 225.0000 mg | ORAL_CAPSULE | Freq: Every day | ORAL | 1 refills | Status: DC

## 2021-05-02 NOTE — Patient Instructions (Signed)
1. Continue venlafaxine 225 mg daily 2. Continue Trazodone 25- 50 mg at night as needed for insomnia 2. Next appointment: 1/9 at 1 PM

## 2021-05-13 ENCOUNTER — Other Ambulatory Visit: Payer: Self-pay

## 2021-05-13 ENCOUNTER — Ambulatory Visit (INDEPENDENT_AMBULATORY_CARE_PROVIDER_SITE_OTHER): Payer: Medicare Other | Admitting: Psychiatry

## 2021-05-13 DIAGNOSIS — F330 Major depressive disorder, recurrent, mild: Secondary | ICD-10-CM

## 2021-05-13 DIAGNOSIS — F4310 Post-traumatic stress disorder, unspecified: Secondary | ICD-10-CM | POA: Diagnosis not present
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
## 2021-06-04 ENCOUNTER — Other Ambulatory Visit: Payer: Self-pay | Admitting: Psychiatry
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
## 2021-07-05 ENCOUNTER — Ambulatory Visit (HOSPITAL_COMMUNITY): Payer: Medicare Other | Admitting: Psychiatry

## 2021-07-08 ENCOUNTER — Other Ambulatory Visit: Payer: Self-pay

## 2021-07-08 ENCOUNTER — Encounter: Payer: Self-pay | Admitting: Psychiatry

## 2021-07-08 ENCOUNTER — Telehealth (INDEPENDENT_AMBULATORY_CARE_PROVIDER_SITE_OTHER): Payer: Medicare Other | Admitting: Psychiatry

## 2021-07-08 DIAGNOSIS — F4310 Post-traumatic stress disorder, unspecified: Secondary | ICD-10-CM | POA: Diagnosis not present

## 2021-07-08 DIAGNOSIS — G4700 Insomnia, unspecified: Secondary | ICD-10-CM | POA: Diagnosis not present

## 2021-07-08 DIAGNOSIS — F330 Major depressive disorder, recurrent, mild: Secondary | ICD-10-CM | POA: Diagnosis not present

## 2021-07-08 MED ORDER — TRAZODONE HCL 100 MG PO TABS: 100.0000 mg | ORAL_TABLET | Freq: Every evening | ORAL | 0 refills | Status: DC | PRN

## 2021-07-08 NOTE — Patient Instructions (Signed)
1. Continue venlafaxine 225 mg daily 2. Increase Trazodone 100 mg at night as needed for insomnia 3. Next appointment: 3/6 at 2 PM, in person  The next visit will be in person visit. Please arrive 15 mins before the scheduled time.   [facility]  Address: [address], [place], [place] [address]

## 2021-07-19 ENCOUNTER — Ambulatory Visit (HOSPITAL_COMMUNITY): Payer: Medicare Other | Admitting: Psychiatry

## 2021-08-02 ENCOUNTER — Ambulatory Visit (INDEPENDENT_AMBULATORY_CARE_PROVIDER_SITE_OTHER): Payer: Medicare Other | Admitting: Psychiatry

## 2021-08-02 ENCOUNTER — Other Ambulatory Visit: Payer: Self-pay

## 2021-08-02 DIAGNOSIS — F4310 Post-traumatic stress disorder, unspecified: Secondary | ICD-10-CM | POA: Diagnosis not present

## 2021-08-02 DIAGNOSIS — F330 Major depressive disorder, recurrent, mild: Secondary | ICD-10-CM
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
## 2021-08-26 ENCOUNTER — Ambulatory Visit (INDEPENDENT_AMBULATORY_CARE_PROVIDER_SITE_OTHER): Payer: Medicare Other | Admitting: Psychiatry

## 2021-08-26 ENCOUNTER — Other Ambulatory Visit: Payer: Self-pay

## 2021-08-26 DIAGNOSIS — F330 Major depressive disorder, recurrent, mild: Secondary | ICD-10-CM | POA: Diagnosis not present

## 2021-08-26 DIAGNOSIS — F4310 Post-traumatic stress disorder, unspecified: Secondary | ICD-10-CM
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
## 2021-09-02 ENCOUNTER — Ambulatory Visit: Payer: Medicare Other | Admitting: Psychiatry

## 2021-09-03 ENCOUNTER — Other Ambulatory Visit: Payer: Self-pay | Admitting: Psychiatry

## 2021-09-09 ENCOUNTER — Ambulatory Visit (INDEPENDENT_AMBULATORY_CARE_PROVIDER_SITE_OTHER): Payer: Medicare Other | Admitting: Psychiatry

## 2021-09-09 ENCOUNTER — Other Ambulatory Visit: Payer: Self-pay

## 2021-09-09 DIAGNOSIS — F4310 Post-traumatic stress disorder, unspecified: Secondary | ICD-10-CM
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
## 2021-10-01 ENCOUNTER — Ambulatory Visit (HOSPITAL_COMMUNITY): Payer: Medicare Other | Admitting: Psychiatry
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
## 2021-10-07 ENCOUNTER — Ambulatory Visit: Payer: Medicare Other | Admitting: Psychiatry

## 2021-10-11 ENCOUNTER — Telehealth: Payer: Self-pay | Admitting: Psychiatry

## 2021-10-11 ENCOUNTER — Other Ambulatory Visit: Payer: Self-pay | Admitting: Psychiatry

## 2021-10-11 NOTE — Telephone Encounter (Signed)
Ordered refill of Trazodone per request. Please contact the patient to make follow up appointment. I will not be able to prescribe any more refills without evaluation. 

## 2021-10-15 ENCOUNTER — Ambulatory Visit (HOSPITAL_COMMUNITY): Payer: Medicare Other | Admitting: Psychiatry

## 2021-10-28 ENCOUNTER — Ambulatory Visit (HOSPITAL_COMMUNITY): Payer: Medicare Other | Admitting: Psychiatry

## 2021-10-29 ENCOUNTER — Ambulatory Visit (HOSPITAL_COMMUNITY): Payer: Medicare Other | Admitting: Psychiatry

## 2021-10-30 ENCOUNTER — Ambulatory Visit: Payer: Medicare Other | Admitting: Adult Health

## 2021-10-31 ENCOUNTER — Ambulatory Visit (INDEPENDENT_AMBULATORY_CARE_PROVIDER_SITE_OTHER): Payer: Medicare Other | Admitting: Psychiatry

## 2021-10-31 ENCOUNTER — Encounter (HOSPITAL_COMMUNITY): Payer: Self-pay

## 2021-10-31 DIAGNOSIS — F4310 Post-traumatic stress disorder, unspecified: Secondary | ICD-10-CM | POA: Diagnosis not present

## 2021-10-31 DIAGNOSIS — F331 Major depressive disorder, recurrent, moderate: Secondary | ICD-10-CM | POA: Diagnosis not present
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
## 2021-10-31 NOTE — Plan of Care (Signed)
This is a tx plan review/revision - Pt participated in process ?Problem: PTSD-Trauma Disorder CCP re-experiencing, avoidant behaviors, trust issues- maladaptive thoughts ?Goal: " I want to be normal, to trust, go places by myself without fear, possibly meet someone without thinking they are going to hurt me" ?Outcome: Progressing ?Goal: LTG: Reduce frequency, intensity, and duration of PTSD symptoms so daily functioning is improved AEB by pt going out with friends without being overwhelmed  1 x per week for 4 consecutive weeks per patient's report.  ?Outcome: Progressing ?Goal: STG: Increase participation in previously avoided activities such as going to the grocery store, go to a movie, shopping, etc. by myself ?Outcome: Progressing ?  ?

## 2021-11-11 ENCOUNTER — Ambulatory Visit (HOSPITAL_COMMUNITY): Payer: Medicare Other | Admitting: Psychiatry

## 2021-12-02 ENCOUNTER — Telehealth (HOSPITAL_COMMUNITY): Payer: Self-pay | Admitting: Psychiatry

## 2021-12-02 ENCOUNTER — Ambulatory Visit (INDEPENDENT_AMBULATORY_CARE_PROVIDER_SITE_OTHER): Payer: Medicare Other | Admitting: Psychiatry

## 2021-12-02 DIAGNOSIS — F331 Major depressive disorder, recurrent, moderate: Secondary | ICD-10-CM

## 2021-12-02 DIAGNOSIS — F4310 Post-traumatic stress disorder, unspecified: Secondary | ICD-10-CM | POA: Diagnosis not present
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
## 2021-12-02 NOTE — Telephone Encounter (Signed)
Therapist attempted to contact patient twice via text through caregility platform, no response.  Therapist called patient, left message indicating attempt  and requesting patient call office 

## 2021-12-25 ENCOUNTER — Ambulatory Visit (HOSPITAL_COMMUNITY): Payer: Medicare Other | Admitting: Psychiatry

## 2022-01-06 ENCOUNTER — Telehealth: Payer: Self-pay | Admitting: *Deleted

## 2022-01-06 NOTE — Telephone Encounter (Signed)
LMVM for pt to bring sd card or mahine and powercord for DL for appt tomorrow.

## 2022-01-07 ENCOUNTER — Encounter: Payer: Self-pay | Admitting: Adult Health

## 2022-01-07 ENCOUNTER — Ambulatory Visit: Payer: Medicare Other | Admitting: Adult Health

## 2022-02-28 ENCOUNTER — Ambulatory Visit (HOSPITAL_COMMUNITY): Payer: Medicare Other | Admitting: Psychiatry

## 2023-02-20 NOTE — H&P (Signed)
Surgical History & Physical  Patient Name: [name]  DOB: 1966/01/29  Surgery: Cataract extraction with intraocular lens implant phacoemulsification; Left Eye Surgeon: Fabio Pierce MD Surgery Date: 02/27/2023 Pre-Op Date: 02/02/2023  HPI: A 7 Yr. old female patient 1. 1. The patient was referred to [place] from Dr. Charise Killian for a "Cataract evaluation/muscle surgery eval/glaucoma evaluation. The patient's vision is blurry. The condition's severity is constant. This is in the left eye The complaint is associated with difficulty driving at night due to halos/glare, difficulty reading traffic/street signs, and difficulty reading captions on tv. This is negatively affecting the patient's quality of life and the patient is unable to function adequately in life with the current level of vision. Patient also lost vision completely in the right eye several years ago. The eye now turns out. The patient wants to see if she can have the eye straightened. Patient using drops as directed. HPI Completed by Dr. Fabio Pierce  Medical History:  Thyroid Problems  Review of Systems Negative Allergic/Immunologic Negative Cardiovascular Negative Constitutional Negative Ear, Nose, Mouth & Throat Endocrine thyroid Negative Eyes Negative Gastrointestinal Negative Genitourinary Negative Hemotologic/Lymphatic Negative Integumentary Negative Musculoskeletal Negative Neurological Negative Psychiatry Negative Respiratory  Social Current every day smoker   Medication latanoprost ,  levothyroxine ,  triamcinolone acetonide ,  brimonidine   Sx/Procedures None  Drug Allergies  metformin   History & Physical: Heent: cataract NECK: supple without bruits LUNGS: lungs clear to auscultation CV: regular rate and rhythm Abdomen: soft and non-tender  Impression & Plan: Assessment: 1.  COMBINED FORMS AGE RELATED CATARACT; Left Eye (H25.812) 2.  NUCLEAR SCLEROSIS AGE RELATED; Right Eye (H25.11) 3.   BLEPHARITIS; Right Upper Lid, Right Lower Lid, Left Upper Lid, Left Lower Lid (H01.001, H01.002,H01.004,H01.005) 4.  DERMATOCHALASIS, no surgery; Right Upper Lid, Left Upper Lid (H02.831, H02.834) 5.  BLINDNESS RIGHT EYE CATEGORY 5, NORMAL VISION LEFT EYE (H54.415A) 6.  Ocular Hypertension; Both Eyes (H40.053) 7.  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  BAND KERATOPATHY; Right Eye [phone])  Plan: 1.  Cataract accounts for the patient's decreased vision. This visual impairment is not correctable with a tolerable change in glasses or contact lenses. Cataract surgery with an implantation of a new lens should significantly improve the visual and functional status of the patient. Discussed all risks, benefits, alternatives, and potential complications. Discussed the procedures and recovery. Patient desires to have surgery. A-scan ordered and performed today for intra-ocular lens calculations. The surgery will be performed in order to improve vision for driving, reading, and for eye examinations. Recommend phacoemulsification with intra-ocular lens. Recommend Dextenza for post-operative pain and inflammation. Left Eye - only. Dilates well - shugarcaine by protocol.  2.  Blind eye.  3.  Blepharitis is present - recommend regular lid cleaning.  4.  Asymptomatic, recommend observation for now. Findings, prognosis and treatment options reviewed.  5.  Monocular precautions discussed, including wearing shatterproof lenses.  6.  OCT rNFL Borderline OS 8/24 IOP well above normal and goal right eye. Concern for long-term damage to that eye. Start Brimonidine 1 drop both right eye every 12 hours. Continue Latanoprost 1 drop both eyes every night at bedtime.  7.  Sensory. Will send for strabismus after cataract surgery.  8.  Monitor.

## 2023-02-25 ENCOUNTER — Encounter (HOSPITAL_COMMUNITY): Payer: Self-pay

## 2023-02-25 ENCOUNTER — Encounter (HOSPITAL_COMMUNITY)
Admission: RE | Admit: 2023-02-25 | Discharge: 2023-02-25 | Disposition: A | Discharge status: Home / Self Care | Payer: 59 | Source: Ambulatory Visit | Attending: Ophthalmology | Admitting: Ophthalmology

## 2023-02-27 ENCOUNTER — Ambulatory Visit (HOSPITAL_BASED_OUTPATIENT_CLINIC_OR_DEPARTMENT_OTHER): Payer: 59 | Admitting: Anesthesiology

## 2023-02-27 ENCOUNTER — Encounter (HOSPITAL_COMMUNITY)
Admission: RE | Disposition: A | Discharge status: Home / Self Care | Payer: Self-pay | Source: Home / Self Care | Attending: Ophthalmology

## 2023-02-27 ENCOUNTER — Ambulatory Visit (HOSPITAL_COMMUNITY)
Admission: RE | Admit: 2023-02-27 | Discharge: 2023-02-27 | Disposition: A | Discharge status: Home / Self Care | Payer: 59 | Attending: Ophthalmology | Admitting: Ophthalmology

## 2023-02-27 ENCOUNTER — Ambulatory Visit (HOSPITAL_COMMUNITY): Payer: 59 | Admitting: Anesthesiology

## 2023-02-27 DIAGNOSIS — H547 Unspecified visual loss: Secondary | ICD-10-CM | POA: Insufficient documentation

## 2023-02-27 DIAGNOSIS — J4489 Other specified chronic obstructive pulmonary disease: Secondary | ICD-10-CM | POA: Insufficient documentation

## 2023-02-27 DIAGNOSIS — E119 Type 2 diabetes mellitus without complications: Secondary | ICD-10-CM | POA: Diagnosis not present

## 2023-02-27 DIAGNOSIS — H25812 Combined forms of age-related cataract, left eye: Secondary | ICD-10-CM

## 2023-02-27 DIAGNOSIS — F17210 Nicotine dependence, cigarettes, uncomplicated: Secondary | ICD-10-CM | POA: Diagnosis not present

## 2023-02-27 DIAGNOSIS — G4730 Sleep apnea, unspecified: Secondary | ICD-10-CM | POA: Insufficient documentation

## 2023-02-27 DIAGNOSIS — E039 Hypothyroidism, unspecified: Secondary | ICD-10-CM | POA: Insufficient documentation

## 2023-02-27 DIAGNOSIS — J449 Chronic obstructive pulmonary disease, unspecified: Secondary | ICD-10-CM | POA: Diagnosis not present

## 2023-02-27 DIAGNOSIS — F17200 Nicotine dependence, unspecified, uncomplicated: Secondary | ICD-10-CM | POA: Diagnosis not present

## 2023-02-27 DIAGNOSIS — H01009 Unspecified blepharitis unspecified eye, unspecified eyelid: Secondary | ICD-10-CM | POA: Insufficient documentation

## 2023-02-27 DIAGNOSIS — I10 Essential (primary) hypertension: Secondary | ICD-10-CM | POA: Insufficient documentation

## 2023-02-27 HISTORY — PX: CATARACT EXTRACTION W/PHACO: SHX586

## 2023-02-27 LAB — GLUCOSE, CAPILLARY: Glucose-Capillary: 87 mg/dL (ref 70–99)

## 2023-02-27 SURGERY — PHACOEMULSIFICATION, CATARACT, WITH IOL INSERTION
Anesthesia: Monitor Anesthesia Care | Site: Eye | Laterality: Left

## 2023-02-27 MED ORDER — TROPICAMIDE 1 % OP SOLN
1.0000 [drp] | OPHTHALMIC | Status: AC | PRN
  Administered 2023-02-27 (×3): 1 [drp] via OPHTHALMIC

## 2023-02-27 MED ORDER — MIDAZOLAM HCL 2 MG/2ML IJ SOLN
INTRAMUSCULAR | Status: AC
  Filled 2023-02-27: qty 2

## 2023-02-27 MED ORDER — PHENYLEPHRINE HCL 2.5 % OP SOLN
1.0000 [drp] | OPHTHALMIC | Status: AC | PRN
  Administered 2023-02-27 (×3): 1 [drp] via OPHTHALMIC

## 2023-02-27 MED ORDER — BSS IO SOLN
INTRAOCULAR | Status: DC | PRN
  Administered 2023-02-27: 15 mL via INTRAOCULAR

## 2023-02-27 MED ORDER — STERILE WATER FOR IRRIGATION IR SOLN
Status: DC | PRN
  Administered 2023-02-27: 1

## 2023-02-27 MED ORDER — POVIDONE-IODINE 5 % OP SOLN
OPHTHALMIC | Status: DC | PRN
  Administered 2023-02-27: 1 via OPHTHALMIC

## 2023-02-27 MED ORDER — LIDOCAINE HCL 3.5 % OP GEL
1.0000 | Freq: Once | OPHTHALMIC | Status: AC
  Administered 2023-02-27: 1 via OPHTHALMIC

## 2023-02-27 MED ORDER — TETRACAINE 0.5 % OP SOLN OPTIME - NO CHARGE
OPHTHALMIC | Status: DC | PRN
Start: 2023-02-27 — End: 2023-02-27
  Administered 2023-02-27: 2 [drp] via OPHTHALMIC

## 2023-02-27 MED ORDER — SODIUM HYALURONATE 10 MG/ML IO SOLUTION
PREFILLED_SYRINGE | INTRAOCULAR | Status: DC | PRN
  Administered 2023-02-27: .85 mL via INTRAOCULAR

## 2023-02-27 MED ORDER — MOXIFLOXACIN HCL 5 MG/ML IO SOLN
INTRAOCULAR | Status: DC | PRN
  Administered 2023-02-27: .3 mL via OPHTHALMIC

## 2023-02-27 MED ORDER — TETRACAINE HCL 0.5 % OP SOLN
1.0000 [drp] | OPHTHALMIC | Status: AC | PRN
  Administered 2023-02-27 (×3): 1 [drp] via OPHTHALMIC

## 2023-02-27 MED ORDER — LIDOCAINE HCL (PF) 1 % IJ SOLN
INTRAOCULAR | Status: DC | PRN
  Administered 2023-02-27: 1 mL via OPHTHALMIC

## 2023-02-27 MED ORDER — MIDAZOLAM HCL 2 MG/2ML IJ SOLN
INTRAMUSCULAR | Status: DC | PRN
  Administered 2023-02-27: 2 mg via INTRAVENOUS

## 2023-02-27 MED ORDER — PHENYLEPHRINE-KETOROLAC 1-0.3 % IO SOLN
INTRAOCULAR | Status: AC
  Filled 2023-02-27: qty 4

## 2023-02-27 MED ORDER — SODIUM HYALURONATE 23MG/ML IO SOSY
PREFILLED_SYRINGE | INTRAOCULAR | Status: DC | PRN
  Administered 2023-02-27: .6 mL via INTRAOCULAR

## 2023-02-27 MED ORDER — SODIUM CHLORIDE 0.9% FLUSH
INTRAVENOUS | Status: DC | PRN
  Administered 2023-02-27: 3 mL via INTRAVENOUS

## 2023-02-27 MED ORDER — PHENYLEPHRINE-KETOROLAC 1-0.3 % IO SOLN
INTRAOCULAR | Status: DC | PRN
  Administered 2023-02-27: 500 mL via OPHTHALMIC

## 2023-02-27 SURGICAL SUPPLY — 13 items
CATARACT SUITE SIGHTPATH (MISCELLANEOUS) ×1
CLOTH BEACON ORANGE TIMEOUT ST (SAFETY) ×1 IMPLANT
EYE SHIELD UNIVERSAL CLEAR (GAUZE/BANDAGES/DRESSINGS) IMPLANT
FEE CATARACT SUITE SIGHTPATH (MISCELLANEOUS) ×1 IMPLANT
GLOVE BIOGEL PI IND STRL 7.0 (GLOVE) ×2 IMPLANT
LENS IOL TECNIS EYHANCE 11.5 (Intraocular Lens) IMPLANT
NDL HYPO 18GX1.5 BLUNT FILL (NEEDLE) ×1 IMPLANT
NEEDLE HYPO 18GX1.5 BLUNT FILL (NEEDLE) ×1
PAD ARMBOARD 7.5X6 YLW CONV (MISCELLANEOUS) ×1 IMPLANT
POSITIONER HEAD 8X9X4 ADT (SOFTGOODS) ×1 IMPLANT
SYR TB 1ML LL NO SAFETY (SYRINGE) ×1 IMPLANT
TAPE SURG TRANSPORE 1 IN (GAUZE/BANDAGES/DRESSINGS) IMPLANT
WATER STERILE IRR 250ML POUR (IV SOLUTION) ×1 IMPLANT

## 2023-02-27 NOTE — Interval H&P Note (Signed)
History and Physical Interval Note:  02/27/2023 10:08 AM  [name]  has presented today for surgery, with the diagnosis of combined forms age related cataract, left eye.  The various methods of treatment have been discussed with the patient and family. After consideration of risks, benefits and other options for treatment, the patient has consented to  Procedure(s): CATARACT EXTRACTION PHACO AND INTRAOCULAR LENS PLACEMENT (IOC) (Left) as a surgical intervention.  The patient's history has been reviewed, patient examined, no change in status, stable for surgery.  I have reviewed the patient's chart and labs.  Questions were answered to the patient's satisfaction.     Fabio Pierce

## 2023-02-27 NOTE — Op Note (Signed)
Date of procedure: 02/27/23  Pre-operative diagnosis: Visually significant age-related combined cataract, Left Eye (H25.812)  Post-operative diagnosis: Visually significant age-related combined cataract, Left Eye (H25.812)  Procedure: Removal of cataract via phacoemulsification and insertion of intra-ocular lens Laural Benes and Johnson DIB00 +11.5D into the capsular bag of the Left Eye  Attending surgeon: Rudy Jew. Cherylanne Ardelean, MD, MA  Anesthesia: MAC, Topical Akten  Complications: None  Estimated Blood Loss: <15mL (minimal)  Specimens: None  Implants: As above  Indications:  Visually significant age-related cataract, Left Eye  Procedure:  The patient was seen and identified in the pre-operative area. The operative eye was identified and dilated.  The operative eye was marked.  Topical anesthesia was administered to the operative eye.     The patient was then to the operative suite and placed in the supine position.  A timeout was performed confirming the patient, procedure to be performed, and all other relevant information.   The patient's face was prepped and draped in the usual fashion for intra-ocular surgery.  A lid speculum was placed into the operative eye and the surgical microscope moved into place and focused.  An inferotemporal paracentesis was created using a 20 gauge paracentesis blade.  Shugarcaine was injected into the anterior chamber.  Viscoelastic was injected into the anterior chamber.  A temporal clear-corneal main wound incision was created using a 2.61mm microkeratome.  A continuous curvilinear capsulorrhexis was initiated using an irrigating cystitome and completed using capsulorrhexis forceps.  Hydrodissection and hydrodeliniation were performed.  Viscoelastic was injected into the anterior chamber.  A phacoemulsification handpiece and a chopper as a second instrument were used to remove the nucleus and epinucleus. The irrigation/aspiration handpiece was used to remove any  remaining cortical material.   The capsular bag was reinflated with viscoelastic, checked, and found to be intact.  The intraocular lens was inserted into the capsular bag.  The irrigation/aspiration handpiece was used to remove any remaining viscoelastic.  The clear corneal wound and paracentesis wounds were then hydrated and checked with Weck-Cels to be watertight. 0.41mL of Moxfloxacin was injected into the anterior chamber. The lid-speculum was removed.  The drape was removed.  The patient's face was cleaned with a wet and dry 4x4.    A clear shield was taped over the eye. The patient was taken to the post-operative care unit in good condition, having tolerated the procedure well.  Post-Op Instructions: The patient will follow up at [facility] for a same day post-operative evaluation and will receive all other orders and instructions.

## 2023-02-27 NOTE — Anesthesia Preprocedure Evaluation (Signed)
Anesthesia Evaluation  Patient identified by MRN, date of birth, ID band Patient awake    Reviewed: Allergy & Precautions, H&P , NPO status , Patient's Chart, lab work & pertinent test results, reviewed documented beta blocker date and time   History of Anesthesia Complications (+) PONV and history of anesthetic complications  Airway Mallampati: II  TM Distance: >3 FB Neck ROM: full    Dental no notable dental hx.    Pulmonary neg pulmonary ROS, asthma , sleep apnea , COPD, Current Smoker   Pulmonary exam normal breath sounds clear to auscultation       Cardiovascular Exercise Tolerance: Good hypertension, negative cardio ROS  Rhythm:regular Rate:Normal     Neuro/Psych  PSYCHIATRIC DISORDERS Anxiety Depression    negative neurological ROS  negative psych ROS   GI/Hepatic negative GI ROS, Neg liver ROS,,,  Endo/Other  negative endocrine ROSdiabetesHypothyroidism    Renal/GU negative Renal ROS  negative genitourinary   Musculoskeletal   Abdominal   Peds  Hematology negative hematology ROS (+)   Anesthesia Other Findings   Reproductive/Obstetrics negative OB ROS                             Anesthesia Physical Anesthesia Plan  ASA: 3  Anesthesia Plan: MAC   Post-op Pain Management:    Induction:   PONV Risk Score and Plan:   Airway Management Planned:   Additional Equipment:   Intra-op Plan:   Post-operative Plan:   Informed Consent: I have reviewed the patients History and Physical, chart, labs and discussed the procedure including the risks, benefits and alternatives for the proposed anesthesia with the patient or authorized representative who has indicated his/her understanding and acceptance.     Dental Advisory Given  Plan Discussed with: CRNA  Anesthesia Plan Comments:        Anesthesia Quick Evaluation

## 2023-02-27 NOTE — Transfer of Care (Signed)
Immediate Anesthesia Transfer of Care Note  Patient: [name]  Procedure(s) Performed: CATARACT EXTRACTION PHACO AND INTRAOCULAR LENS PLACEMENT (IOC) (Left: Eye)  Patient Location: Short Stay  Anesthesia Type:MAC  Level of Consciousness: awake, alert , oriented, and patient cooperative  Airway & Oxygen Therapy: Patient Spontanous Breathing  Post-op Assessment: Report given to RN, Post -op Vital signs reviewed and stable, and Patient moving all extremities X 4  Post vital signs: Reviewed and stable  Last Vitals:  Vitals Value Taken Time  BP    Temp    Pulse    Resp    SpO2      Last Pain:  Vitals:   02/27/23 0932  TempSrc: Oral  PainSc: 0-No pain      Patients Stated Pain Goal: 5 (02/27/23 0932)  Complications: No notable events documented.

## 2023-02-27 NOTE — Anesthesia Postprocedure Evaluation (Signed)
Anesthesia Post Note  Patient: CADDIE TORRENCE  Procedure(s) Performed: CATARACT EXTRACTION PHACO AND INTRAOCULAR LENS PLACEMENT (IOC) (Left: Eye)  Patient location during evaluation: Phase II Anesthesia Type: MAC Level of consciousness: awake Pain management: pain level controlled Vital Signs Assessment: post-procedure vital signs reviewed and stable Respiratory status: spontaneous breathing and respiratory function stable Cardiovascular status: blood pressure returned to baseline and stable Postop Assessment: no headache and no apparent nausea or vomiting Anesthetic complications: no Comments: Late entry   No notable events documented.   Last Vitals:  Vitals:   02/27/23 0932 02/27/23 1112  BP: 129/85 103/76  Pulse: (!) 58 (!) 55  Resp: 18   Temp: 36.8 C 36.6 C  SpO2: 96% 96%    Last Pain:  Vitals:   02/27/23 1112  TempSrc: Oral  PainSc: 0-No pain                 Windell Norfolk

## 2023-02-27 NOTE — Discharge Instructions (Addendum)
Please discharge patient when stable, will follow up today with Dr. Wrzosek at the [facility] [place] office immediately following discharge.  Leave shield in place until visit.  All paperwork with discharge instructions will be given at the office.  [facility] Address:  [address]  [place], [place] [address]  

## 2023-03-03 ENCOUNTER — Encounter (HOSPITAL_COMMUNITY): Payer: Self-pay | Admitting: Ophthalmology

## 2023-05-19 ENCOUNTER — Telehealth (HOSPITAL_COMMUNITY): Payer: Self-pay

## 2023-05-19 NOTE — Telephone Encounter (Signed)
Appt confirmed by pt.

## 2023-05-19 NOTE — Telephone Encounter (Signed)
Lvm to confirm 05/21/23 appt by 12:00 05/20/23

## 2023-05-21 ENCOUNTER — Ambulatory Visit (INDEPENDENT_AMBULATORY_CARE_PROVIDER_SITE_OTHER): Payer: No Typology Code available for payment source | Admitting: Psychiatry

## 2023-05-21 DIAGNOSIS — F331 Major depressive disorder, recurrent, moderate: Secondary | ICD-10-CM | POA: Diagnosis not present

## 2023-05-21 DIAGNOSIS — F411 Generalized anxiety disorder: Secondary | ICD-10-CM

## 2023-05-21 DIAGNOSIS — F4310 Post-traumatic stress disorder, unspecified: Secondary | ICD-10-CM | POA: Diagnosis not present
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
## 2023-06-09 ENCOUNTER — Ambulatory Visit (HOSPITAL_COMMUNITY): Payer: 59 | Admitting: Psychiatry

## 2023-07-13 ENCOUNTER — Telehealth (HOSPITAL_COMMUNITY): Payer: Self-pay | Admitting: Psychiatry

## 2023-07-13 ENCOUNTER — Ambulatory Visit (HOSPITAL_COMMUNITY): Payer: 59 | Admitting: Psychiatry

## 2023-07-13 NOTE — Progress Notes (Unsigned)
 Marland Kitchen

## 2023-07-30 ENCOUNTER — Ambulatory Visit (INDEPENDENT_AMBULATORY_CARE_PROVIDER_SITE_OTHER): Payer: No Typology Code available for payment source | Admitting: Psychiatry

## 2023-07-30 DIAGNOSIS — F331 Major depressive disorder, recurrent, moderate: Secondary | ICD-10-CM | POA: Diagnosis not present
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
## 2023-08-13 ENCOUNTER — Encounter (HOSPITAL_COMMUNITY): Payer: Self-pay

## 2023-08-13 ENCOUNTER — Ambulatory Visit (INDEPENDENT_AMBULATORY_CARE_PROVIDER_SITE_OTHER): Payer: 59 | Admitting: Psychiatry

## 2023-08-13 DIAGNOSIS — F331 Major depressive disorder, recurrent, moderate: Secondary | ICD-10-CM
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
## 2023-09-08 ENCOUNTER — Ambulatory Visit (HOSPITAL_COMMUNITY): Payer: 59 | Admitting: Psychiatry

## 2023-09-17 ENCOUNTER — Telehealth (HOSPITAL_COMMUNITY): Payer: Self-pay

## 2023-09-17 NOTE — Telephone Encounter (Signed)
 Lvm to confirm 09/21/23 appt by 12 09/18/23

## 2023-09-17 NOTE — Telephone Encounter (Signed)
Pt called back and confirmed appt.

## 2023-09-21 ENCOUNTER — Ambulatory Visit (INDEPENDENT_AMBULATORY_CARE_PROVIDER_SITE_OTHER): Payer: 59 | Admitting: Registered Nurse

## 2023-09-21 ENCOUNTER — Encounter (HOSPITAL_COMMUNITY): Payer: Self-pay | Admitting: Registered Nurse

## 2023-09-21 VITALS — BP 138/77 | HR 74 | Ht 64.0 in | Wt 235.4 lb

## 2023-09-21 DIAGNOSIS — F411 Generalized anxiety disorder: Secondary | ICD-10-CM

## 2023-09-21 DIAGNOSIS — G4700 Insomnia, unspecified: Secondary | ICD-10-CM

## 2023-09-21 DIAGNOSIS — F4310 Post-traumatic stress disorder, unspecified: Secondary | ICD-10-CM

## 2023-09-21 DIAGNOSIS — F331 Major depressive disorder, recurrent, moderate: Secondary | ICD-10-CM | POA: Diagnosis not present

## 2023-09-21 MED ORDER — MIRTAZAPINE 7.5 MG PO TABS
7.5000 mg | ORAL_TABLET | Freq: Every day | ORAL | 0 refills | Status: DC
Start: 2023-09-21 — End: 2023-10-05

## 2023-09-21 MED ORDER — SERTRALINE HCL 25 MG PO TABS
25.0000 mg | ORAL_TABLET | Freq: Every day | ORAL | 1 refills | Status: DC
Start: 2023-09-21 — End: 2023-10-05
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
## 2023-09-21 NOTE — Patient Instructions (Addendum)
 Keep scheduled appointment with therapist, if unable to make visit call to reschedule  Labs have been ordered since it has been a while since you had any.  Labs are checked at least yearly and more frequently depending on prescribed medication.  Routine blood work is an important part of assessing a patient's overall health and to rule out any condition that is not psychiatric related.  Tests such as a complete blood count, metabolic panel, lipid panel, thyroid function tests, and hemoglobin A1c test (screening for diabetes) can help a psychiatrist understand the general medical health of a patient.  Blood work can help make informed decisions about a potential differential diagnosis and help understand other factors that may be the correct reason for the presentation.  In some cases, blood work can aid in the diagnosis, medication management, and/or treatment of your a mental health. Please have labs drawn prior to next scheduled visit.   Routine Labs checked at least yearly.  May need to be checked more often depending on prescribed medications.   CBC with Differential/Platelet    Comprehensive metabolic panel    Hemoglobin A1c    Magnesium    Ethanol    Urinalysis, Routine w reflex microscopic Urine, Clean Catch    Pregnancy, urine (females of child bearing age) POCT Urine Drug Screen:  If prescribed any controlled substance  Also recommend EKG prior to starting psychotropic medications as a baseline and periodically after starting psychotropic medications to monitor for prolong QTc which can indicate the presence of cardiac risk factors.  Studies have shown that some psychotropic medications can increase the risk of prolong QTc.  Please have your primary care provider to do EKG at your next scheduled visit and send results if not available on Epic.        Safety Plan [name] will reach out to her daughter, call 911, 25, mobile crisis, or present to nearest emergency room should she  experience any suicidal/homicidal ideation, auditory/visual/hallucinations, or detrimental worsening of her mental health.   [name] will follow up with Julie Andes, NP in 2 weeks for medication management.  Continue biweekly psychotherapy with Julie Reasons, LCSW (therapy).    The suicide prevention education provided includes the following: Suicide risk factors Suicide prevention and interventions National Suicide Hotline telephone number [facility] assessment telephone number [facility] Emergency Assistance [address] and/or Residential Mobile Crisis Unit telephone number Safety plan established with [name] and to share with her daughter. Remove weapons (e.g., guns, rifles, knives), all items previously/currently identified as safety concern.   Remove drugs/medications (over the counter, prescriptions, illicit drugs), all items previously/currently identified as a safety concern.   Mobile Crisis Response Teams Listed by counties in vicinity of [facility] providers [facility] [place] [facility] [phone] [facility] [phone] [facility] [phone] [facility] [phone] [place]                * [facility] [phone]                * [facility] [phone]  [facility] [phone] [facility] [employer] [employer]  [phone] * [facility] [phone]

## 2023-09-22 LAB — COMPREHENSIVE METABOLIC PANEL
AG Ratio: 0.9 (calc) — ABNORMAL LOW (ref 1.0–2.5)
ALT: 26 U/L (ref 6–29)
AST: 39 U/L — ABNORMAL HIGH (ref 10–35)
Albumin: 3.3 g/dL — ABNORMAL LOW (ref 3.6–5.1)
Alkaline phosphatase (APISO): 117 U/L (ref 37–153)
BUN: 12 mg/dL (ref 7–25)
CO2: 24 mmol/L (ref 20–32)
Calcium: 8.9 mg/dL (ref 8.6–10.4)
Chloride: 107 mmol/L (ref 98–110)
Creat: 0.56 mg/dL (ref 0.50–1.03)
Globulin: 3.6 g/dL (ref 1.9–3.7)
Glucose, Bld: 77 mg/dL (ref 65–99)
Potassium: 4.1 mmol/L (ref 3.5–5.3)
Sodium: 139 mmol/L (ref 135–146)
Total Bilirubin: 0.9 mg/dL (ref 0.2–1.2)
Total Protein: 6.9 g/dL (ref 6.1–8.1)
eGFR: 105 mL/min/{1.73_m2} (ref >=60)

## 2023-09-22 LAB — CBC WITH DIFFERENTIAL/PLATELET
Absolute Lymphocytes: 996 {cells}/uL (ref 850–3900)
Absolute Monocytes: 303 {cells}/uL (ref 200–950)
Basophils Absolute: 31 {cells}/uL (ref 0–200)
Basophils Relative: 0.9 %
Eosinophils Absolute: 112 {cells}/uL (ref 15–500)
Eosinophils Relative: 3.3 %
HCT: 40.1 % (ref 35.0–45.0)
Hemoglobin: 13.7 g/dL (ref 11.7–15.5)
MCH: 34.2 pg — ABNORMAL HIGH (ref 27.0–33.0)
MCHC: 34.2 g/dL (ref 32.0–36.0)
MCV: 100 fL (ref 80.0–100.0)
MPV: 10.8 fL (ref 7.5–12.5)
Monocytes Relative: 8.9 %
Neutro Abs: 1958 {cells}/uL (ref 1500–7800)
Neutrophils Relative %: 57.6 %
Platelets: 97 10*3/uL — ABNORMAL LOW (ref 140–400)
RBC: 4.01 10*6/uL (ref 3.80–5.10)
RDW: 13 % (ref 11.0–15.0)
Total Lymphocyte: 29.3 %
WBC: 3.4 10*3/uL — ABNORMAL LOW (ref 3.8–10.8)

## 2023-09-22 LAB — LIPID PANEL
Cholesterol: 221 mg/dL — ABNORMAL HIGH (ref <200)
HDL: 62 mg/dL (ref >=50)
LDL Cholesterol (Calc): 144 mg/dL — ABNORMAL HIGH
Non-HDL Cholesterol (Calc): 159 mg/dL — ABNORMAL HIGH (ref <130)
Total CHOL/HDL Ratio: 3.6 (calc) (ref <5.0)
Triglycerides: 60 mg/dL (ref <150)

## 2023-09-22 LAB — HEMOGLOBIN A1C
Hgb A1c MFr Bld: 5.4 %{Hb} (ref <5.7)
Mean Plasma Glucose: 108 mg/dL
eAG (mmol/L): 6 mmol/L

## 2023-09-22 LAB — TSH: TSH: 0.13 m[IU]/L — ABNORMAL LOW (ref 0.40–4.50)

## 2023-09-29 ENCOUNTER — Ambulatory Visit (INDEPENDENT_AMBULATORY_CARE_PROVIDER_SITE_OTHER): Payer: 59 | Admitting: Psychiatry

## 2023-09-29 DIAGNOSIS — F331 Major depressive disorder, recurrent, moderate: Secondary | ICD-10-CM
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
## 2023-10-05 ENCOUNTER — Encounter (HOSPITAL_COMMUNITY): Payer: Self-pay | Admitting: Registered Nurse

## 2023-10-05 ENCOUNTER — Ambulatory Visit (INDEPENDENT_AMBULATORY_CARE_PROVIDER_SITE_OTHER): Admitting: Registered Nurse

## 2023-10-05 VITALS — BP 110/69 | HR 69 | Ht 64.0 in | Wt 228.8 lb

## 2023-10-05 DIAGNOSIS — F411 Generalized anxiety disorder: Secondary | ICD-10-CM | POA: Diagnosis not present

## 2023-10-05 DIAGNOSIS — F331 Major depressive disorder, recurrent, moderate: Secondary | ICD-10-CM | POA: Diagnosis not present

## 2023-10-05 DIAGNOSIS — F4310 Post-traumatic stress disorder, unspecified: Secondary | ICD-10-CM | POA: Diagnosis not present

## 2023-10-05 DIAGNOSIS — G4700 Insomnia, unspecified: Secondary | ICD-10-CM

## 2023-10-05 MED ORDER — SERTRALINE HCL 25 MG PO TABS
25.0000 mg | ORAL_TABLET | Freq: Every day | ORAL | 1 refills | Status: DC
Start: 2023-10-05 — End: 2023-10-22

## 2023-10-05 MED ORDER — MIRTAZAPINE 15 MG PO TABS: 15.0000 mg | ORAL_TABLET | Freq: Every day | ORAL | 1 refills | Status: DC

## 2023-10-05 NOTE — Patient Instructions (Signed)

## 2023-10-05 NOTE — Progress Notes (Signed)
 [facility] MD/PA/NP OP Progress Note  10/05/2023 11:48 AM [name]  MRN:  [id number]  Chief Complaint:  Chief Complaint  Patient presents with   Follow-up    Medication management   HPI: [name] 58 y.o. female presents to office today for medication management follow up.  She is seen face to face by this provider, and chart reviewed on 10/05/23.  Her psychiatric history is significant for major depression, general anxiety, PTSD, and insomnia.  Her mental health is currently managed with Zoloft 25 mg daily, and Remeron 7.5 mg Q hs.  She states since starting the Zoloft she has felt nauseous and diarrhea.  Reports no adverse reaction to Remeron.  Instructed to take the Zoloft at bedtime to see if there is any improvement with nausea.  She also reports when she first takes the Zoloft "For about 1-2 hours I feel numb all over, like I can't move.  She reports that she is sleeping a little better but there has been no improvement in depression, anxiety, or mood.  "I feel like when I take the medicine (Zoloft) it's going straight out of my body when go to the bathroom (diarrhea).  Reports she is eating without difficulty.  "I feel nauseous but not throwing up."         Recommended the following:  Continue Zoloft 25 mg daily.  Instructed to take at night to see if there is improvement of nausea.  Follow up with PCP on diarrhea if no improvement.  Informed ir continue to have adverse reaction will switch to Prozac.  She is to call next week if no improvement.  Will send in an order for Prozac 10 mg daily for one week then increase to 20 mg daily.   Increase Remeron 15 mg Q hs.  She voices understanding with information being given to her today and is agreeable to recommendations.    Visit Diagnosis:    ICD-10-CM   1. MDD (major depressive disorder), recurrent episode, moderate (HCC)  F33.1 sertraline (ZOLOFT) 25 MG tablet    mirtazapine (REMERON) 15 MG tablet    2. GAD (generalized anxiety  disorder)  F41.1 sertraline (ZOLOFT) 25 MG tablet    3. PTSD (post-traumatic stress disorder)  F43.10 sertraline (ZOLOFT) 25 MG tablet    4. Insomnia, unspecified type  G47.00 mirtazapine (REMERON) 15 MG tablet      Past Psychiatric History: major depression, general anxiety, PTSD, and insomnia.   Past Medical History:  Past Medical History:  Diagnosis Date   Anxiety    Arthritis    Asthma    Blind    Chronic respiratory failure (HCC)    COPD (chronic obstructive pulmonary disease) (HCC)    Depression    Diabetes mellitus without complication (HCC)    Glaucoma    Hyperlipidemia    Hypertension    Hypothyroidism    PONV (postoperative nausea and vomiting)    Sleep apnea    12   Thyroid disease     Past Surgical History:  Procedure Laterality Date   ANKLE SURGERY Right 2007   CATARACT EXTRACTION W/PHACO Left 02/27/2023   Procedure: CATARACT EXTRACTION PHACO AND INTRAOCULAR LENS PLACEMENT (IOC);  Surgeon: Fabio Pierce, MD;  Location: [facility];  Service: Ophthalmology;  Laterality: Left;  CDE 9.85   ENDOMETRIAL ABLATION     ORIF ANKLE FRACTURE Left 12/01/2012   Procedure: OPEN REDUCTION INTERNAL FIXATION (ORIF) ANKLE FRACTURE;  Surgeon: Darreld Mclean, MD;  Location: [facility];  Service: Orthopedics;  Laterality: Left;   TONSILLECTOMY     TUBAL LIGATION     TUBAL LIGATION      Family Psychiatric History: See below in family history  Family History:  Family History  Problem Relation Age of Onset   Cancer Mother    Heart failure Father    Depression Daughter    Other Son        MVA    Social History:  Social History   Socioeconomic History   Marital status: Widowed    Spouse name: Not on file   Number of children: 2   Years of education: 42   Highest education level: Not on file  Occupational History   Occupation: N/A  Tobacco Use   Smoking status: Every Day    Current packs/day: 0.50    Average packs/day: 0.5 packs/day for 39.0 years (19.5 ttl pk-yrs)     Types: Cigarettes   Smokeless tobacco: Never  Vaping Use   Vaping status: Never Used  Substance and Sexual Activity   Alcohol use: No   Drug use: No   Sexual activity: Not Currently    Birth control/protection: Surgical    Comment: tubal  Other Topics Concern   Not on file  Social History Narrative   Lives at home w/ roommates   Right-handed   Caffeine: occasional coffee   Social Drivers of Corporate investment banker Strain: Low Risk  (01/17/2021)   [facility], [facility]   Overall Financial Resource Strain (CARDIA)    Difficulty of Paying Living Expenses: Not hard at all  Food Insecurity: No Food Insecurity (01/17/2021)   [facility] from [facility], [facility]   Hunger Vital Sign    Worried About Running Out of Food in the Last Year: Never true    Ran Out of Food in the Last Year: Never true  Transportation Needs: No Transportation Needs (01/17/2021)   Received from [facility], [facility]   [facility] - Transportation    Lack of Transportation (Medical): No    Lack of Transportation (Non-Medical): No  Physical Activity: Sufficiently Active (01/17/2021)   Received from [facility], [facility]   Exercise Vital Sign    Days of Exercise per Week: 7 days    Minutes of Exercise per Session: 30 min  Stress: No Stress Concern Present (01/17/2021)   Received from [facility], [facility] of Occup[facility] - Occupational Stress Questionnaire    Feeling of Stress : Not at all  Social Connections: Moderately Isolated (01/17/2021)   Received from [facility], [facility]   Social Connection and Isolation Panel [NHANES]    Frequency of Communication with Friends and Family: More than three times a week    Frequency of Social Gatherings with Friends and Family: Once a week    Attends Religious Services: More than 4 times per year    Active Member of [employer] or Organizations: No    Attends Tax inspector Meetings: Never    Marital Status: Widowed    Allergies:  Allergies  Allergen Reactions   Metformin Diarrhea    Metabolic Disorder Labs:  Results reviewed with patient.  Referral to PCP for adjustment of thyroid medications.  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  PCP aware and monitoring. Lab Results  Component Value Date   HGBA1C 5.4 09/21/2023   MPG 108 09/21/2023   MPG  143 04/22/2018   No results found for: "PROLACTIN" Lab Results  Component Value Date   CHOL 221 (H) 09/21/2023   TRIG 60 09/21/2023   HDL 62 09/21/2023   CHOLHDL 3.6 09/21/2023   LDLCALC 144 (H) 09/21/2023   Lab Results  Component Value Date   TSH 0.13 (L) 09/21/2023   TSH 3.472 12/01/2012    Current Medications: Current Outpatient Medications  Medication Sig Dispense Refill   latanoprost (XALATAN) 0.005 % ophthalmic solution Place 1 drop into both eyes at bedtime.      levothyroxine (SYNTHROID) 150 MCG tablet Take 100 mcg by mouth daily before breakfast.     atorvastatin (LIPITOR) 80 MG tablet Take 80 mg by mouth daily.  (Patient not taking: Reported on 05/21/2023)     insulin degludec (TRESIBA) 100 UNIT/ML FlexTouch Pen Inject 40 Units into the skin daily.  (Patient not taking: Reported on 05/21/2023)     mirtazapine (REMERON) 15 MG tablet Take 1 tablet (15 mg total) by mouth at bedtime. 30 tablet 1   QUEtiapine (SEROQUEL) 25 MG tablet Take 25 mg by mouth at bedtime. (Patient not taking: Reported on 10/05/2023)     sertraline (ZOLOFT) 25 MG tablet Take 1 tablet (25 mg total) by mouth daily. 30 tablet 1   telmisartan (MICARDIS) 40 MG tablet Take 40 mg by mouth daily. (Patient not taking: Reported on 05/21/2023)     No current facility-administered medications for this visit.     Musculoskeletal: Strength & Muscle Tone: within normal limits Gait & Station: normal Patient leans: N/A  Psychiatric Specialty Exam: Review of Systems  Constitutional:        No other  complaints voiced at this time  All other systems reviewed and are negative.   Blood pressure 110/69, pulse 69, height 5\' 4"  (1.626 m), weight 228 lb 12.8 oz (103.8 kg), SpO2 91%.Body mass index is 39.27 kg/m.  General Appearance: Casual and Neat  Eye Contact:  Good  Speech:  Clear and Coherent and Normal Rate  Volume:  Normal  Mood:  Anxious and Dysphoric  Affect:  Congruent  Thought Process:  Coherent, Goal Directed, and Descriptions of Associations: Intact  Orientation:  Full (Time, Place, and Person)  Thought Content: WDL and Logical   Suicidal Thoughts:  No  Homicidal Thoughts:  No  Memory:  Immediate;   Good Recent;   Good Remote;   Good  Judgement:  Intact  Insight:  Present  Psychomotor Activity:  Normal  Concentration:  Concentration: Good and Attention Span: Good  Recall:  Good  Fund of Knowledge: Good  Language: Good  Akathisia:  No  Handed:  Right  AIMS (if indicated): not done  Assets:  Communication Skills Desire for Improvement Financial Resources/Insurance Housing Leisure Time Resilience Social Support Transportation  ADL's:  Intact  Cognition: WNL  Sleep:  Good, Improved   Screenings: Geneticist, molecular Office Visit from 09/21/2023 in [facility] [place]  AIMS Total Score 0      GAD-7    Flowsheet Row Office Visit from 09/21/2023 in [facility] [place] Counselor from 07/30/2023 in [facility] [place] Counselor from 05/21/2023 in [facility] [place]  Total GAD-7 Score 21 21 21       PHQ2-9    Flowsheet Row Office Visit from 09/21/2023 in [facility] [place] Counselor from 08/13/2023 in [facility] at [place]  Counselor from 07/30/2023 in [facility] [place] Counselor from 05/21/2023 in [facility] at [place] Counselor from 10/31/2021 in [facility] at [facility] Total Score 6 6 6 5 4   PHQ-9 Total Score 19 21 17 20 15       Flowsheet Row Office Visit from 09/21/2023 in [facility] [place] Counselor from 05/21/2023 in [facility] at [place] Counselor from 10/31/2021 in [facility] at [place]  C-SSRS RISK CATEGORY Low Risk Low Risk No Risk        Assessment and Plan: Assessment: Patient seen and examined as noted above. Summary: Today [name] appears to be doing fairly well.  However she reports no improvement in depression, anxiety, or mood.  She states there has been some improvement in her sleep "I'm sleeping at least 6 hours a night not."   Reporting adverse reaction to Zoloft (nausea, diarrhea).  Discussed switching to Prozac if no improvement.  She denies suicidal/self-harm/homicidal ideation, psychosis, paranoia, and abnormal movement.    During visit she is dressed appropriate for age and weather.  She is comfortably in chair with no noted distress.  She is alert/oriented x 4, calm/cooperative and mood is congruent with affect.  She spoke in a clear tone at moderate volume, and normal pace, with good eye contact.  Her thought process is coherent, relevant, and there is no indication that she is currently responding to internal/external stimuli or experiencing delusional thought content.    1. MDD (major depressive disorder), recurrent episode, moderate (HCC) (Primary) Reporting no improvement in depression, anxiety, or mood.   Continue- sertraline (ZOLOFT) 25 MG tablet; Take 1 tablet (25 mg total) by mouth daily.  Dispense: 30 tablet; Refill: 1 Increased- mirtazapine (REMERON) 15 MG tablet; Take 1 tablet (15 mg total) by mouth at bedtime.  Dispense: 30 tablet; Refill: 1  2. GAD (generalized anxiety disorder) Reporting no  improvement in depression, anxiety, or mood.  Continue- sertraline (ZOLOFT) 25 MG tablet; Take 1 tablet (25 mg total) by mouth daily.  Dispense: 30 tablet; Refill: 1  3. PTSD (post-traumatic stress disorder) No complaints at this time Continue- sertraline (ZOLOFT) 25 MG tablet; Take 1 tablet (25 mg total) by mouth daily.  Dispense: 30 tablet; Refill: 1  4. Insomnia, unspecified type Reporting some improvement in sleep Increased- mirtazapine (REMERON) 15 MG tablet; Take 1 tablet (15 mg total) by mouth at bedtime.  Dispense: 30 tablet; Refill: 1   Plan: Medications:  If no improvement is adverse reactions to Zoloft.  Discussed changing to Prozac 10 mg daily x 1 week, then increasing to 20 mg daily.  Understanding voiced.  She is to call next week to reports if improvement.   Meds ordered this encounter  Medications   sertraline (ZOLOFT) 25 MG tablet    Sig: Take 1 tablet (25 mg total) by mouth daily.    Dispense:  30 tablet    Refill:  1    Supervising Provider:   Lolly Mustache, SYED T [[id number]]   mirtazapine (REMERON) 15 MG tablet    Sig: Take 1 tablet (15 mg total) by mouth at bedtime.    Dispense:  30 tablet    Refill:  1    Supervising Provider:   Kathryne Sharper T [[id number]]    Labs:  Reviewed with patient.  Referral to PCP for adjustment of thyroid medications.  Reports history of low platelets related  to liver damage.  PCP aware and monitoring.  Other:  Continue counseling/therapy.   SARAHANN HORRELL is instructed to call 911, [phone], mobile crisis, or present to the nearest emergency room should she experience any suicidal/homicidal ideation, auditory/visual/hallucinations, or detrimental worsening of her mental health condition.   KATNISS WEEDMAN has participated in the development of this treatment plan and verbalized her agreement with plan as listed.  Follow Up: Call in 1 week if no improvement in adverse reactions to Zoloft.  Return in 2 weeks medication follow up.   Call in the interim  for any side-effects, decompensation, questions, or problems  Collaboration of Care: Collaboration of Care: Medication Management AEB Medication adjustment and refills  Patient/Guardian was advised Release of Information must be obtained prior to any record release in order to collaborate their care with an outside provider. Patient/Guardian was advised if they have not already done so to contact the registration department to sign all necessary forms in order for [place] to release information regarding their care.   Consent: Patient/Guardian gives verbal consent for treatment and assignment of benefits for services provided during this visit. Patient/Guardian expressed understanding and agreed to proceed.    Van Seymore, NP 10/05/2023, 11:48 AM

## 2023-10-13 ENCOUNTER — Ambulatory Visit (HOSPITAL_COMMUNITY): Payer: 59 | Admitting: Psychiatry

## 2023-10-14 ENCOUNTER — Other Ambulatory Visit (HOSPITAL_COMMUNITY): Payer: Self-pay | Admitting: Registered Nurse

## 2023-10-14 DIAGNOSIS — F4310 Post-traumatic stress disorder, unspecified: Secondary | ICD-10-CM

## 2023-10-14 DIAGNOSIS — F411 Generalized anxiety disorder: Secondary | ICD-10-CM

## 2023-10-14 DIAGNOSIS — F331 Major depressive disorder, recurrent, moderate: Secondary | ICD-10-CM

## 2023-10-22 ENCOUNTER — Encounter (HOSPITAL_COMMUNITY): Payer: Self-pay | Admitting: Registered Nurse

## 2023-10-22 ENCOUNTER — Telehealth (INDEPENDENT_AMBULATORY_CARE_PROVIDER_SITE_OTHER): Admitting: Registered Nurse

## 2023-10-22 DIAGNOSIS — G4700 Insomnia, unspecified: Secondary | ICD-10-CM

## 2023-10-22 DIAGNOSIS — F331 Major depressive disorder, recurrent, moderate: Secondary | ICD-10-CM | POA: Diagnosis not present

## 2023-10-22 MED ORDER — FLUOXETINE HCL 20 MG PO CAPS: 20.0000 mg | ORAL_CAPSULE | Freq: Every day | ORAL | 0 refills | Status: DC

## 2023-10-22 MED ORDER — MIRTAZAPINE 15 MG PO TABS
15.0000 mg | ORAL_TABLET | Freq: Every day | ORAL | 1 refills | Status: DC
Start: 2023-10-22 — End: 2023-12-17

## 2023-10-22 NOTE — Progress Notes (Signed)
 [facility] MD/PA/NP OP Progress Note  10/22/2023 9:11 AM [name]  MRN:  [id number]   Virtual Visit via Video Note  I connected with [name] on 10/22/23 at  9:00 AM EDT by a video enabled telemedicine application and verified that I am speaking with the correct person using two identifiers.  Location: Patient: Home Provider: Home office   I discussed the limitations of evaluation and management by telemedicine and the availability of in person appointments. The patient expressed understanding and agreed to proceed.  I discussed the assessment and treatment plan with the patient. The patient was provided an opportunity to ask questions and all were answered. The patient agreed with the plan and demonstrated an understanding of the instructions.   The patient was advised to call back or seek an in-person evaluation if the symptoms worsen or if the condition fails to improve as anticipated.  I provided 20 minutes of non-face-to-face time during this encounter.   Humberto Magnus, NP   Chief Complaint:  Chief Complaint  Patient presents with   Follow-up    Medication management   HPI: [name] 58 y.o. female presents to office today for medication management follow up.  She is seen face to face by this provider, and chart reviewed on 10/22/23.  Her psychiatric history is significant for major depression, general anxiety, PTSD, and insomnia.  Her mental health is currently managed with Zoloft  25 mg daily, and Remeron  7.5 mg Q hs.  She continues to report that she is having nausea, vomiting, and diarrhea from the Zoloft  which has cause worsening in anxiety "I feel like I got something in my stomach and I feel like I just want to lash out.  I started taking it at night like you said and I'm up all night until it is out of my system.  Like last night I was up to the bathroom from 3 am to 7 am.  Once it is out of my system I'm fine.  Like I'm fine now."  She states everything  has been fine but "that."  Reports she was sleeping fine until she started the Zoloft  at night.   Today she denies suicidal/self-harm/homicidal ideation, psychosis, paranoia, and abnormal movements.        Recommended the following:  Discontinue Zoloft  25 mg daily, Continue Remeron  15 mg at bedtime, and Start Prozac  20 mg Daily.  Instructed her to also follow up with PCP related to nausea/vomiting and diarrhea to make sure nothing else is going on.  Understanding and agreement with recommendations voiced.     Visit Diagnosis:    ICD-10-CM   1. MDD (major depressive disorder), recurrent episode, moderate (HCC)  F33.1     2. Insomnia, unspecified type  G47.00       Past Psychiatric History: major depression, general anxiety, PTSD, and insomnia.   Past Medical History:  Past Medical History:  Diagnosis Date   Anxiety    Arthritis    Asthma    Blind    Chronic respiratory failure (HCC)    COPD (chronic obstructive pulmonary disease) (HCC)    Depression    Diabetes mellitus without complication (HCC)    Glaucoma    Hyperlipidemia    Hypertension    Hypothyroidism    PONV (postoperative nausea and vomiting)    Sleep apnea    12   Thyroid disease     Past Surgical History:  Procedure Laterality Date   ANKLE SURGERY Right 2007  CATARACT EXTRACTION W/PHACO Left 02/27/2023   Procedure: CATARACT EXTRACTION PHACO AND INTRAOCULAR LENS PLACEMENT (IOC);  Surgeon: Tarri Farm, MD;  Location: [facility];  Service: Ophthalmology;  Laterality: Left;  CDE 9.85   ENDOMETRIAL ABLATION     ORIF ANKLE FRACTURE Left 12/01/2012   Procedure: OPEN REDUCTION INTERNAL FIXATION (ORIF) ANKLE FRACTURE;  Surgeon: Pleasant Brilliant, MD;  Location: [facility];  Service: Orthopedics;  Laterality: Left;   TONSILLECTOMY     TUBAL LIGATION     TUBAL LIGATION      Family Psychiatric History: See below in family history  Family History:  Family History  Problem Relation Age of Onset   Cancer Mother    Heart  failure Father    Depression Daughter    Other Son        MVA    Social History:  Social History   Socioeconomic History   Marital status: Widowed    Spouse name: Not on file   Number of children: 2   Years of education: 26   Highest education level: Not on file  Occupational History   Occupation: N/A  Tobacco Use   Smoking status: Every Day    Current packs/day: 0.50    Average packs/day: 0.5 packs/day for 39.0 years (19.5 ttl pk-yrs)    Types: Cigarettes   Smokeless tobacco: Never  Vaping Use   Vaping status: Never Used  Substance and Sexual Activity   Alcohol use: No   Drug use: No   Sexual activity: Not Currently    Birth control/protection: Surgical    Comment: tubal  Other Topics Concern   Not on file  Social History Narrative   Lives at home w/ roommates   Right-handed   Caffeine: occasional coffee   Social Drivers of Corporate investment banker Strain: Low Risk  (01/17/2021)   [facility], [facility]   Overall Financial Resource Strain (CARDIA)    Difficulty of Paying Living Expenses: Not hard at all  Food Insecurity: No Food Insecurity (01/17/2021)   Received from [facility], [facility]   Hunger Vital Sign    Worried About Running Out of Food in the Last Year: Never true    Ran Out of Food in the Last Year: Never true  Transportation Needs: No Transportation Needs (01/17/2021)   Received from [facility], [facility]   [facility] - Transportation    Lack of Transportation (Medical): No    Lack of Transportation (Non-Medical): No  Physical Activity: Sufficiently Active (01/17/2021)   Received from [facility], [facility]   Exercise Vital Sign    Days of Exercise per Week: 7 days    Minutes of Exercise per Session: 30 min  Stress: No Stress Concern Present (01/17/2021)   Received from [facility], [facility] of Occupational Health - Occupational Stress Questionnaire    Feeling  of Stress : Not at all  Social Connections: Moderately Isolated (01/17/2021)   Received from [facility], [facility]   Social Connection and Isolation Panel [NHANES]    Frequency of Communication with Friends and Family: More than three times a week    Frequency of Social Gatherings with Friends and Family: Once a week    Attends Religious Services: More than 4 times per year    Active Member of [employer] or Organizations: No    Attends Banker Meetings: Never  Marital Status: Widowed    Allergies:  Allergies  Allergen Reactions   Zoloft  [Sertraline  Hcl] Diarrhea and Nausea And Vomiting   Metformin Diarrhea    Metabolic Disorder Labs:  Results reviewed with patient.  Referral to PCP for adjustment of thyroid medications.  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  PCP aware and monitoring. Lab Results  Component Value Date   HGBA1C 5.4 09/21/2023   MPG 108 09/21/2023   MPG 143 04/22/2018   No results found for: "PROLACTIN" Lab Results  Component Value Date   CHOL 221 (H) 09/21/2023   TRIG 60 09/21/2023   HDL 62 09/21/2023   CHOLHDL 3.6 09/21/2023   LDLCALC 144 (H) 09/21/2023   Lab Results  Component Value Date   TSH 0.13 (L) 09/21/2023   TSH 3.472 12/01/2012    Current Medications: Current Outpatient Medications  Medication Sig Dispense Refill   mirtazapine  (REMERON ) 15 MG tablet Take 1 tablet (15 mg total) by mouth at bedtime. 30 tablet 1   atorvastatin (LIPITOR) 80 MG tablet Take 80 mg by mouth daily.  (Patient not taking: Reported on 05/21/2023)     insulin degludec (TRESIBA) 100 UNIT/ML FlexTouch Pen Inject 40 Units into the skin daily.  (Patient not taking: Reported on 05/21/2023)     latanoprost (XALATAN) 0.005 % ophthalmic solution Place 1 drop into both eyes at bedtime.      levothyroxine  (SYNTHROID ) 150 MCG tablet Take 100 mcg by mouth daily before breakfast.     telmisartan (MICARDIS) 40 MG tablet Take 40 mg by mouth daily.  (Patient not taking: Reported on 05/21/2023)     No current facility-administered medications for this visit.     Musculoskeletal: Strength & Muscle Tone: within normal limits Gait & Station: normal Patient leans: N/A  Psychiatric Specialty Exam: Review of Systems  Constitutional:        No other complaints voiced at this time  Gastrointestinal:  Positive for diarrhea, nausea and vomiting.       GI upset related to Zoloft  (nausea/vomiting and dirrhea)  Psychiatric/Behavioral:  Positive for dysphoric mood. Negative for hallucinations, self-injury and suicidal ideas. Sleep disturbance: Related to N/V, diarrhea.The patient is nervous/anxious.   All other systems reviewed and are negative.   There were no vitals taken for this visit.There is no height or weight on file to calculate BMI.  General Appearance: Casual and Neat  Eye Contact:  Good  Speech:  Clear and Coherent and Normal Rate  Volume:  Normal  Mood:  Anxious and Dysphoric  Affect:  Congruent  Thought Process:  Coherent, Goal Directed, and Descriptions of Associations: Intact  Orientation:  Full (Time, Place, and Person)  Thought Content: WDL and Logical   Suicidal Thoughts:  No  Homicidal Thoughts:  No  Memory:  Immediate;   Good Recent;   Good Remote;   Good  Judgement:  Intact  Insight:  Present  Psychomotor Activity:  Normal  Concentration:  Concentration: Good and Attention Span: Good  Recall:  Good  Fund of Knowledge: Good  Language: Good  Akathisia:  No  Handed:  Right  AIMS (if indicated): not done  Assets:  Communication Skills Desire for Improvement Financial Resources/Insurance Housing Leisure Time Resilience Social Support Transportation  ADL's:  Intact  Cognition: WNL  Sleep:  Good, Improved   Screenings: AIMS    Flowsheet Row Office Visit from 09/21/2023 in [facility]  AIMS Total Score 0      GAD-7  Flowsheet Row Office Visit from  09/21/2023 in [facility] [place] Counselor from 07/30/2023 in [facility] at [place] Counselor from 05/21/2023 in [facility] at [place]  Total GAD-7 Score 21 21 21       PHQ2-9    Flowsheet Row Office Visit from 09/21/2023 in [facility] at [place] Counselor from 08/13/2023 in [facility] at [place] Counselor from 07/30/2023 in [facility] at [place] Counselor from 05/21/2023 in [facility] at [place] Counselor from 10/31/2021 in [facility] at [facility] Total Score 6 6 6 5 4   PHQ-9 Total Score 19 21 17 20 15       Flowsheet Row Office Visit from 09/21/2023 in [place] [facility] [place] Counselor from 05/21/2023 in [facility] [place] Counselor from 10/31/2021 in [facility] at [place]  C-SSRS RISK CATEGORY Low Risk Low Risk No Risk        Assessment and Plan: Assessment: Patient seen and examined as noted above. Summary: Today [name] continued adverse reaction to Zoloft  N/V and diarrhea.  Reporting she feels fine other wise but since started taking Zoloft  at night interfering with sleep having to get up related to N/ and diarrhea.  Reports depression remains the same but worsening in anxiety related to adverse reaction to Zoloft .   She denies suicidal/self-harm/homicidal ideation, psychosis, paranoia, and abnormal movement.    During visit she is dressed appropriate for age and weather.  She is seated comfortably in front of camera with no noted distress.  She is alert/oriented x 4, calm/cooperative and mood is congruent with affect.  She spoke in a clear tone at moderate volume, and normal pace, with good eye contact.  Her thought process  is coherent, relevant, and there is no indication that she is currently responding to internal/external stimuli or experiencing delusional thought content.    1. MDD (major depressive disorder), recurrent episode, moderate (HCC) Discontinued Zoloft  25 mg daily Continue- mirtazapine  (REMERON ) 15 MG tablet; Take 1 tablet (15 mg total) by mouth at bedtime.  Dispense: 30 tablet; Refill: 1 Start- FLUoxetine  (PROZAC ) 20 MG capsule; Take 1 capsule (20 mg total) by mouth daily.  Dispense: 30 capsule; Refill: 0  2. Insomnia, unspecified type Continue- mirtazapine  (REMERON ) 15 MG tablet; Take 1 tablet (15 mg total) by mouth at bedtime.  Dispense: 30 tablet; Refill: 1    Plan: Medications:  If no improvement is adverse reactions to Zoloft .  Discussed changing to Prozac  10 mg daily x 1 week, then increasing to 20 mg daily.  Understanding voiced.  She is to call next week to reports if improvement.   No orders of the defined types were placed in this encounter.   Labs:  Reviewed with patient.  Referral to PCP for adjustment of thyroid medications.  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  PCP aware and monitoring.  Other:  Continue counseling/therapy.   [name] is instructed to call 911, [phone], mobile crisis, or present to the nearest emergency room should she experience any suicidal/homicidal ideation, auditory/visual/hallucinations, or detrimental worsening of her mental health condition.   [name] has participated in the development of this treatment plan and verbalized her agreement with plan as listed.  Follow Up: 2 weeks for medication management Call in the interim for any side-effects, decompensation,  questions, or problems  Collaboration of Care: Collaboration of Care: Medication Management AEB Medication adjustment and refills and Primary Care Provider AEB Referral to PCP abnormal labs and for N/V,and diarrhea  Patient/Guardian was advised Release of  Information must be obtained prior to any record release in order to collaborate their care with an outside provider. Patient/Guardian was advised if they have not already done so to contact the registration department to sign all necessary forms in order for us  to release information regarding their care.   Consent: Patient/Guardian gives verbal consent for treatment and assignment of benefits for services provided during this visit. Patient/Guardian expressed understanding and agreed to proceed.    Allyah Heather, NP 10/22/2023, 9:11 AM

## 2023-10-22 NOTE — Patient Instructions (Signed)
 Follow up with your Primary doctor on recent Labs.    Call 911, [phone], mobile crisis, or present to the nearest emergency room should you experience any suicidal/homicidal ideation, auditory/visual/hallucinations, or detrimental worsening of your mental health.  Mobile Crisis Response Teams Listed by counties in vicinity of [facility] providers [facility] [phone] [facility] [phone] [facility] [phone] [facility] [phone] [place]                * [facility] [phone]                * [employer] [phone]  [facility] [phone] [facility] [employer]  [phone] * [employer] [phone]

## 2023-11-04 ENCOUNTER — Ambulatory Visit (INDEPENDENT_AMBULATORY_CARE_PROVIDER_SITE_OTHER): Admitting: Psychiatry

## 2023-11-04 DIAGNOSIS — F411 Generalized anxiety disorder: Secondary | ICD-10-CM | POA: Diagnosis not present

## 2023-11-04 DIAGNOSIS — F331 Major depressive disorder, recurrent, moderate: Secondary | ICD-10-CM | POA: Diagnosis not present
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
## 2023-11-14 ENCOUNTER — Other Ambulatory Visit (HOSPITAL_COMMUNITY): Payer: Self-pay | Admitting: Registered Nurse

## 2023-11-14 DIAGNOSIS — F331 Major depressive disorder, recurrent, moderate: Secondary | ICD-10-CM

## 2023-11-23 ENCOUNTER — Emergency Department (HOSPITAL_COMMUNITY)

## 2023-11-23 ENCOUNTER — Encounter (HOSPITAL_COMMUNITY): Payer: Self-pay | Admitting: Emergency Medicine

## 2023-11-23 ENCOUNTER — Other Ambulatory Visit: Payer: Self-pay

## 2023-11-23 ENCOUNTER — Emergency Department (HOSPITAL_COMMUNITY)
Admission: EM | Admit: 2023-11-23 | Discharge: 2023-11-23 | Disposition: A | Discharge status: Home / Self Care | Attending: Emergency Medicine | Admitting: Emergency Medicine

## 2023-11-23 DIAGNOSIS — J449 Chronic obstructive pulmonary disease, unspecified: Secondary | ICD-10-CM | POA: Insufficient documentation

## 2023-11-23 DIAGNOSIS — K429 Umbilical hernia without obstruction or gangrene: Secondary | ICD-10-CM | POA: Diagnosis not present

## 2023-11-23 DIAGNOSIS — D72819 Decreased white blood cell count, unspecified: Secondary | ICD-10-CM | POA: Diagnosis not present

## 2023-11-23 DIAGNOSIS — R198 Other specified symptoms and signs involving the digestive system and abdomen: Secondary | ICD-10-CM

## 2023-11-23 DIAGNOSIS — I10 Essential (primary) hypertension: Secondary | ICD-10-CM | POA: Diagnosis not present

## 2023-11-23 DIAGNOSIS — D696 Thrombocytopenia, unspecified: Secondary | ICD-10-CM | POA: Diagnosis not present

## 2023-11-23 DIAGNOSIS — E119 Type 2 diabetes mellitus without complications: Secondary | ICD-10-CM | POA: Insufficient documentation

## 2023-11-23 LAB — CBC WITH DIFFERENTIAL/PLATELET
Abs Immature Granulocytes: 0.01 10*3/uL (ref 0.00–0.07)
Basophils Absolute: 0 10*3/uL (ref 0.0–0.1)
Basophils Relative: 1 %
Eosinophils Absolute: 0.1 10*3/uL (ref 0.0–0.5)
Eosinophils Relative: 5 %
HCT: 34.3 % — ABNORMAL LOW (ref 36.0–46.0)
Hemoglobin: 12.2 g/dL (ref 12.0–15.0)
Immature Granulocytes: 0 %
Lymphocytes Relative: 25 %
Lymphs Abs: 0.8 10*3/uL (ref 0.7–4.0)
MCH: 36.1 pg — ABNORMAL HIGH (ref 26.0–34.0)
MCHC: 35.6 g/dL (ref 30.0–36.0)
MCV: 101.5 fL — ABNORMAL HIGH (ref 80.0–100.0)
Monocytes Absolute: 0.3 10*3/uL (ref 0.1–1.0)
Monocytes Relative: 11 %
Neutro Abs: 1.8 10*3/uL (ref 1.7–7.7)
Neutrophils Relative %: 58 %
Platelets: 85 10*3/uL — ABNORMAL LOW (ref 150–400)
RBC: 3.38 MIL/uL — ABNORMAL LOW (ref 3.87–5.11)
RDW: 14.1 % (ref 11.5–15.5)
WBC: 3.1 10*3/uL — ABNORMAL LOW (ref 4.0–10.5)
nRBC: 0 % (ref 0.0–0.2)

## 2023-11-23 LAB — BASIC METABOLIC PANEL WITH GFR
Anion gap: 4 — ABNORMAL LOW (ref 5–15)
BUN: 11 mg/dL (ref 6–20)
CO2: 24 mmol/L (ref 22–32)
Calcium: 8.3 mg/dL — ABNORMAL LOW (ref 8.9–10.3)
Chloride: 107 mmol/L (ref 98–111)
Creatinine, Ser: 0.58 mg/dL (ref 0.44–1.00)
GFR, Estimated: >60 mL/min (ref >60)
Glucose, Bld: 122 mg/dL — ABNORMAL HIGH (ref 70–99)
Potassium: 4 mmol/L (ref 3.5–5.1)
Sodium: 135 mmol/L (ref 135–145)

## 2023-11-23 LAB — PROTIME-INR
INR: 1.2 (ref 0.8–1.2)
Prothrombin Time: 15 s (ref 11.4–15.2)

## 2023-11-23 MED ORDER — IOHEXOL 300 MG/ML  SOLN
100.0000 mL | Freq: Once | INTRAMUSCULAR | Status: AC | PRN
  Administered 2023-11-23: 100 mL via INTRAVENOUS

## 2023-11-23 NOTE — ED Provider Notes (Signed)
 [name] EMERGENCY DEPARTMENT AT [facility] Provider Note   CSN: [id number] Arrival date & time: 11/23/23  0146     History  Chief Complaint  Patient presents with   Laceration    [name] is a 58 y.o. female.  The history is provided by the patient.  Laceration She has history of hypertension, diabetes, hyperlipidemia, blindness, COPD and comes in because of spontaneous bleeding from her umbilicus.  She had 1 episode this afternoon, and second episode tonight.  She denies any trauma.  She denies any other bleeding problems.  She is not on any anticoagulants.   Home Medications Prior to Admission medications   Medication Sig Start Date End Date Taking? Authorizing Provider  atorvastatin (LIPITOR) 80 MG tablet Take 80 mg by mouth daily.  Patient not taking: Reported on 05/21/2023 09/21/15   [provider]  FLUoxetine  (PROZAC ) 20 MG capsule Take 1 capsule (20 mg total) by mouth daily. 10/22/23   Rankin, Shuvon B, NP  insulin degludec (TRESIBA) 100 UNIT/ML FlexTouch Pen Inject 40 Units into the skin daily.  Patient not taking: Reported on 05/21/2023    [provider]  latanoprost (XALATAN) 0.005 % ophthalmic solution Place 1 drop into both eyes at bedtime.     [provider]  levothyroxine  (SYNTHROID ) 150 MCG tablet Take 100 mcg by mouth daily before breakfast. 01/15/21   [provider]  mirtazapine  (REMERON ) 15 MG tablet Take 1 tablet (15 mg total) by mouth at bedtime. 10/22/23   Rankin, Shuvon B, NP  telmisartan (MICARDIS) 40 MG tablet Take 40 mg by mouth daily. Patient not taking: Reported on 05/21/2023    [provider]      Allergies    Zoloft  [sertraline  hcl] and Metformin    Review of Systems   Review of Systems  All other systems reviewed and are negative.   Physical Exam Updated Vital Signs BP 128/74   Pulse 70   Temp 98 F (36.7 C) (Oral)   Resp 18   Ht 5\' 4"  (1.626 m)   Wt 103 kg   SpO2  94%   BMI 38.98 kg/m  Physical Exam Vitals and nursing note reviewed.   58 year old female, resting comfortably and in no acute distress. Vital signs are normal. Oxygen  saturation is 94%, which is normal. Head is normocephalic and atraumatic. PERRLA, EOMI.  Lungs are clear without rales, wheezes, or rhonchi. Heart has regular rate and rhythm without murmur. Abdomen is soft, flat, nontender.  There is some blood present in the umbilicus, but when removed, no obvious bleeding source is seen.  There is some bluish discoloration to the right side of the umbilicus with some slight bulging but no tenderness. Skin is warm and dry without rash. Neurologic: Awake and alert, moves all extremities equally.  ED Results / Procedures / Treatments   Labs (all labs ordered are listed, but only abnormal results are displayed) Labs Reviewed  CBC WITH DIFFERENTIAL/PLATELET - Abnormal; Notable for the following components:      Result Value   WBC 3.1 (*)    RBC 3.38 (*)    HCT 34.3 (*)    MCV 101.5 (*)    MCH 36.1 (*)    Platelets 85 (*)    All other components within normal limits  BASIC METABOLIC PANEL WITH GFR - Abnormal; Notable for the following components:   Glucose, Bld 122 (*)    Calcium 8.3 (*)    Anion gap 4 (*)  All other components within normal limits  PROTIME-INR   Radiology CT ABDOMEN PELVIS W CONTRAST Result Date: 11/23/2023 CLINICAL DATA:  Bleeding from the region of the umbilicus. EXAM: CT ABDOMEN AND PELVIS WITH CONTRAST TECHNIQUE: Multidetector CT imaging of the abdomen and pelvis was performed using the standard protocol following bolus administration of intravenous contrast. RADIATION DOSE REDUCTION: This exam was performed according to the departmental dose-optimization program which includes automated exposure control, adjustment of the mA and/or kV according to patient size and/or use of iterative reconstruction technique. CONTRAST:  OMNIPAQUE  IOHEXOL  300 MG/ML  SOLN  COMPARISON:  None Available. FINDINGS: Lower chest: Centrilobular emphsyema noted.  No pleural effusion. Hepatobiliary: Nodular liver contour is compatible with cirrhosis. Multiple calcified gallstones are seen, measuring up to 13 mm. No suspicious focal abnormality within the liver parenchyma. No intrahepatic or extrahepatic biliary dilation. Pancreas: Pancreatic parenchyma shows diffuse ill-defined margins without discrete or focal mass lesion. No dilatation of the main pancreatic duct. Spleen: No splenomegaly. No suspicious focal mass lesion. Adrenals/Urinary Tract: Left adrenal gland unremarkable. 7.1 x 4.2 x 5.5 cm rim calcified structures identified in the right suprarenal space. This lesion is relatively low density but somewhat heterogeneous (see image 21/2), potentially with anterior soft tissue component. Kidneys unremarkable. No evidence for hydroureter. The urinary bladder appears normal for the degree of distention. Stomach/Bowel: Stomach is unremarkable. No gastric wall thickening. No evidence of outlet obstruction. Duodenum is normally positioned as is the ligament of Treitz. No small bowel wall thickening. No small bowel dilatation. The terminal ileum is normal. The appendix is not well visualized, but there is no edema or inflammation in the region of the cecal tip to suggest appendicitis. No gross colonic mass. No colonic wall thickening. Diverticular changes are noted in the left colon without evidence of diverticulitis. Vascular/Lymphatic: There is moderate atherosclerotic calcification of the abdominal aorta without aneurysm. Portal vein and superior mesenteric vein are prominent but patent. Prominent splenic vein is patent. Recanalization of the paraumbilical vein is associated with paraesophageal varices, features compatible with portal venous hypertension. There is no gastrohepatic or hepatoduodenal ligament lymphadenopathy. No retroperitoneal or mesenteric lymphadenopathy. No pelvic sidewall  lymphadenopathy. Reproductive: The uterus is unremarkable.  There is no adnexal mass. Other: No intraperitoneal free fluid. Musculoskeletal: No worrisome lytic or sclerotic osseous abnormality. Umbilical hernia identified containing fat and fluid. Hernia sac measures on the order of 4.5 x 2.9 x 7.5 cm. Underlying fascial defect measures approximately 1.4 x 1.6 cm. No evidence for bowel contained within the hernia. IMPRESSION: 1. Umbilical hernia containing fat and fluid. Hernia sac measures on the order of 4.5 x 2.9 x 7.5 cm. Underlying fascial defect measures approximately 1.4 x 1.6 cm. No evidence for bowel contained within the hernia. 2. 7.1 x 4.2 x 5.5 cm rim calcified structure in the right suprarenal space. This lesion is relatively low density but somewhat heterogeneous, potentially with an anterior soft tissue component. This may be a rim calcified adrenal cyst or chronic hematoma. Nonemergent outpatient abdominal MRI with and without contrast recommended to further evaluate and exclude underlying mass lesion. 3. Cirrhosis with portal venous hypertension. 4. Cholelithiasis. 5. Left colonic diverticulosis without diverticulitis. 6.  Aortic Atherosclerosis (ICD10-I70.0). Electronically Signed   By: Donnal Fusi M.D.   On: 11/23/2023 05:51    Procedures Procedures    Medications Ordered in ED Medications  iohexol  (OMNIPAQUE ) 300 MG/ML solution 100 mL (100 mLs Intravenous Contrast Given 11/23/23 0438)    ED Course/ Medical Decision Making/ A&P  Medical Decision Making Amount and/or Complexity of Data Reviewed Labs: ordered. Radiology: ordered.  Risk Prescription drug management.   Spontaneous bleeding from the umbilicus, no obvious bleeding source seen on exam.  Specific cause is unclear.  I am ordering screening labs to look for evidence of possible thrombocytopenia or coagulopathy.  I am ordering CT of abdomen and pelvis to look for evidence of possible  mass that might cause spontaneous bleeding.  I have reviewed her laboratory tests, and my interpretation is mild leukopenia and moderate thrombocytopenia not significantly changed from baseline, normal hemoglobin.  CT scan shows an umbilical hernia which probably is what I am seeing in her umbilicus.  It is fluid filled and is likely the source of the bleeding.  Findings suggestive of cirrhosis are present which account for her leukopenia and thrombocytopenia.  I am referring her to general surgery for an opinion regarding whether she should have her umbilical hernia repaired.  Otherwise, return to the emergency department only if she has recurrent bleeding which is not able to be controlled at home.  Final Clinical Impression(s) / ED Diagnoses Final diagnoses:  Umbilical bleeding  Umbilical hernia without obstruction and without gangrene  Leukopenia, unspecified type  Thrombocytopenia [facility])    Rx / DC Orders ED Discharge Orders     None         Alissa April, MD 11/23/23 [phone]

## 2023-11-23 NOTE — Discharge Instructions (Addendum)
 Your CT scan showed an umbilical hernia.  I suspect that this is what we will was bleeding.  If bleeding recurs and is not easily controlled, return to the emergency department.  Also, return if you start having vomiting or severe abdominal pain.  Please follow-up with the general surgeon to discuss treatment options.

## 2023-11-23 NOTE — ED Triage Notes (Signed)
 Pt c/o belly button bleeding at times and bleeding controlled at this time.

## 2023-11-24 ENCOUNTER — Other Ambulatory Visit (HOSPITAL_COMMUNITY): Payer: Self-pay | Admitting: Registered Nurse

## 2023-11-24 ENCOUNTER — Telehealth (HOSPITAL_COMMUNITY): Payer: Self-pay

## 2023-11-24 ENCOUNTER — Telehealth (INDEPENDENT_AMBULATORY_CARE_PROVIDER_SITE_OTHER): Admitting: General Surgery

## 2023-11-24 DIAGNOSIS — K429 Umbilical hernia without obstruction or gangrene: Secondary | ICD-10-CM | POA: Insufficient documentation

## 2023-11-24 NOTE — Telephone Encounter (Signed)
 Received a call from patient in regards to scheduling appointment with Dr. Collene Dawson per ER AVS. Routed message to provider for further recommendations.

## 2023-11-24 NOTE — Telephone Encounter (Signed)
 Refill from [facility] [address] requesting a refill on pt's Fluoxetine  HCL 20 MG Capsule. Pt is scheduled 12/17/23. Please advise

## 2023-11-24 NOTE — Telephone Encounter (Signed)
 Patient called stating she would like her Fluoxetine  to be sent to [facility] in [place] [address].

## 2023-11-24 NOTE — Telephone Encounter (Signed)
 [facility]  Patient seen in ED 11/23/23. I did not receive any calls about the patient over night. The ED provider, Dr. Candelaria Chaco, saw her and evaluated her with imaging and lab work. They recommended she see surgery as an outpatient.   Per report patient is bleeding from the hernia. I do not see any photos in the chart. The ED notes reports no obvious signs of bleeding after blood was cleaned off. Dr. Candelaria Chaco Note documents "There is some blood present in the umbilicus, but when removed, no obvious bleeding source is seen. There is some bluish discoloration to the right side of the umbilicus with some slight bulging but no tenderness." CT scan with hernia and fluid in the hernia but no bowel in the hernia. CT also notes signs of cirrhosis.    I see no documentation from GI or any evaluation from GI about any cirrhosis, no recent LFTs etc.  Patient will need to get referred to GI for cirrhosis.   We can evaluated the patient's hernia but will not be able to fix anything until GI has evaluated her for the cirrhosis given the risk of repair with cirrhosis.   If patient has further drainage, bleeding from the hernia, she would need to go back to ED as she could be eroding the skin and needing more urgent treatment.   Patient should also go back to the ED with any worsening pain.  Deena Farrier, MD [facility] [address] [place], [place] [address] [phone] (office)

## 2023-11-30 ENCOUNTER — Ambulatory Visit (INDEPENDENT_AMBULATORY_CARE_PROVIDER_SITE_OTHER): Admitting: Psychiatry

## 2023-11-30 DIAGNOSIS — F331 Major depressive disorder, recurrent, moderate: Secondary | ICD-10-CM | POA: Diagnosis not present

## 2023-11-30 DIAGNOSIS — F411 Generalized anxiety disorder: Secondary | ICD-10-CM
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
## 2023-12-02 ENCOUNTER — Ambulatory Visit (INDEPENDENT_AMBULATORY_CARE_PROVIDER_SITE_OTHER): Admitting: Gastroenterology

## 2023-12-02 ENCOUNTER — Telehealth: Payer: Self-pay | Admitting: *Deleted

## 2023-12-02 VITALS — BP 109/72 | HR 73 | Temp 98.0°F | Ht 64.5 in | Wt 237.7 lb

## 2023-12-02 DIAGNOSIS — K7460 Unspecified cirrhosis of liver: Secondary | ICD-10-CM | POA: Insufficient documentation

## 2023-12-02 DIAGNOSIS — Z6841 Body Mass Index (BMI) 40.0 and over, adult: Secondary | ICD-10-CM

## 2023-12-02 DIAGNOSIS — K429 Umbilical hernia without obstruction or gangrene: Secondary | ICD-10-CM | POA: Diagnosis not present

## 2023-12-02 DIAGNOSIS — D696 Thrombocytopenia, unspecified: Secondary | ICD-10-CM

## 2023-12-02 DIAGNOSIS — K766 Portal hypertension: Secondary | ICD-10-CM | POA: Diagnosis not present

## 2023-12-02 NOTE — Telephone Encounter (Signed)
 [employer] PA: Case Number: [id number] Review Date: 12/02/2023 11:06:12 AM Expiration Date: N/A Status: This member's benefit plan did not require a prior authorization for this request.

## 2023-12-02 NOTE — Patient Instructions (Signed)
 It was very nice to meet you today, as dicussed with will plan for the following :  1) Echocardiogram

## 2023-12-02 NOTE — Progress Notes (Signed)
 Asier Desroches Faizan Cornelis Kluver , M.D. Gastroenterology & Hepatology [facility] Gastroenterology [address] [place], [place] [address] Primary Care Physician: Lauran Pollard, MD [address] [place] [address]  Chief Complaint:  cirrhosis and presurgical evaluation for umbilical hernia repair  History of Present Illness:  [name] is a 57 y.o. 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  Denies any current redness pain, nausea or vomiting given no abdominal pain.No history of decompensating events (no jaundice, variceal bleeding, ascites, encephalopathy).   Patient appears to be following with [facility] healthcare transplant pathology last seen over 1 year ago as well as with hematology and would like to transfer care locally because of transportation ease.  She had a normal bone marrow biopsy in August 2022. 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   The patient denies having any nausea, vomiting, fever, chills, hematochezia, melena, hematemesis, abdominal distention, abdominal pain, diarrhea, jaundice, pruritus or weight loss.  Last EGD:09/2022 at [facility]  A medium-sized hiatal hernia was present. No varices.       No gross lesions were noted in the entire examined stomach. No varices       The examined duodenum was normal.    - Repeat upper endoscopy in 2 years for surveillance.    Last Colonoscopy: None   FHx: neg for any gastrointestinal/liver disease, no malignancies Social: neg smoking, alcohol or illicit drug use  Labs from 10/2023 creatinine 0.58 AST 39 ALT 26 Normal T. bili  hemoglobin 12.2 platelet 85 INR 1.2 Ultrasound 07/2022  1. Cholelithiasis without secondary signs of acute cholecystitis.  2. Morphologic changes to the liver compatible with cirrhosis. No  focal hepatic lesion.  3. Common bile duct is dilated measuring 8.4 mm. Recommend  correlation with LFTs. If abnormal, recommend MRI/MRCP.  4. Calcified right adrenal mass, previously evaluated, compatible  with benign process given stability over time.   Past Medical History: Past Medical History:  Diagnosis Date   Anxiety    Arthritis    Asthma    Blind    Chronic respiratory failure (HCC)    COPD (chronic obstructive pulmonary disease) (HCC)    Depression    Diabetes mellitus without complication (HCC)    Glaucoma    Hyperlipidemia    Hypertension    Hypothyroidism    PONV (postoperative nausea and vomiting)    Sleep apnea    12   Thyroid disease     Past Surgical History: Past Surgical History:  Procedure Laterality Date   ANKLE SURGERY Right 2007   CATARACT EXTRACTION W/PHACO Left 02/27/2023   Procedure: CATARACT EXTRACTION PHACO AND INTRAOCULAR LENS PLACEMENT (IOC);  Surgeon: Tarri Farm, MD;  Location: [facility];  Service: Ophthalmology;  Laterality: Left;  CDE 9.85   ENDOMETRIAL ABLATION     ORIF ANKLE FRACTURE Left 12/01/2012   Procedure: OPEN REDUCTION INTERNAL FIXATION (ORIF) ANKLE FRACTURE;  Surgeon: Pleasant Brilliant, MD;  Location: [facility];  Service: Orthopedics;  Laterality: Left;   TONSILLECTOMY  TUBAL LIGATION     TUBAL LIGATION      Family History: Family History  Problem Relation Age of Onset   Cancer Mother    Heart failure Father    Depression Daughter    Other Son        MVA    Social History: Social History   Tobacco Use  Smoking Status  Every Day   Current packs/day: 0.50   Average packs/day: 0.5 packs/day for 39.0 years (19.5 ttl pk-yrs)   Types: Cigarettes  Smokeless Tobacco Never   Social History   Substance and Sexual Activity  Alcohol Use No   Social History   Substance and Sexual Activity  Drug Use No    Allergies: Allergies  Allergen Reactions   Zoloft  [Sertraline  Hcl] Diarrhea and Nausea And Vomiting   Metformin Diarrhea    Medications: Current Outpatient Medications  Medication Sig Dispense Refill   FLUoxetine  (PROZAC ) 20 MG capsule TAKE 1 CAPSULE BY MOUTH EVERY DAY 30 capsule 0   levothyroxine  (SYNTHROID ) 150 MCG tablet Take 100 mcg by mouth daily before breakfast.     mirtazapine  (REMERON ) 15 MG tablet Take 1 tablet (15 mg total) by mouth at bedtime. 30 tablet 1   latanoprost (XALATAN) 0.005 % ophthalmic solution Place 1 drop into both eyes at bedtime.      No current facility-administered medications for this visit.    Review of Systems: GENERAL: negative for malaise, night sweats HEENT: No changes in hearing or vision, no nose bleeds or other nasal problems. NECK: Negative for lumps, goiter, pain and significant neck swelling RESPIRATORY: Negative for cough, wheezing CARDIOVASCULAR: Negative for chest pain, leg swelling, palpitations, orthopnea GI: SEE HPI MUSCULOSKELETAL: Negative for joint pain or swelling, back pain, and muscle pain. SKIN: Negative for lesions, rash HEMATOLOGY Negative for prolonged bleeding, bruising easily, and swollen nodes. ENDOCRINE: Negative for cold or heat intolerance, polyuria, polydipsia and goiter. NEURO: negative for tremor, gait imbalance, syncope and seizures. The remainder of the review of systems is noncontributory.   Physical Exam: BP 109/72   Pulse 73   Temp 98 F (36.7 C)   Ht 5' 4.5" (1.638 m)   Wt 237 lb 11.2 oz (107.8 kg)   BMI 40.17 kg/m  GENERAL: The patient is AO x3, in no acute distress. HEENT: Head is normocephalic and atraumatic.  EOMI are intact. Mouth is well hydrated and without lesions. NECK: Supple. No masses LUNGS: Clear to auscultation. No presence of rhonchi/wheezing/rales. Adequate chest expansion HEART: RRR, normal s1 and s2. ABDOMEN: Soft, nontender, no guarding, no peritoneal signs, and nondistended. BS +. No masses.  Reducible umbilical hernia   Imaging/Labs: as above     Latest Ref Rng & Units 11/23/2023    4:07 AM 09/21/2023    2:10 PM 04/22/2018    2:40 PM  CBC  WBC 4.0 - 10.5 K/uL 3.1  3.4  6.8   Hemoglobin 12.0 - 15.0 g/dL 32.4  40.1  02.7   Hematocrit 36.0 - 46.0 % 34.3  40.1  38.3   Platelets 150 - 400 K/uL 85  97  175    No results found for: "IRON", "TIBC", "FERRITIN"  CT ABDOMEN AND PELVIS 10/2023  IMPRESSION: 1. Umbilical hernia containing fat and fluid. Hernia sac measures on the order of 4.5 x 2.9 x 7.5 cm. Underlying fascial defect measures approximately 1.4 x 1.6 cm. No evidence for bowel contained within the hernia. 2. 7.1 x 4.2 x 5.5 cm rim calcified structure in the right  suprarenal space. This lesion is relatively low density but somewhat heterogeneous, potentially with an anterior soft tissue component. This may be a rim calcified adrenal cyst or chronic hematoma. Nonemergent outpatient abdominal MRI with and without contrast recommended to further evaluate and exclude underlying mass lesion. 3. Cirrhosis with portal venous hypertension. 4. Cholelithiasis. 5. Left colonic diverticulosis without diverticulitis. 6.  Aortic Atherosclerosis (ICD10-I70.0).  I personally reviewed and interpreted the available labs, imaging and endoscopic files.  Impression and Plan:  KAMREN HESKETT is a 58 y.o. 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  Her major issue is rather morbid obesity.   Predicted Postoperative Outcomes by the VOCAL-Penn Score:     30-day mortality: 0.8%     90-day mortality: 2.1%     180-day mortality: 3.2%     90-day decompensation: 12.1%  #Eitology :   Previously followed up with [facility] transplant hepatology extensive workup and now want to transfer care here at [facility]  Likely metabolic dysfunction-associated steatotic liver disease (history of morbid obesity, diabetes mellitus, sleep apnea, and possible hypertension)    ANA 1:160, ASMA negative, IgG 1916, AAT 97, PI*MZ   PI*MZ with mildly low AAT level may have contributed to development of chronic liver disease but is not likely the primary etiology.   There was query for autoimmune hepatitis given elevated IgG given normal liver enzymes no biopsy was pursued and patient did not meet criteria for treatment.Will continue to evaluate with repeat labs   # Hepatic encephalopathy None - Avoid opiates or benzodiazepines  # Ascites - Low sodium diet None on exam and recent imaging  # Esophageal varices - Last EGD 2024 without cirrhosis,   Given evidence of portal hypertension and thrombocytopenia patient likely has clinically significant portal hypertension and would benefit from nonselective beta-blocker.  Will obtain echocardiogram to ensure there is no heart failure and will start patient on carvedilol 3.125 twice daily to titrate to heart rate to 55-60 and SBP more than 90 mmHg  # HCC screening - Last abdominal imaging 10/2023 without any focal lesion -Repeat right upper quadrant sono plus AFP every 6  Recommendation   - Check CBC, MELD labs and AFP in 6  months , along with IgG and ANA, and will repeat hepatitis A,B and C serologies in future since patient got vaccinated against A and B - Schedule liver US  in 6 months - Reduce salt intake to <2 g per day - Can take Tylenol  max of 2 g per day (650 mg q8h) for pain - Avoid NSAIDs for pain - Avoid eating raw oysters/shellfish - Protein shake (Ensure or Boost) every night before going to sleep  #HCM:  Patient has been getting stool based testing for colon cancer screening and reports that she got one recently and will be doing soon  All questions were answered.      [name] Faizan Venice Liz, MD Gastroenterology and Hepatology [facility] Gastroenterology   This chart has been completed using [employer] Dictation software, and while attempts have been made to ensure accuracy ,  certain words and phrases may not be transcribed as intended

## 2023-12-07 ENCOUNTER — Telehealth (INDEPENDENT_AMBULATORY_CARE_PROVIDER_SITE_OTHER): Payer: Self-pay

## 2023-12-07 NOTE — Telephone Encounter (Signed)
 I spoke with the patient and made her aware that Dr. Alita Irwin is out this week and per Karna Pacas his NP,  ECHO hasn't been done yet. Looks like it is scheduled for 7/1. Please tell patient Dr. Alita Irwin will need to review that when it is completed prior to starting meds.   Patient states understanding.

## 2023-12-07 NOTE — Telephone Encounter (Signed)
 Thanks. ECHO hasn't been done yet. Looks like it is scheduled for 7/1. Please tell patient Dr. Alita Irwin will need to review that when it is completed prior to starting meds.

## 2023-12-07 NOTE — Telephone Encounter (Signed)
 Patient calling today stating her medication that Dr. Alita Irwin said he was going to start her on has not been submitted to the pharmacy. Per last ov by Dr. Alita Irwin from 12/02/2023 Will obtain echocardiogram to ensure there is no heart failure and will start patient on carvedilol 3.125 twice daily to titrate to heart rate to 55-60 and SBP more than 90 mmHg. Please advise. Patient uses [facility]. Thanks

## 2023-12-14 ENCOUNTER — Ambulatory Visit (HOSPITAL_COMMUNITY): Admitting: Psychiatry

## 2023-12-15 ENCOUNTER — Ambulatory Visit (INDEPENDENT_AMBULATORY_CARE_PROVIDER_SITE_OTHER): Admitting: General Surgery

## 2023-12-15 ENCOUNTER — Ambulatory Visit (HOSPITAL_COMMUNITY): Admitting: Psychiatry

## 2023-12-15 ENCOUNTER — Encounter: Payer: Self-pay | Admitting: General Surgery

## 2023-12-15 VITALS — BP 131/78 | HR 59 | Temp 98.0°F | Resp 16 | Ht 64.5 in | Wt 237.0 lb

## 2023-12-15 DIAGNOSIS — K429 Umbilical hernia without obstruction or gangrene: Secondary | ICD-10-CM

## 2023-12-15 DIAGNOSIS — F331 Major depressive disorder, recurrent, moderate: Secondary | ICD-10-CM

## 2023-12-15 DIAGNOSIS — F4310 Post-traumatic stress disorder, unspecified: Secondary | ICD-10-CM

## 2023-12-15 DIAGNOSIS — F411 Generalized anxiety disorder: Secondary | ICD-10-CM

## 2023-12-15 NOTE — Progress Notes (Signed)
 [facility] History and Physical  Reason for Referral: Umbilical hernia with reported bleeding?  Referring Physician: ED   Chief Complaint   Hospitalization Follow-up     [name] is a 58 y.o. female.  HPI:    [name] is a 58 yo who presented to the ED with an umbilical hernia with fat in the hernia on CT but reported bleeding from the skin. The Ed investigated and noted no bleeding or lesions. She says this still bleeds intermittently. She denies other drainage.   She also has a history of cirrhosis and was seen by [facility] hepatology in the past but has not been followed. Her CT reported the cirrhosis and some portal hypertension.   She was referred to us  for evaluation of the hernia and I reviewed the case. I felt that she needed to be seen by GI prior to any repair given the risk of cirrhosis and decompensation. She has seen Dr. Alita Irwin and he thinks she is stable and has low risk of decompensation. He is now following her. He thinks she is amenable to surgery at [facility].   Of note on reviewing the CT today, I noted mention of a calcified mass suprarenal and found this on CT chest from 2014. I notified the radiologist who read her CT a/p from the ED and he agreed that she does not need an MRI and addendum to the report of the CT was completed.   Past Medical History:  Diagnosis Date   Anxiety    Arthritis    Asthma    Blind    Chronic respiratory failure (HCC)    COPD (chronic obstructive pulmonary disease) (HCC)    Depression    Diabetes mellitus without complication (HCC)    Glaucoma    Hyperlipidemia    Hypertension    Hypothyroidism    PONV (postoperative nausea and vomiting)    Sleep apnea    12   Thyroid disease     Past Surgical History:  Procedure Laterality Date   ANKLE SURGERY Right 2007   CATARACT EXTRACTION W/PHACO Left 02/27/2023   Procedure: CATARACT EXTRACTION PHACO AND INTRAOCULAR LENS PLACEMENT (IOC);  Surgeon: Tarri Farm, MD;  Location: [facility];  Service: Ophthalmology;  Laterality: Left;  CDE 9.85   ENDOMETRIAL ABLATION     ORIF ANKLE FRACTURE Left 12/01/2012   Procedure: OPEN REDUCTION INTERNAL FIXATION (ORIF) ANKLE FRACTURE;  Surgeon: Pleasant Brilliant, MD;  Location: [facility];  Service: Orthopedics;  Laterality: Left;   TONSILLECTOMY     TUBAL LIGATION     TUBAL LIGATION      Family History  Problem Relation Age of Onset   Cancer Mother    Heart failure Father    Depression Daughter    Other Son        MVA    Social History   Tobacco Use   Smoking status: Every Day    Current packs/day: 0.50    Average packs/day: 0.5 packs/day for 39.0 years (19.5 ttl pk-yrs)    Types: Cigarettes   Smokeless tobacco: Never  Vaping Use   Vaping status: Never Used  Substance Use Topics   Alcohol use: No   Drug use: No    Medications: I have reviewed the patient's current medications. Allergies as of 12/15/2023       Reactions   Zoloft  [sertraline  Hcl] Diarrhea, Nausea And Vomiting   Metformin Diarrhea        Medication List  Accurate as of December 15, 2023 10:43 AM. If you have any questions, ask your nurse or doctor.          FLUoxetine  20 MG capsule Commonly known as: PROZAC  TAKE 1 CAPSULE BY MOUTH EVERY DAY   latanoprost 0.005 % ophthalmic solution Commonly known as: XALATAN Place 1 drop into both eyes at bedtime.   levothyroxine  150 MCG tablet Commonly known as: SYNTHROID  Take 100 mcg by mouth daily before breakfast.   mirtazapine  15 MG tablet Commonly known as: REMERON  Take 1 tablet (15 mg total) by mouth at bedtime.         ROS:  A comprehensive review of systems was negative except for: Gastrointestinal: positive for abdominal pain at hernia, bleeding from skin Musculoskeletal: positive for joint pain  Blood pressure 131/78, pulse (!) 59, temperature 98 F (36.7 C), temperature source Oral, resp. rate 16, height 5' 4.5 (1.638 m), weight 237 lb (107.5 kg), SpO2  92%. Physical Exam Vitals reviewed.  Constitutional:      Appearance: She is obese.  HENT:     Head: Normocephalic.     Nose: Nose normal.     Mouth/Throat:     Mouth: Mucous membranes are moist.   Eyes:     Pupils: Pupils are equal, round, and reactive to light.    Cardiovascular:     Rate and Rhythm: Normal rate and regular rhythm.  Pulmonary:     Effort: Pulmonary effort is normal.     Breath sounds: Normal breath sounds.  Abdominal:     General: There is no distension.     Palpations: Abdomen is soft.     Tenderness: There is abdominal tenderness.     Hernia: A hernia is present.     Comments: Non-reducible hernia, investigated the umbilicus which is inferior to the mass and saw no signs of opening or bleeding at this time, no skin defect noted    Musculoskeletal:        General: Normal range of motion.     Cervical back: Normal range of motion.   Skin:    General: Skin is warm.   Neurological:     General: No focal deficit present.     Mental Status: She is alert.   Psychiatric:        Mood and Affect: Mood normal.        Judgment: Judgment normal.     Results:  ADDENDUM: It has been brought to my attention that this patient has a previous chest CT from 10/20/2013 which included the upper abdomen. Although incompletely visualized on that exam, the rim calcified right adrenal mass is visible and completely stable in size through the imaged portion of the lesion comparing back to that study from 10 years ago. As such, this lesion is considered benign and probably reflects sequelae of old hemorrhage/hematoma or infection of the adrenal gland. No further imaging follow-up indicated.     Electronically Signed   By: Donnal Fusi M.D.   On: 12/16/2023 04:57 CLINICAL DATA:  Bleeding from the region of the umbilicus.   EXAM: CT ABDOMEN AND PELVIS WITH CONTRAST   TECHNIQUE: Multidetector CT imaging of the abdomen and pelvis was performed using the standard  protocol following bolus administration of intravenous contrast.   RADIATION DOSE REDUCTION: This exam was performed according to the departmental dose-optimization program which includes automated exposure control, adjustment of the mA and/or kV according to patient size and/or use of iterative reconstruction technique.   CONTRAST:  OMNIPAQUE  IOHEXOL  300 MG/ML  SOLN   COMPARISON:  None Available.   FINDINGS: Lower chest: Centrilobular emphsyema noted.  No pleural effusion.   Hepatobiliary: Nodular liver contour is compatible with cirrhosis. Multiple calcified gallstones are seen, measuring up to 13 mm. No suspicious focal abnormality within the liver parenchyma. No intrahepatic or extrahepatic biliary dilation.   Pancreas: Pancreatic parenchyma shows diffuse ill-defined margins without discrete or focal mass lesion. No dilatation of the main pancreatic duct.   Spleen: No splenomegaly. No suspicious focal mass lesion.   Adrenals/Urinary Tract: Left adrenal gland unremarkable. 7.1 x 4.2 x 5.5 cm rim calcified structures identified in the right suprarenal space. This lesion is relatively low density but somewhat heterogeneous (see image 21/2), potentially with anterior soft tissue component. Kidneys unremarkable. No evidence for hydroureter. The urinary bladder appears normal for the degree of distention.   Stomach/Bowel: Stomach is unremarkable. No gastric wall thickening. No evidence of outlet obstruction. Duodenum is normally positioned as is the ligament of Treitz. No small bowel wall thickening. No small bowel dilatation. The terminal ileum is normal. The appendix is not well visualized, but there is no edema or inflammation in the region of the cecal tip to suggest appendicitis. No gross colonic mass. No colonic wall thickening. Diverticular changes are noted in the left colon without evidence of diverticulitis.   Vascular/Lymphatic: There is moderate atherosclerotic calcification of  the abdominal aorta without aneurysm. Portal vein and superior mesenteric vein are prominent but patent. Prominent splenic vein is patent. Recanalization of the paraumbilical vein is associated with paraesophageal varices, features compatible with portal venous hypertension. There is no gastrohepatic or hepatoduodenal ligament lymphadenopathy. No retroperitoneal or mesenteric lymphadenopathy. No pelvic sidewall lymphadenopathy.   Reproductive: The uterus is unremarkable.  There is no adnexal mass.   Other: No intraperitoneal free fluid.   Musculoskeletal: No worrisome lytic or sclerotic osseous abnormality. Umbilical hernia identified containing fat and fluid. Hernia sac measures on the order of 4.5 x 2.9 x 7.5 cm. Underlying fascial defect measures approximately 1.4 x 1.6 cm. No evidence for bowel contained within the hernia.   IMPRESSION: 1. Umbilical hernia containing fat and fluid. Hernia sac measures on the order of 4.5 x 2.9 x 7.5 cm. Underlying fascial defect measures approximately 1.4 x 1.6 cm. No evidence for bowel contained within the hernia. 2. 7.1 x 4.2 x 5.5 cm rim calcified structure in the right suprarenal space. This lesion is relatively low density but somewhat heterogeneous, potentially with an anterior soft tissue component. This may be a rim calcified adrenal cyst or chronic hematoma. Nonemergent outpatient abdominal MRI with and without contrast recommended to further evaluate and exclude underlying mass lesion. 3. Cirrhosis with portal venous hypertension. 4. Cholelithiasis. 5. Left colonic diverticulosis without diverticulitis. 6.  Aortic Atherosclerosis (ICD10-I70.0).   Electronically Signed: By: Donnal Fusi M.D. On: 11/23/2023 05:51  Assessment and Plan:  [name] is a 58 y.o. female with an umbilical hernia with incarcerated fat/ omentum. She is having strange intermittent bleeding from the skin but I see no obvious lesions. I wonder if this is  exacerbated by her platelet count in the 90s. No obvious vessels seen on CT.   Discussed with Dr. Alita Irwin the plan for open umbilical repair with mesh and he thinks she will be fine to have surgery at [facility]. Discussed with patient risk of bleeding, infection, recurrence, mesh use, cirrhosis decompensation and risk. Dr. Alita Irwin has put her in to his calculators and thinks she is  0.8% risk of 30 day post operative mortality VOCAL-Penn Score.   All questions were answered to the satisfaction of the patient.  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   [name] 12/15/2023, 10:43 AM
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
## 2023-12-15 NOTE — Patient Instructions (Signed)
 Will call you once we have verified that Dr. Alita Irwin is ok with you can get an open umbilical hernia repair at [facility].

## 2023-12-17 ENCOUNTER — Encounter (HOSPITAL_COMMUNITY): Payer: Self-pay | Admitting: Registered Nurse

## 2023-12-17 ENCOUNTER — Telehealth (INDEPENDENT_AMBULATORY_CARE_PROVIDER_SITE_OTHER): Admitting: Registered Nurse

## 2023-12-17 DIAGNOSIS — F331 Major depressive disorder, recurrent, moderate: Secondary | ICD-10-CM

## 2023-12-17 DIAGNOSIS — G4700 Insomnia, unspecified: Secondary | ICD-10-CM | POA: Diagnosis not present

## 2023-12-17 MED ORDER — MIRTAZAPINE 15 MG PO TABS: 15.0000 mg | ORAL_TABLET | Freq: Every day | ORAL | 2 refills | Status: DC

## 2023-12-17 MED ORDER — FLUOXETINE HCL 20 MG PO CAPS
20.0000 mg | ORAL_CAPSULE | Freq: Every day | ORAL | 2 refills | Status: DC
Start: 2023-12-17 — End: 2024-01-18

## 2023-12-17 NOTE — Progress Notes (Signed)
 [facility] MD/PA/NP OP Progress Note  12/17/2023 10:56 AM [name]  MRN:  [id number]   Virtual Visit via Video Note  I connected with [name] on 12/17/23 at  9:30 AM EDT by a video enabled telemedicine application and verified that I am speaking with the correct person using two identifiers.  Location: Patient: Home Provider: Home office   I discussed the limitations of evaluation and management by telemedicine and the availability of in person appointments. The patient expressed understanding and agreed to proceed.  I discussed the assessment and treatment plan with the patient. The patient was provided an opportunity to ask questions and all were answered. The patient agreed with the plan and demonstrated an understanding of the instructions.   The patient was advised to call back or seek an in-person evaluation if the symptoms worsen or if the condition fails to improve as anticipated.  I provided 30 minutes of non-face-to-face time during this encounter.   Julie Magnus, NP   Chief Complaint:  Chief Complaint  Patient presents with   Follow-up    Occasion management   HPI: [name] 58 y.o. female presents to office today for medication management follow up.  She is seen face to face by this provider, and chart reviewed on 12/17/23.  Her psychiatric history is significant for major depression, general anxiety, PTSD, and insomnia.  Her mental health is currently managed with Zoloft  25 mg daily, and Remeron  15 mg Q hs.  She was doing fine and current medications were effectively managing her mental health up until recently.  Reports for the last 4 weeks she has been having issues with an umbilical hernia that needs repair.  About 4 weeks ago I stood up and but started coming out everywhere.  I went to the bathroom to clean up in the ambulance started coming out on the belly button.  My roommate had to call the ambulance to take me to the emergency room.  I have  been following up with the GI doctor and either got I have a hernia repair.  I am scheduled for surgery on July 1.  But since all this has been going on my anxiety and depression has gotten worse.  I cannot go anywhere, I cannot do anything, I cannot lift anything; and my brother had a hernia repair and he still having pain.  She reports she is eating without difficulty except for when she is in pain then she feels nauseous.  Reports sleeping without difficulty unless there is pain.  She does not feel that she needs any adjustments of her medications at this time related to this being situational but states if she does need something she will contact office prior to next visit if adjustments are needed.  She denies suicidal/self-harm/homicidal ideation, psychosis, paranoia, and abnormal movements.   continues to report that she is having nausea, vomiting, and diarrhea from the Zoloft  which has cause worsening in anxiety I feel like I got something in my stomach and I feel like I just want to lash out.  I started taking it at night like you said and I'm up all night until it is out of my system.  Like last night I was up to the bathroom from 3 am to 7 am.  Once it is out of my system I'm fine.  Like I'm fine now.  She states everything has been fine but that.  Reports she was sleeping fine until she started the Zoloft  at  night.   Today she denies suicidal/self-harm/homicidal ideation, psychosis, paranoia, and abnormal movements.        Recommended the following:  Continue Prozac  20 mg daily and Remeron  15 mg at bedtime.  She voiced her understanding and agreement with recommendations.        Visit Diagnosis:    ICD-10-CM   1. MDD (major depressive disorder), recurrent episode, moderate (HCC)  F33.1 FLUoxetine  (PROZAC ) 20 MG capsule    mirtazapine  (REMERON ) 15 MG tablet    2. Insomnia, unspecified type  G47.00 mirtazapine  (REMERON ) 15 MG tablet      Past Psychiatric History: major depression,  general anxiety, PTSD, and insomnia.   Past Medical History:  Past Medical History:  Diagnosis Date   Anxiety    Arthritis    Asthma    Blind    Chronic respiratory failure (HCC)    COPD (chronic obstructive pulmonary disease) (HCC)    Depression    Diabetes mellitus without complication (HCC)    Glaucoma    Hyperlipidemia    Hypertension    Hypothyroidism    PONV (postoperative nausea and vomiting)    Sleep apnea    12   Thyroid disease     Past Surgical History:  Procedure Laterality Date   ANKLE SURGERY Right 2007   CATARACT EXTRACTION W/PHACO Left 02/27/2023   Procedure: CATARACT EXTRACTION PHACO AND INTRAOCULAR LENS PLACEMENT (IOC);  Surgeon: Tarri Farm, MD;  Location: [facility];  Service: Ophthalmology;  Laterality: Left;  CDE 9.85   ENDOMETRIAL ABLATION     ORIF ANKLE FRACTURE Left 12/01/2012   Procedure: OPEN REDUCTION INTERNAL FIXATION (ORIF) ANKLE FRACTURE;  Surgeon: Pleasant Brilliant, MD;  Location: [facility];  Service: Orthopedics;  Laterality: Left;   TONSILLECTOMY     TUBAL LIGATION     TUBAL LIGATION      Family Psychiatric History: See below in family history  Family History:  Family History  Problem Relation Age of Onset   Cancer Mother    Heart failure Father    Depression Daughter    Other Son        MVA    Social History:  Social History   Socioeconomic History   Marital status: Widowed    Spouse name: Not on file   Number of children: 2   Years of education: 65   Highest education level: Not on file  Occupational History   Occupation: N/A  Tobacco Use   Smoking status: Every Day    Current packs/day: 0.50    Average packs/day: 0.5 packs/day for 39.0 years (19.5 ttl pk-yrs)    Types: Cigarettes   Smokeless tobacco: Never  Vaping Use   Vaping status: Never Used  Substance and Sexual Activity   Alcohol use: No   Drug use: No   Sexual activity: Not Currently    Birth control/protection: Surgical    Comment: tubal  Other Topics Concern    Not on file  Social History Narrative   Lives at home w/ roommates   Right-handed   Caffeine: occasional coffee   Social Drivers of Corporate investment banker Strain: Low Risk  (01/17/2021)   [facility]   Overall Financial Resource Strain (CARDIA)    Difficulty of Paying Living Expenses: Not hard at all  Food Insecurity: No Food Insecurity (01/17/2021)   Received [facility]   Hunger Vital Sign    Within the past 12 months, you worried that your food would run out  before you got the money to buy more.: Never true    Within the past 12 months, the food you bought just didn't last and you didn't have money to get more.: Never true  Transportation Needs: No Transportation Needs (01/17/2021)   Received from [facility] - Transportation    Lack of Transportation (Medical): No    Lack of Transportation (Non-Medical): No  Physical Activity: Sufficiently Active (01/17/2021)   Received from [facility]   Exercise Vital Sign    On average, how many days per week do you engage in moderate to strenuous exercise (like a brisk walk)?: 7 days    On average, how many minutes do you engage in exercise at this level?: 30 min  Stress: No Stress Concern Present (01/17/2021)   Received from [facility] of Occupational Health - Occupational Stress Questionnaire    Feeling of Stress : Not at all  Social Connections: Moderately Isolated (01/17/2021)   Received from [facility]   Social Connection and Isolation Panel    In a typical week, how many times do you talk on the phone with family, friends, or neighbors?: More than three times a week    How often do you get together with friends or relatives?: Once a week    How often do you attend church or religious services?: More than 4 times per year    Do you belong to any clubs or organizations such as church groups, unions, fraternal or athletic groups, or school groups?: No    How  often do you attend meetings of the clubs or organizations you belong to?: Never    Are you married, widowed, divorced, separated, never married, or living with a partner?: Widowed    Allergies:  Allergies  Allergen Reactions   Zoloft  [Sertraline  Hcl] Diarrhea and Nausea And Vomiting   Metformin Diarrhea    Metabolic Disorder Labs:  Results reviewed with patient.  Referral to PCP for adjustment of thyroid medications.  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  PCP aware and monitoring. Lab Results  Component Value Date   HGBA1C 5.4 09/21/2023   MPG 108 09/21/2023   MPG 143 04/22/2018   No results found for: PROLACTIN Lab Results  Component Value Date   CHOL 221 (H) 09/21/2023   TRIG 60 09/21/2023   HDL 62 09/21/2023   CHOLHDL 3.6 09/21/2023   LDLCALC 144 (H) 09/21/2023   Lab Results  Component Value Date   TSH 0.13 (L) 09/21/2023   TSH 3.472 12/01/2012    Current Medications: Current Outpatient Medications  Medication Sig Dispense Refill   FLUoxetine  (PROZAC ) 20 MG capsule Take 1 capsule (20 mg total) by mouth daily. 30 capsule 2   latanoprost (XALATAN) 0.005 % ophthalmic solution Place 1 drop into both eyes at bedtime.      levothyroxine  (SYNTHROID ) 150 MCG tablet Take 100 mcg by mouth daily before breakfast.     mirtazapine  (REMERON ) 15 MG tablet Take 1 tablet (15 mg total) by mouth at bedtime. 30 tablet 2   No current facility-administered medications for this visit.     Musculoskeletal: Strength & Muscle Tone: Unable to assess via virtual visit Gait & Station: Unable to assess via virtual visit Patient leans: N/A  Psychiatric Specialty Exam: Review of Systems  Constitutional:        No other complaints voiced at this time  Gastrointestinal:  Positive for nausea. Negative for diarrhea  and vomiting.       Recently diagnosed with umbilical hernia that needs repair.  At this time causing bleeding and pain.  Scheduled for hernia repair surgery  on 12/29/2023  Psychiatric/Behavioral:  Positive for dysphoric mood. Negative for hallucinations, self-injury and suicidal ideas. Sleep disturbance: Related to N/V, diarrhea.The patient is nervous/anxious.   All other systems reviewed and are negative.   There were no vitals taken for this visit.There is no height or weight on file to calculate BMI.  General Appearance: Casual  Eye Contact:  Good  Speech:  Clear and Coherent and Normal Rate  Volume:  Normal  Mood:  Anxious and Dysphoric  Affect:  Congruent  Thought Process:  Coherent, Goal Directed, and Descriptions of Associations: Intact  Orientation:  Full (Time, Place, and Person)  Thought Content: Logical   Suicidal Thoughts:  No  Homicidal Thoughts:  No  Memory:  Immediate;   Good Recent;   Good Remote;   Good  Judgement:  Intact  Insight:  Present  Psychomotor Activity:  Normal  Concentration:  Concentration: Good and Attention Span: Good  Recall:  Good  Fund of Knowledge: Good  Language: Good  Akathisia:  No  Handed:  Right  AIMS (if indicated): done  Assets:  Communication Skills Desire for Improvement Financial Resources/Insurance Housing Leisure Time Resilience Social Support Transportation  ADL's:  Intact  Cognition: WNL  Sleep:  Good   Screenings: AIMS    Flowsheet Row Office Visit from 09/21/2023 in [facility] at [place]  AIMS Total Score 0   GAD-7    Flowsheet Row Video Visit from 12/17/2023 in [facility] at [place] Office Visit from 09/21/2023 in [facility] [place] Counselor from 07/30/2023 in [facility] at [place] Counselor from 05/21/2023 in [facility] at [place]  Total GAD-7 Score 21 21 21 21    PHQ2-9    Flowsheet Row Video Visit from 12/17/2023 in [facility] at [place] Office Visit from 09/21/2023 in  [facility] at [place] Counselor from 08/13/2023 in [facility] at [place] Counselor from 07/30/2023 in [facility] at [place] Counselor from 05/21/2023 in [facility] at [facility] Total Score 6 6 6 6 5   PHQ-9 Total Score 18 19 21 17 20    Flowsheet Row Video Visit from 12/17/2023 in [facility] at [place] ED from 11/23/2023 in [facility] Emergency Department at [facility] Office Visit from 09/21/2023 in [facility] at [place]  C-SSRS RISK CATEGORY No Risk No Risk Low Risk     Assessment and Plan: Assessment: Patient seen and examined as noted above. Summary: Today [name] appears to be doing fairly well.  She reports that there has been a worsening of anxiety/depression with recent changes in her medical health.  Reports she has an umbilical hernia that needs repair and has been causing bleeding and pain.  Reports prior to this incident medications were effectively managing her mental health and does not feel that she needs any adjustments at this time related to this being situational.  She is scheduled to have surgery on 12/29/2023 will contact office if feels there is a need for medication adjustment or additional medication needed for anxiety.  Today she denies suicidal/self-harm/homicidal ideation, psychosis, paranoia, and abnormal movements.  During visit she is dressed appropriate  for age and weather.  She is seated comfortably in view of camera with no noted distress.  She is alert/oriented x 4, calm/cooperative and mood is congruent with affect.  She spoke in a clear tone at moderate volume, and normal pace, with good eye contact.  Her thought process is coherent, relevant, and there is no indication that she is currently responding to internal/external stimuli or experiencing delusional  thought content.   1. MDD (major depressive disorder), recurrent episode, moderate (HCC) - FLUoxetine  (PROZAC ) 20 MG capsule; Take 1 capsule (20 mg total) by mouth daily.  Dispense: 30 capsule; Refill: 2 - mirtazapine  (REMERON ) 15 MG tablet; Take 1 tablet (15 mg total) by mouth at bedtime.  Dispense: 30 tablet; Refill: 2  2. Insomnia, unspecified type - mirtazapine  (REMERON ) 15 MG tablet; Take 1 tablet (15 mg total) by mouth at bedtime.  Dispense: 30 tablet; Refill: 2    Plan: Medications:   Meds ordered this encounter  Medications   FLUoxetine  (PROZAC ) 20 MG capsule    Sig: Take 1 capsule (20 mg total) by mouth daily.    Dispense:  30 capsule    Refill:  2    Supervising Provider:   ARFEEN, SYED T [[id number]]   mirtazapine  (REMERON ) 15 MG tablet    Sig: Take 1 tablet (15 mg total) by mouth at bedtime.    Dispense:  30 tablet    Refill:  2    Supervising Provider:   Eduard Grad T [[id number]]    Labs: Not indicated at this time Other:  Continue counseling/therapy with Peggy Bynum, LCSW.   BRAZIL VOYTKO is instructed to call 911, [phone], mobile crisis, or present to the nearest emergency room should she experience any suicidal/homicidal ideation, auditory/visual/hallucinations, or detrimental worsening of her mental health condition.   [name] participated in the development of this treatment plan and verbalized her agreement with plan as listed.  Follow Up: 1 month for medication management Call in the interim for any side-effects, decompensation, questions, or problems  Collaboration of Care: Collaboration of Care: Medication Management AEB Medication assessment and refills  Patient/Guardian was advised Release of Information must be obtained prior to any record release in order to collaborate their care with an outside provider. Patient/Guardian was advised if they have not already done so to contact the registration department to sign all necessary forms in order for us  to  release information regarding their care.   Consent: Patient/Guardian gives verbal consent for treatment and assignment of benefits for services provided during this visit. Patient/Guardian expressed understanding and agreed to proceed.    Shameca Landen, NP 12/17/2023, 10:56 AM

## 2023-12-17 NOTE — Patient Instructions (Signed)

## 2023-12-21 NOTE — H&P (Signed)
 [facility] History and Physical   Reason for Referral: Umbilical hernia with reported bleeding?  Referring Physician: ED    Chief Complaint   Hospitalization Follow-up        [name] is a 58 y.o. female.  HPI:      [name] is a 58 yo who presented to the ED with an umbilical hernia with fat in the hernia on CT but reported bleeding from the skin. The Ed investigated and noted no bleeding or lesions. She says this still bleeds intermittently. She denies other drainage.    She also has a history of cirrhosis and was seen by [facility] hepatology in the past but has not been followed. Her CT reported the cirrhosis and some portal hypertension.    She was referred to us  for evaluation of the hernia and I reviewed the case. I felt that she needed to be seen by GI prior to any repair given the risk of cirrhosis and decompensation. She has seen Dr. Cinderella and he thinks she is stable and has low risk of decompensation. He is now following her. He thinks she is amenable to surgery at [facility].    Of note on reviewing the CT today, I noted mention of a calcified mass suprarenal and found this on CT chest from 2014. I notified the radiologist who read her CT a/p from the ED and he agreed that she does not need an MRI and addendum to the report of the CT was completed.        Past Medical History:  Diagnosis Date   Anxiety     Arthritis     Asthma     Blind     Chronic respiratory failure (HCC)     COPD (chronic obstructive pulmonary disease) (HCC)     Depression     Diabetes mellitus without complication (HCC)     Glaucoma     Hyperlipidemia     Hypertension     Hypothyroidism     PONV (postoperative nausea and vomiting)     Sleep apnea      12   Thyroid disease                 Past Surgical History:  Procedure Laterality Date   ANKLE SURGERY Right 2007   CATARACT EXTRACTION W/PHACO Left 02/27/2023    Procedure: CATARACT EXTRACTION PHACO AND  INTRAOCULAR LENS PLACEMENT (IOC);  Surgeon: Harrie Agent, MD;  Location: [facility];  Service: Ophthalmology;  Laterality: Left;  CDE 9.85   ENDOMETRIAL ABLATION       ORIF ANKLE FRACTURE Left 12/01/2012    Procedure: OPEN REDUCTION INTERNAL FIXATION (ORIF) ANKLE FRACTURE;  Surgeon: Lemond Stable, MD;  Location: [facility];  Service: Orthopedics;  Laterality: Left;   TONSILLECTOMY       TUBAL LIGATION       TUBAL LIGATION                   Family History  Problem Relation Age of Onset   Cancer Mother     Heart failure Father     Depression Daughter     Other Son          MVA          Social History  Social History         Tobacco Use   Smoking status: Every Day      Current packs/day: 0.50      Average packs/day: 0.5  packs/day for 39.0 years (19.5 ttl pk-yrs)      Types: Cigarettes   Smokeless tobacco: Never  Vaping Use   Vaping status: Never Used  Substance Use Topics   Alcohol use: No   Drug use: No        Medications: I have reviewed the patient's current medications. Allergies as of 12/15/2023         Reactions    Zoloft  [sertraline  Hcl] Diarrhea, Nausea And Vomiting    Metformin Diarrhea            Medication List           Accurate as of December 15, 2023 10:43 AM. If you have any questions, ask your nurse or doctor.              FLUoxetine  20 MG capsule Commonly known as: PROZAC  TAKE 1 CAPSULE BY MOUTH EVERY DAY    latanoprost 0.005 % ophthalmic solution Commonly known as: XALATAN Place 1 drop into both eyes at bedtime.    levothyroxine  150 MCG tablet Commonly known as: SYNTHROID  Take 100 mcg by mouth daily before breakfast.    mirtazapine  15 MG tablet Commonly known as: REMERON  Take 1 tablet (15 mg total) by mouth at bedtime.               ROS:  A comprehensive review of systems was negative except for: Gastrointestinal: positive for abdominal pain at hernia, bleeding from skin Musculoskeletal: positive for joint pain   Blood pressure  131/78, pulse (!) 59, temperature 98 F (36.7 C), temperature source Oral, resp. rate 16, height 5' 4.5 (1.638 m), weight 237 lb (107.5 kg), SpO2 92%. Physical Exam Vitals reviewed.  Constitutional:      Appearance: She is obese.  HENT:     Head: Normocephalic.     Nose: Nose normal.     Mouth/Throat:     Mouth: Mucous membranes are moist.    Eyes:     Pupils: Pupils are equal, round, and reactive to light.      Cardiovascular:     Rate and Rhythm: Normal rate and regular rhythm.  Pulmonary:     Effort: Pulmonary effort is normal.     Breath sounds: Normal breath sounds.  Abdominal:     General: There is no distension.     Palpations: Abdomen is soft.     Tenderness: There is abdominal tenderness.     Hernia: A hernia is present.     Comments: Non-reducible hernia, investigated the umbilicus which is inferior to the mass and saw no signs of opening or bleeding at this time, no skin defect noted     Musculoskeletal:        General: Normal range of motion.     Cervical back: Normal range of motion.    Skin:    General: Skin is warm.    Neurological:     General: No focal deficit present.     Mental Status: She is alert.    Psychiatric:        Mood and Affect: Mood normal.        Judgment: Judgment normal.        Results:   ADDENDUM: It has been brought to my attention that this patient has a previous chest CT from 10/20/2013 which included the upper abdomen. Although incompletely visualized on that exam, the rim calcified right adrenal mass is visible and completely stable in size through the imaged portion of the lesion comparing back  to that study from 10 years ago. As such, this lesion is considered benign and probably reflects sequelae of old hemorrhage/hematoma or infection of the adrenal gland. No further imaging follow-up indicated.     Electronically Signed   By: Camellia Candle M.D.   On: 12/16/2023 04:57 CLINICAL DATA:  Bleeding from the region of the  umbilicus.   EXAM: CT ABDOMEN AND PELVIS WITH CONTRAST   TECHNIQUE: Multidetector CT imaging of the abdomen and pelvis was performed using the standard protocol following bolus administration of intravenous contrast.   RADIATION DOSE REDUCTION: This exam was performed according to the departmental dose-optimization program which includes automated exposure control, adjustment of the mA and/or kV according to patient size and/or use of iterative reconstruction technique.   CONTRAST:  OMNIPAQUE  IOHEXOL  300 MG/ML  SOLN   COMPARISON:  None Available.   FINDINGS: Lower chest: Centrilobular emphsyema noted.  No pleural effusion.   Hepatobiliary: Nodular liver contour is compatible with cirrhosis. Multiple calcified gallstones are seen, measuring up to 13 mm. No suspicious focal abnormality within the liver parenchyma. No intrahepatic or extrahepatic biliary dilation.   Pancreas: Pancreatic parenchyma shows diffuse ill-defined margins without discrete or focal mass lesion. No dilatation of the main pancreatic duct.   Spleen: No splenomegaly. No suspicious focal mass lesion.   Adrenals/Urinary Tract: Left adrenal gland unremarkable. 7.1 x 4.2 x 5.5 cm rim calcified structures identified in the right suprarenal space. This lesion is relatively low density but somewhat heterogeneous (see image 21/2), potentially with anterior soft tissue component. Kidneys unremarkable. No evidence for hydroureter. The urinary bladder appears normal for the degree of distention.   Stomach/Bowel: Stomach is unremarkable. No gastric wall thickening. No evidence of outlet obstruction. Duodenum is normally positioned as is the ligament of Treitz. No small bowel wall thickening. No small bowel dilatation. The terminal ileum is normal. The appendix is not well visualized, but there is no edema or inflammation in the region of the cecal tip to suggest appendicitis. No gross colonic mass. No colonic wall thickening.  Diverticular changes are noted in the left colon without evidence of diverticulitis.   Vascular/Lymphatic: There is moderate atherosclerotic calcification of the abdominal aorta without aneurysm. Portal vein and superior mesenteric vein are prominent but patent. Prominent splenic vein is patent. Recanalization of the paraumbilical vein is associated with paraesophageal varices, features compatible with portal venous hypertension. There is no gastrohepatic or hepatoduodenal ligament lymphadenopathy. No retroperitoneal or mesenteric lymphadenopathy. No pelvic sidewall lymphadenopathy.   Reproductive: The uterus is unremarkable.  There is no adnexal mass.   Other: No intraperitoneal free fluid.   Musculoskeletal: No worrisome lytic or sclerotic osseous abnormality. Umbilical hernia identified containing fat and fluid. Hernia sac measures on the order of 4.5 x 2.9 x 7.5 cm. Underlying fascial defect measures approximately 1.4 x 1.6 cm. No evidence for bowel contained within the hernia.   IMPRESSION: 1. Umbilical hernia containing fat and fluid. Hernia sac measures on the order of 4.5 x 2.9 x 7.5 cm. Underlying fascial defect measures approximately 1.4 x 1.6 cm. No evidence for bowel contained within the hernia. 2. 7.1 x 4.2 x 5.5 cm rim calcified structure in the right suprarenal space. This lesion is relatively low density but somewhat heterogeneous, potentially with an anterior soft tissue component. This may be a rim calcified adrenal cyst or chronic hematoma. Nonemergent outpatient abdominal MRI with and without contrast recommended to further evaluate and exclude underlying mass lesion. 3. Cirrhosis with portal venous hypertension. 4. Cholelithiasis.  5. Left colonic diverticulosis without diverticulitis. 6.  Aortic Atherosclerosis (ICD10-I70.0).   Electronically Signed: By: Camellia Candle M.D. On: 11/23/2023 05:51   Assessment and Plan:   [name] is a 58 y.o. female with an  umbilical hernia with incarcerated fat/ omentum. She is having strange intermittent bleeding from the skin but I see no obvious lesions. I wonder if this is exacerbated by her platelet count in the 90s. No obvious vessels seen on CT.    Discussed with Dr. Cinderella the plan for open umbilical repair with mesh and he thinks she will be fine to have surgery at [facility]. Discussed with patient risk of bleeding, infection, recurrence, mesh use, cirrhosis decompensation and risk. Dr. Cinderella has put her in to his calculators and thinks she is 0.8% risk of 30 day post operative mortality VOCAL-Penn Score.    All questions were answered to the satisfaction of the patient.   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     [name] 12/15/2023, 10:43 AM

## 2023-12-25 ENCOUNTER — Other Ambulatory Visit: Payer: Self-pay

## 2023-12-25 ENCOUNTER — Encounter (HOSPITAL_COMMUNITY): Payer: Self-pay | Admitting: *Deleted

## 2023-12-25 ENCOUNTER — Encounter (HOSPITAL_COMMUNITY)
Admission: RE | Admit: 2023-12-25 | Discharge: 2023-12-25 | Disposition: A | Discharge status: Home / Self Care | Source: Ambulatory Visit | Attending: General Surgery

## 2023-12-25 NOTE — Patient Instructions (Addendum)
 [name]  12/25/2023       Your procedure is scheduled on December 29, 2023.  Report to [facility] at 8:35 A.M.  Call this number if you have problems the morning of surgery:  [phone]  If you experience any cold or flu symptoms such as cough, fever, chills, shortness of breath, etc. between now and your scheduled surgery, please notify us  at the above number.   Remember:  Do not eat after midnight.  You may drink clear liquids until  6:30 am.  Clear liquids allowed are:  Water , Juice (No red color; non-citric and without pulp; diabetics please choose diet or no sugar options), Carbonated beverages (diabetics please choose diet or no sugar options), Clear Tea (No creamer, milk, or cream, including half & half and powdered creamer), Black Coffee Only (No creamer, milk or cream, including half & half and powdered creamer), and Clear Sports drink (No red color; diabetics please choose diet or no sugar options)    Take these medicines the morning of surgery with A SIP OF WATER   Prozac ,levothyroxine      Do not wear jewelry, make-up or nail polish, including gel polish,  artificial nails, or any other type of covering on natural nails (fingers and  toes).  Do not wear lotions, powders, or perfumes, or deodorant.  Do not shave 48 hours prior to surgery.   Do not bring valuables to the hospital.  [facility] is not responsible for any belongings or valuables.  Contacts, dentures or bridgework may not be worn into surgery.  Leave your suitcase in the car.  After surgery it may be brought to your room.  For patients admitted to the hospital, discharge time will be determined by your treatment team.  Patients discharged the day of surgery will not be allowed to drive home and must have someone with them for 24 hours.   Special instructions:  DO NOT SMOKE TOBACCO OR VAPE 24 HOURS PRIOR TO YOUR PROCEDURE.    Please read over the following fact sheets that you were given. Anesthesia  Post-op Instructions and Care and Recovery After Surgery  How to Use Chlorhexidine  at Home in the Shower Chlorhexidine  gluconate (CHG) is a germ-killing (antiseptic) wash that's used to clean the skin. It can get rid of the germs that normally live on the skin and can keep them away for about 24 hours. If you're having surgery, you may be told to shower with CHG at home the night before surgery. This can help lower your risk for infection. To use CHG wash in the shower, follow the steps below. Supplies needed: CHG body wash. Clean washcloth. Clean towel. How to use CHG in the shower Follow these steps unless you're told to use CHG in a different way: Start the shower. Use your normal soap and shampoo to wash your face and hair. Turn off the shower or move out of the shower stream. Pour CHG onto a clean washcloth. Do not use any type of brush or rough sponge. Start at your neck, washing your body down to your toes. Make sure you: Wash the part of your body where the surgery will be done for at least 1 minute. Do not scrub. Do not use CHG on your head or face unless your health care provider tells you to. If it gets into your ears or eyes, rinse them well with water . Do not wash your genitals with CHG. Wash your back and under your arms. Make sure to  wash skin folds. Let the CHG sit on your skin for 1-2 minutes or as long as told. Rinse your entire body in the shower, including all body creases and folds. Turn off the shower. Dry off with a clean towel. Do not put anything on your skin afterward, such as powder, lotion, or perfume. Put on clean clothes or pajamas. If it's the night before surgery, sleep in clean sheets. General tips Use CHG only as told, and follow the instructions on the label. Use the full amount of CHG as told. This is often one bottle. Do not smoke and stay away from flames after using CHG. Your skin may feel sticky after using CHG. This is normal. The sticky  feeling will go away as the CHG dries. Do not use CHG: If you have a chlorhexidine  allergy or have reacted to chlorhexidine  in the past. On open wounds or areas of skin that have broken skin, cuts, or scrapes. On babies younger than 49 months of age. Contact a health care provider if: You have questions about using CHG. Your skin gets irritated or itchy. You have a rash after using CHG. You swallow any CHG. Call your local poison control center [phone] in the [place] Your eyes itch badly, or they become very red or swollen. Your hearing changes. You have trouble seeing. If you can't reach your provider, go to an urgent care or emergency room. Do not drive yourself. Get help right away if: You have swelling or tingling in your mouth or throat. You make high-pitched whistling sounds when you breathe, most often when you breathe out (wheeze). You have trouble breathing. These symptoms may be an emergency. Call 911 right away. Do not wait to see if the symptoms will go away. Do not drive yourself to the hospital. This information is not intended to replace advice given to you by your health care provider. Make sure you discuss any questions you have with your health care provider. Document Revised: 12/30/2022 Document Reviewed: 12/26/2021 Elsevier Patient Education  2024 [employer] Inc[employer]   General Anesthesia, Adult, Care After The following information offers guidance on how to care for yourself after your procedure. Your health care provider may also give you more specific instructions. If you have problems or questions, contact your health care provider. What can I expect after the procedure? After the procedure, it is common for people to: Have pain or discomfort at the IV site. Have nausea or vomiting. Have a sore throat or hoarseness. Have trouble concentrating. Feel cold or chills. Feel weak, sleepy, or tired (fatigue). Have soreness and body aches. These can affect parts of  the body that were not involved in surgery. Follow these instructions at home: For the time period you were told by your health care provider:  Rest. Do not participate in activities where you could fall or become injured. Do not drive or use machinery. Do not drink alcohol. Do not take sleeping pills or medicines that cause drowsiness. Do not make important decisions or sign legal documents. Do not take care of children on your own. General instructions Drink enough fluid to keep your urine pale yellow. If you have sleep apnea, surgery and certain medicines can increase your risk for breathing problems. Follow instructions from your health care provider about wearing your sleep device: Anytime you are sleeping, including during daytime naps. While taking prescription pain medicines, sleeping medicines, or medicines that make you drowsy. Return to your normal activities as told by your health care provider.  Ask your health care provider what activities are safe for you. Take over-the-counter and prescription medicines only as told by your health care provider. Do not use any products that contain nicotine or tobacco. These products include cigarettes, chewing tobacco, and vaping devices, such as e-cigarettes. These can delay incision healing after surgery. If you need help quitting, ask your health care provider. Contact a health care provider if: You have nausea or vomiting that does not get better with medicine. You vomit every time you eat or drink. You have pain that does not get better with medicine. You cannot urinate or have bloody urine. You develop a skin rash. You have a fever. Get help right away if: You have trouble breathing. You have chest pain. You vomit blood. These symptoms may be an emergency. Get help right away. Call 911. Do not wait to see if the symptoms will go away. Do not drive yourself to the hospital. Summary After the procedure, it is common to have a  sore throat, hoarseness, nausea, vomiting, or to feel weak, sleepy, or fatigue. For the time period you were told by your health care provider, do not drive or use machinery. Get help right away if you have difficulty breathing, have chest pain, or vomit blood. These symptoms may be an emergency. This information is not intended to replace advice given to you by your health care provider. Make sure you discuss any questions you have with your health care provider. Document Revised: 09/13/2021 Document Reviewed: 09/13/2021 Elsevier Patient Education  2024 Elsevier Inc.   Open Hernia Repair, Adult, Care After After an open hernia repair, it's common to have: Pain in your belly. Slight bruising. A little swelling. A small amount of blood from the cut from surgery. Follow these instructions at home: Medicines Take your medicines only as told by your health care provider. If told, take steps to prevent trouble pooping. You may need to: Drink enough fluid to keep your pee (urine) pale yellow. Take medicines to help you poop. Eat foods high in fiber. These include beans, whole grains, and fresh fruits and vegetables. Ask your provider if you should avoid driving or using machines while you're taking your medicine. If you were given antibiotics, take them as told by your provider. Do not stop taking them even if you start to feel better. Incision care  Take care of your cut from surgery as told by your provider. Make sure you: Wash your hands with soap and water  for at least 20 seconds before and after you change your bandage. If you can't use soap and water , use hand sanitizer. Change your bandage. Leave stitches or skin glue in place for at least 2 weeks. Leave tape strips alone unless you're told to take them off. You may trim the edges of the tape strips if they curl up. Check the area around your cut every day for signs of infection. Check for: More redness, swelling, or pain. More fluid  or blood. Warmth. Pus or a bad smell. Wear loose clothes while your cut heals. Activity Rest as told. You may have to avoid lifting. Ask how much weight you can safely lift. Do not play contact sports until your provider says it's safe. Return to normal activities when you're told. Ask what things are safe for you to do. General instructions If you were given a sedative during your surgery, do not drive or use machines until you're told it's safe. A sedative can make you sleepy. Do not take  baths, swim, or use a hot tub until told. Ask if showers are okay. You may need to take sponge baths only. Hold a pillow over your belly when you cough or sneeze. This helps with pain. Do not smoke, vape, or use products with nicotine or tobacco in them. If you need help quitting, talk with your provider. Your provider may give you more instructions. Make sure you know what you can and can't do. Contact a health care provider if: You have signs of infection. You have a fever or chills. You have blood in your poop. You haven't pooped in 2-3 days. Your pain doesn't get better with medicine. Get help right away if: You have chest pain. You're short of breath. You feel faint or light-headed. You have very bad pain. You start to vomit. You have pain, swelling, or redness in a leg. These symptoms may be an emergency. Call 911 right away. Do not wait to see if the symptoms will go away. Do not drive yourself to the hospital. This information is not intended to replace advice given to you by your health care provider. Make sure you discuss any questions you have with your health care provider. Document Revised: 03/19/2023 Document Reviewed: 09/09/2022 Elsevier Patient Education  2024 Elsevier Inc.  Laparoscopic Surgery for Belly Hernias: What to Know After After the procedure, it's common to have pain, discomfort, or soreness. Follow these instructions at home: Medicines Take your medicines only as  told. You may need to take steps to help treat or prevent trouble pooping (constipation), such as: Taking medicines to help you poop. Eating foods high in fiber, like beans, whole grains, and fresh fruits and vegetables. Drinking more fluids as told. Ask your health care provider if it's safe to drive or use machines while taking your medicine. Incision care  Take care of the cuts in your belly as told. Make sure you: Wash your hands with soap and water  for at least 20 seconds before and after you change your bandage. If you can't use soap and water , use hand sanitizer. Change your bandage. Leave stitches or skin glue alone. Leave tape strips alone unless you're told to take them off. You may trim the edges of the tape strips if they curl up. Check the cuts on your belly every day for signs of infection. Check for: More redness, swelling, or pain. More fluid or blood. Warmth. Pus or a bad smell. Activity Rest as told. Get up to take short walks at least every 2 hours during the day. This helps you breathe better and keeps your blood flowing. Ask for help if you feel weak or unsteady. Do not take baths, swim, or use a hot tub until you're told it's OK. Ask if you can shower. Ask if it's OK for you to lift. If you were given a sedative, do not drive or use machines until you're told it's safe. A sedative can make you sleepy. Ask what things are safe for you to do at home. Ask when you can go back to work or school. General instructions Hold a pillow over your belly when you cough or sneeze. This helps with pain. Wear a binder around your belly as told by your provider. Do not smoke, vape, or use nicotine or tobacco. Wear compression stockings to reduce swelling and help prevent blood clots in your legs. You may be asked to continue to do deep breathing exercises at home. This will help to prevent a lung infection. Contact a  health care provider if: You have any signs of infection. You  have pain that gets worse or does not get better with medicine. You throw up or you feel like throwing up. You have a cough. You have not pooped in 3 days. You are not able to pee. You have a fever. Get help right away if: You have very bad pain in your belly. You throw up every time you eat or drink. You have redness, warmth, or pain in your leg. You have chest pain. You have trouble breathing. These symptoms may be an emergency. Call 911 right away. Do not wait to see if the symptoms will go away. Do not drive yourself to the hospital. This information is not intended to replace advice given to you by your health care provider. Make sure you discuss any questions you have with your health care provider. Document Revised: 12/23/2022 Document Reviewed: 12/23/2022 Elsevier Patient Education  2024 [employer].

## 2023-12-29 ENCOUNTER — Other Ambulatory Visit: Payer: Self-pay

## 2023-12-29 ENCOUNTER — Ambulatory Visit (HOSPITAL_COMMUNITY): Admitting: Anesthesiology

## 2023-12-29 ENCOUNTER — Encounter (HOSPITAL_COMMUNITY)
Admission: RE | Disposition: A | Discharge status: Home / Self Care | Payer: Self-pay | Source: Home / Self Care | Attending: General Surgery

## 2023-12-29 ENCOUNTER — Ambulatory Visit (HOSPITAL_COMMUNITY)
Admission: RE | Admit: 2023-12-29 | Discharge: 2023-12-29 | Disposition: A | Discharge status: Home / Self Care | Attending: General Surgery | Admitting: General Surgery

## 2023-12-29 ENCOUNTER — Encounter (HOSPITAL_COMMUNITY): Payer: Self-pay | Admitting: General Surgery

## 2023-12-29 ENCOUNTER — Ambulatory Visit (HOSPITAL_COMMUNITY)

## 2023-12-29 DIAGNOSIS — K420 Umbilical hernia with obstruction, without gangrene: Secondary | ICD-10-CM | POA: Diagnosis not present

## 2023-12-29 DIAGNOSIS — E039 Hypothyroidism, unspecified: Secondary | ICD-10-CM | POA: Insufficient documentation

## 2023-12-29 DIAGNOSIS — F17210 Nicotine dependence, cigarettes, uncomplicated: Secondary | ICD-10-CM | POA: Insufficient documentation

## 2023-12-29 DIAGNOSIS — J4489 Other specified chronic obstructive pulmonary disease: Secondary | ICD-10-CM | POA: Diagnosis not present

## 2023-12-29 DIAGNOSIS — G4730 Sleep apnea, unspecified: Secondary | ICD-10-CM | POA: Diagnosis not present

## 2023-12-29 DIAGNOSIS — J449 Chronic obstructive pulmonary disease, unspecified: Secondary | ICD-10-CM | POA: Diagnosis not present

## 2023-12-29 DIAGNOSIS — K7460 Unspecified cirrhosis of liver: Secondary | ICD-10-CM | POA: Diagnosis not present

## 2023-12-29 DIAGNOSIS — K766 Portal hypertension: Secondary | ICD-10-CM | POA: Insufficient documentation

## 2023-12-29 DIAGNOSIS — E119 Type 2 diabetes mellitus without complications: Secondary | ICD-10-CM | POA: Diagnosis not present

## 2023-12-29 DIAGNOSIS — F419 Anxiety disorder, unspecified: Secondary | ICD-10-CM | POA: Insufficient documentation

## 2023-12-29 DIAGNOSIS — K429 Umbilical hernia without obstruction or gangrene: Secondary | ICD-10-CM | POA: Diagnosis not present

## 2023-12-29 DIAGNOSIS — I10 Essential (primary) hypertension: Secondary | ICD-10-CM | POA: Diagnosis not present

## 2023-12-29 DIAGNOSIS — F32A Depression, unspecified: Secondary | ICD-10-CM | POA: Insufficient documentation

## 2023-12-29 DIAGNOSIS — F17200 Nicotine dependence, unspecified, uncomplicated: Secondary | ICD-10-CM

## 2023-12-29 HISTORY — PX: UMBILICAL HERNIA REPAIR: SHX196

## 2023-12-29 SURGERY — REPAIR, HERNIA, UMBILICAL, ADULT
Anesthesia: General | Site: Abdomen

## 2023-12-29 MED ORDER — ROCURONIUM BROMIDE 10 MG/ML (PF) SYRINGE
PREFILLED_SYRINGE | INTRAVENOUS | Status: AC
  Filled 2023-12-29: qty 10

## 2023-12-29 MED ORDER — DEXAMETHASONE SODIUM PHOSPHATE 10 MG/ML IJ SOLN
INTRAMUSCULAR | Status: AC
  Filled 2023-12-29: qty 1

## 2023-12-29 MED ORDER — PROPOFOL 10 MG/ML IV BOLUS
INTRAVENOUS | Status: DC | PRN
  Administered 2023-12-29: 25 mg via INTRAVENOUS
  Administered 2023-12-29: 150 mg via INTRAVENOUS

## 2023-12-29 MED ORDER — SUCCINYLCHOLINE CHLORIDE 200 MG/10ML IV SOSY
PREFILLED_SYRINGE | INTRAVENOUS | Status: AC
  Filled 2023-12-29: qty 10

## 2023-12-29 MED ORDER — FENTANYL CITRATE (PF) 250 MCG/5ML IJ SOLN
INTRAMUSCULAR | Status: AC
  Filled 2023-12-29: qty 5

## 2023-12-29 MED ORDER — FENTANYL CITRATE (PF) 250 MCG/5ML IJ SOLN
INTRAMUSCULAR | Status: DC | PRN
  Administered 2023-12-29: 50 ug via INTRAVENOUS
  Administered 2023-12-29: 25 ug via INTRAVENOUS
  Administered 2023-12-29: 50 ug via INTRAVENOUS
  Administered 2023-12-29: 25 ug via INTRAVENOUS

## 2023-12-29 MED ORDER — EPHEDRINE 5 MG/ML INJ
INTRAVENOUS | Status: AC
  Filled 2023-12-29: qty 5

## 2023-12-29 MED ORDER — DEXAMETHASONE SODIUM PHOSPHATE 10 MG/ML IJ SOLN: 8.0000 mg | Freq: Once | INTRAMUSCULAR | Status: DC | PRN

## 2023-12-29 MED ORDER — CEFAZOLIN SODIUM-DEXTROSE 2-4 GM/100ML-% IV SOLN
2.0000 g | INTRAVENOUS | Status: AC
  Administered 2023-12-29: 2 g via INTRAVENOUS
  Filled 2023-12-29: qty 100

## 2023-12-29 MED ORDER — PHENYLEPHRINE 80 MCG/ML (10ML) SYRINGE FOR IV PUSH (FOR BLOOD PRESSURE SUPPORT)
PREFILLED_SYRINGE | INTRAVENOUS | Status: DC | PRN
  Administered 2023-12-29 (×2): 80 ug via INTRAVENOUS
  Administered 2023-12-29: 40 ug via INTRAVENOUS

## 2023-12-29 MED ORDER — GLYCOPYRROLATE PF 0.2 MG/ML IJ SOSY
PREFILLED_SYRINGE | INTRAMUSCULAR | Status: DC | PRN
  Administered 2023-12-29 (×2): .1 mg via INTRAVENOUS

## 2023-12-29 MED ORDER — PROPOFOL 10 MG/ML IV BOLUS
INTRAVENOUS | Status: AC
  Filled 2023-12-29: qty 20

## 2023-12-29 MED ORDER — OXYCODONE HCL 5 MG PO TABS: 5.0000 mg | ORAL_TABLET | Freq: Once | ORAL | Status: DC | PRN

## 2023-12-29 MED ORDER — ONDANSETRON HCL 4 MG/2ML IJ SOLN
INTRAMUSCULAR | Status: AC
  Filled 2023-12-29: qty 2

## 2023-12-29 MED ORDER — ONDANSETRON HCL 4 MG/2ML IJ SOLN
INTRAMUSCULAR | Status: DC | PRN
  Administered 2023-12-29: 4 mg via INTRAVENOUS

## 2023-12-29 MED ORDER — OXYCODONE HCL 5 MG PO TABS: 5.0000 mg | ORAL_TABLET | ORAL | 0 refills | Status: DC | PRN

## 2023-12-29 MED ORDER — ORAL CARE MOUTH RINSE: 15.0000 mL | Freq: Once | OROMUCOSAL | Status: AC

## 2023-12-29 MED ORDER — SCOPOLAMINE 1 MG/3DAYS TD PT72: 1.0000 | MEDICATED_PATCH | Freq: Once | TRANSDERMAL | Status: DC

## 2023-12-29 MED ORDER — CHLORHEXIDINE GLUCONATE CLOTH 2 % EX PADS: 6.0000 | MEDICATED_PAD | Freq: Once | CUTANEOUS | Status: DC

## 2023-12-29 MED ORDER — CHLORHEXIDINE GLUCONATE CLOTH 2 % EX PADS
6.0000 | MEDICATED_PAD | Freq: Once | CUTANEOUS | Status: AC
  Administered 2023-12-29: 6 via TOPICAL

## 2023-12-29 MED ORDER — CHLORHEXIDINE GLUCONATE 0.12 % MT SOLN
15.0000 mL | Freq: Once | OROMUCOSAL | Status: AC
  Administered 2023-12-29: 15 mL via OROMUCOSAL

## 2023-12-29 MED ORDER — SODIUM CHLORIDE 0.9 % IR SOLN
Status: DC | PRN
  Administered 2023-12-29: 1000 mL

## 2023-12-29 MED ORDER — OXYCODONE HCL 5 MG/5ML PO SOLN: 5.0000 mg | Freq: Once | ORAL | Status: DC | PRN

## 2023-12-29 MED ORDER — GLYCOPYRROLATE PF 0.2 MG/ML IJ SOSY
PREFILLED_SYRINGE | INTRAMUSCULAR | Status: AC
  Filled 2023-12-29: qty 1

## 2023-12-29 MED ORDER — BUPIVACAINE HCL (PF) 0.5 % IJ SOLN
INTRAMUSCULAR | Status: AC
  Filled 2023-12-29: qty 30

## 2023-12-29 MED ORDER — PHENYLEPHRINE 80 MCG/ML (10ML) SYRINGE FOR IV PUSH (FOR BLOOD PRESSURE SUPPORT)
PREFILLED_SYRINGE | INTRAVENOUS | Status: AC
  Filled 2023-12-29: qty 10

## 2023-12-29 MED ORDER — LIDOCAINE 2% (20 MG/ML) 5 ML SYRINGE
INTRAMUSCULAR | Status: AC
  Filled 2023-12-29: qty 5

## 2023-12-29 MED ORDER — EPHEDRINE SULFATE-NACL 50-0.9 MG/10ML-% IV SOSY
PREFILLED_SYRINGE | INTRAVENOUS | Status: DC | PRN
  Administered 2023-12-29: 5 mg via INTRAVENOUS
  Administered 2023-12-29 (×2): 10 mg via INTRAVENOUS

## 2023-12-29 MED ORDER — SUGAMMADEX SODIUM 200 MG/2ML IV SOLN
INTRAVENOUS | Status: DC | PRN
  Administered 2023-12-29: 400 mg via INTRAVENOUS

## 2023-12-29 MED ORDER — LIDOCAINE 2% (20 MG/ML) 5 ML SYRINGE
INTRAMUSCULAR | Status: DC | PRN
  Administered 2023-12-29: 60 mg via INTRAVENOUS

## 2023-12-29 MED ORDER — MIDAZOLAM HCL 2 MG/2ML IJ SOLN
INTRAMUSCULAR | Status: DC | PRN
  Administered 2023-12-29 (×2): 1 mg via INTRAVENOUS

## 2023-12-29 MED ORDER — MIDAZOLAM HCL 2 MG/2ML IJ SOLN
INTRAMUSCULAR | Status: AC
  Filled 2023-12-29: qty 2

## 2023-12-29 MED ORDER — BUPIVACAINE HCL (PF) 0.5 % IJ SOLN
INTRAMUSCULAR | Status: DC | PRN
Start: 2023-12-29 — End: 2023-12-29
  Administered 2023-12-29: 30 mL

## 2023-12-29 MED ORDER — ONDANSETRON HCL 4 MG PO TABS: 4.0000 mg | ORAL_TABLET | Freq: Three times a day (TID) | ORAL | 1 refills | Status: DC | PRN

## 2023-12-29 MED ORDER — FENTANYL CITRATE PF 50 MCG/ML IJ SOSY: 25.0000 ug | PREFILLED_SYRINGE | INTRAMUSCULAR | Status: DC | PRN

## 2023-12-29 MED ORDER — LACTATED RINGERS IV SOLN: INTRAVENOUS | Status: DC

## 2023-12-29 MED ORDER — SCOPOLAMINE 1 MG/3DAYS TD PT72
MEDICATED_PATCH | TRANSDERMAL | Status: AC
  Administered 2023-12-29: 1.5 mg via TRANSDERMAL
  Filled 2023-12-29: qty 1

## 2023-12-29 MED ORDER — DEXAMETHASONE SODIUM PHOSPHATE 10 MG/ML IJ SOLN
INTRAMUSCULAR | Status: DC | PRN
  Administered 2023-12-29: 4 mg via INTRAVENOUS

## 2023-12-29 MED ORDER — ROCURONIUM BROMIDE 10 MG/ML (PF) SYRINGE
PREFILLED_SYRINGE | INTRAVENOUS | Status: DC | PRN
  Administered 2023-12-29: 40 mg via INTRAVENOUS

## 2023-12-29 SURGICAL SUPPLY — 27 items
BLADE SURG 15 STRL LF DISP TIS (BLADE) ×1 IMPLANT
CHLORAPREP W/TINT 26 (MISCELLANEOUS) ×1 IMPLANT
CLOTH BEACON ORANGE TIMEOUT ST (SAFETY) ×1 IMPLANT
COVER LIGHT HANDLE STERIS (MISCELLANEOUS) ×2 IMPLANT
DERMABOND ADVANCED .7 DNX12 (GAUZE/BANDAGES/DRESSINGS) ×1 IMPLANT
DRSG OPSITE POSTOP 4X8 (GAUZE/BANDAGES/DRESSINGS) IMPLANT
ELECTRODE REM PT RTRN 9FT ADLT (ELECTROSURGICAL) ×1 IMPLANT
GLOVE BIO SURGEON STRL SZ 6.5 (GLOVE) ×1 IMPLANT
GLOVE BIOGEL PI IND STRL 6.5 (GLOVE) ×1 IMPLANT
GLOVE BIOGEL PI IND STRL 7.0 (GLOVE) ×2 IMPLANT
GOWN STRL REUS W/TWL LRG LVL3 (GOWN DISPOSABLE) ×2 IMPLANT
KIT TURNOVER KIT A (KITS) ×1 IMPLANT
LIGASURE IMPACT 36 18CM CVD LR (INSTRUMENTS) IMPLANT
MANIFOLD NEPTUNE II (INSTRUMENTS) ×1 IMPLANT
MESH VENTRALEX ST 1-7/10 CRC S (Mesh General) IMPLANT
NDL HYPO 21X1.5 SAFETY (NEEDLE) ×1 IMPLANT
NEEDLE HYPO 21X1.5 SAFETY (NEEDLE) ×1 IMPLANT
NS IRRIG 1000ML POUR BTL (IV SOLUTION) ×1 IMPLANT
PACK MINOR (CUSTOM PROCEDURE TRAY) ×1 IMPLANT
PAD ARMBOARD POSITIONER FOAM (MISCELLANEOUS) ×1 IMPLANT
POSITIONER HEAD 8X9X4 ADT (SOFTGOODS) ×1 IMPLANT
SET BASIN LINEN APH (SET/KITS/TRAYS/PACK) ×1 IMPLANT
SUT ETHIBOND NAB MO 7 #0 18IN (SUTURE) ×1 IMPLANT
SUT ETHILON 3 0 PS 1 (SUTURE) IMPLANT
SUT MNCRL AB 4-0 PS2 18 (SUTURE) ×1 IMPLANT
SUT VIC AB 3-0 SH 27X BRD (SUTURE) ×1 IMPLANT
SYR 30ML LL (SYRINGE) ×2 IMPLANT

## 2023-12-29 NOTE — Interval H&P Note (Signed)
 History and Physical Interval Note:  12/29/2023 10:13 AM  [name]  has presented today for surgery, with the diagnosis of HERNIA, UMBILICAL 2 CM.  The various methods of treatment have been discussed with the patient and family. After consideration of risks, benefits and other options for treatment, the patient has consented to  Procedure(s) with comments: REPAIR, HERNIA, UMBILICAL, ADULT (N/A) - W/ MESH as a surgical intervention.  The patient's history has been reviewed, patient examined, no change in status, stable for surgery.  I have reviewed the patient's chart and labs.  Questions were answered to the patient's satisfaction.     [name]

## 2023-12-29 NOTE — Transfer of Care (Signed)
 Immediate Anesthesia Transfer of Care Note  Patient: [name]  Procedure(s) Performed: REPAIR, HERNIA, UMBILICAL, ADULT WITH MESH (Abdomen)  Patient Location: PACU  Anesthesia Type:General  Level of Consciousness: awake and patient cooperative  Airway & Oxygen  Therapy: Patient Spontanous Breathing and Patient connected to nasal cannula oxygen   Post-op Assessment: Report given to RN and Post -op Vital signs reviewed and stable  Post vital signs: Reviewed and stable  Last Vitals:  Vitals Value Taken Time  BP 96/56 12/29/23 11:47  Temp 36.4 C 12/29/23 11:45  Pulse 81 12/29/23 11:49  Resp 21 12/29/23 11:49  SpO2 95 % 12/29/23 11:49  Vitals shown include unfiled device data.  Last Pain:  Vitals:   12/29/23 0909  TempSrc: Oral  PainSc: 4       Patients Stated Pain Goal: 6 (12/29/23 0909)  Complications: No notable events documented.

## 2023-12-29 NOTE — Op Note (Signed)
 [facility] Operative Note  12/29/23  Preoperative Diagnosis: Umbilical hernia with incarcerated omentum   Postoperative Diagnosis: Same   Procedure(s) Performed: Umbilical hernia repair with mesh 4.3cm Ventralex    Surgeon: Julie BROCKS. Kallie, MD   Assistants: No qualified resident was available    Anesthesia: General endotracheal   Anesthesiologist: Julie Hollering, MD    Specimens: Sac and omentum   Estimated Blood Loss: 10cc   Blood Replacement: None    Complications: None   Wound Class: Clean    Operative Indications: [name] is a 58 yo with cirrhosis and a umbilical hernia with omentum and visits to the ED for bleeding from the umbilicus. On my exam I noted no signs of lesion in the umbilicus but felt a large incarcerated omental mass that corresponded to her CT scan. We discussed open repair with possible mesh, issues with her cirrhosis and decompensation. I discussed the case with GI, [name] who felt like it was safe to do her hernia surgery at [facility] based on her risk scores and sequale from her cirrhosis. Given that she had been to the ED and bleeding, I felt like she needed surgery sooner and did not want to run the risk of her getting referred to another location and delaying her repair.   Findings: 1.5cm fascial defect, omentum incarcerated    Procedure: The patient was taken to the operating room and placed supine. General endotracheal anesthesia was induced. Intravenous antibiotics were  administered per protocol.  The abdomen was prepared and draped in the usual sterile fashion.   The umbilical hernia was just above the umbilicus and was not reducible. A vertical incision was made superior to the umbilicus and carried around the umbilicus on the left side. This was carried down through the subcutaneous tissue with electrocautery.  Dissection was performed down to the level of the fascia, exposing the hernia sac.  The hernia sac was  opened with care, and excess hernia sac was resected with electrocautery.  There was a small amount of ascites in the sac corresponding to the CT scan imaging. The omentum was incarcerated and was ligated in thin layers with a Ligaure to ensure no bleeding.  A gentle finger was ran on the underlying peritoneum and this was clear.  The defect was 1.5cm in size. A 4.3 cm Ventralex St Hernia Patch was placed and secured with 0 Ethibond sutures ensuring that it was against the peritoneal cavity.  The hernia defect was then closed with 0 Ethibond suture in an interrupted fashion over the patch. In removing the hernia sac from the umbilical stalk,  there was a small button hole of the skin. I imagine her skin was thin in this area and the hernia sac was leaking from it on occasion.  I closed this in layers, closing the skin with subcuticular Monocryl and closing the deeper tissue with 3-0 Vicryl to buttress the skin.  The umbilicus was tacked to the fascia with a 3-0 Vicryl suture.   The cavity was closed with 3-0 Vicryl suture. Hemostasis was confirmed. The skin was closed with 3-0 Nylon in a Vertical Mattress fashion to help evert the skin and help with healing given her cirrhosis.  Dermabond was placed into the umbilicus where the button hole was closed and over the incision at the top of the skin closure.  A honeycomb dressing was placed over the repair after the glue dried.   All counts were correct at the end of the case.  The patient was awakened from anesthesia and extubated without complication.  The patient went to the PACU in stable condition.   Julie Pander, MD [facility] [address] [place], [place] [address] [phone] (office)

## 2023-12-29 NOTE — Interval H&P Note (Signed)
 History and Physical Interval Note:  12/29/2023 10:15 AM  [name]  has presented today for surgery, with the diagnosis of HERNIA, UMBILICAL 2 CM.  The various methods of treatment have been discussed with the patient and family. After consideration of risks, benefits and other options for treatment, the patient has consented to  Procedure(s) with comments: REPAIR, HERNIA, UMBILICAL, ADULT (N/A) - W/ MESH as a surgical intervention.  The patient's history has been reviewed, patient examined, no change in status, stable for surgery.  I have reviewed the patient's chart and labs.  Questions were answered to the patient's satisfaction.    Discussed her cirhosis, Dr. Orion feeling she is say for a small open hernia repair here at [facility]. Discussed decompensation and things to look out for, leaking from wound, ascites/ swelling of abdomen. Discussed and showed her family the fat containing hernia. May put in sutures that we remove in a few weeks, will decide once in surgery.   [name]

## 2023-12-29 NOTE — Progress Notes (Signed)
 [facility]  Updated family. Hernia repair went well. Mesh used. Closed skin with combination of sutures that I will remove and skin glue.   Will send in Rx.  Will see in the office 7/23 to remove the sutures.  [name], MD [facility] [address] [place], [place] [address] [phone] (office)

## 2023-12-29 NOTE — Anesthesia Procedure Notes (Signed)
 Procedure Name: Intubation Date/Time: 12/29/2023 10:36 AM  Performed by: Augusta Daved SAILOR, CRNAPre-anesthesia Checklist: Patient identified, Emergency Drugs available, Suction available and Patient being monitored Patient Re-evaluated:Patient Re-evaluated prior to induction Oxygen  Delivery Method: Circle System Utilized Preoxygenation: Pre-oxygenation with 100% oxygen  Induction Type: IV induction Ventilation: Oral airway inserted - appropriate to patient size Laryngoscope Size: Glidescope and 3 Grade View: Grade II Tube type: Oral Tube size: 7.0 mm Number of attempts: 1 Airway Equipment and Method: Stylet and Oral airway Placement Confirmation: ETT inserted through vocal cords under direct vision, positive ETCO2 and breath sounds checked- equal and bilateral Secured at: 20 cm Tube secured with: Tape Dental Injury: Teeth and Oropharynx as per pre-operative assessment

## 2023-12-29 NOTE — Anesthesia Postprocedure Evaluation (Signed)
 Anesthesia Post Note  Patient: DARINA HARTWELL  Procedure(s) Performed: REPAIR, HERNIA, UMBILICAL, ADULT WITH MESH (Abdomen)  Patient location during evaluation: PACU Anesthesia Type: General Level of consciousness: awake and alert Pain management: pain level controlled Vital Signs Assessment: post-procedure vital signs reviewed and stable Respiratory status: spontaneous breathing, nonlabored ventilation, respiratory function stable and patient connected to nasal cannula oxygen  Cardiovascular status: blood pressure returned to baseline and stable Postop Assessment: no apparent nausea or vomiting Anesthetic complications: no  No notable events documented.   Last Vitals:  Vitals:   12/29/23 1225 12/29/23 1230  BP:  105/68  Pulse: 64 65  Resp: 16 17  Temp:    SpO2: 98% 96%    Last Pain:  Vitals:   12/29/23 1230  TempSrc:   PainSc: 0-No pain                 Andrea Limes

## 2023-12-29 NOTE — Discharge Instructions (Signed)
 Discharge Instructions Hernia:  Common Complaints: Pain at the incision site is common. This will improve with time. Take your pain medications as described below. Some nausea is common and poor appetite. The main goal is to stay hydrated the first few days after surgery.  Some bruising can occur at the incision. If you have any bleeding, you should hold firm pressure (knuckles turning white) on the area for 10 minutes. This should stop any bleeding from the skin incision.  If your dressing gets saturated you can replace it with gauze and paper tape.   Cirrhosis Monitoring:  Call with any signs of your abdomen swelling or having excessive drainage from your incision. A small amount of drainage initially in the first 3 days is common, but any drainage or leakage after that would be concerning.   Diet/ Activity: Diet as tolerated. You may not have an appetite, but it is important to stay hydrated. Drink 64 ounces of water  a day. Your appetite will return with time.  You can shower with the dressing in place 12/30/23. You can remove the dressing on 01/01/24. After you remove the dressing you can take showers and the incision can get wet. You cannot Submerge in water .  Shower per your regular routine daily.  Do not take hot showers, this will disrupt the glue. Take warm showers that are less than 10 minutes. Pat the incision dry.  Rest and listen to your body, but do not remain in bed all day.Walk everyday for at least 15-20 minutes.  Deep cough and move around every 1-2 hours in the first few days after surgery. Do not pick at the dermabond glue on your incision sites.  This glue film will remain in place for 1-2 weeks and will start to peel off. Do not place lotions or balms on your incision unless instructed to specifically by Dr. Kallie. Do not lift > 10 lbs, perform excessive bending, pushing, pulling, squatting for 6-8 weeks after surgery. Where your abdominal binder with activity as much as  possible. The activity restrictions and the abdominal binder are to prevent hernia formation at your incision while you are healing.   Pain Expectations and Narcotics: -After surgery you will have pain associated with your incisions and this is normal. The pain is muscular and nerve pain, and will get better with time. -You are encouraged and expected to take non narcotic medications like tylenol  and ibuprofen (when able) to treat pain as multiple modalities can aid with pain treatment. -Narcotics are only used when pain is severe or there is breakthrough pain. -You are not expected to have a pain score of 0 after surgery, as we cannot prevent pain. A pain score of 3-4 that allows you to be functional, move, walk, and tolerate some activity is the goal. The pain will continue to improve over the days after surgery and is dependent on your surgery. -Due to [place] law, we are only able to give a certain amount of pain medication to treat post operative pain, and we only give additional narcotics on a patient by patient basis.  -For most laparoscopic surgery, studies have shown that the majority of patients only need 10-15 narcotic pills, and for open surgeries most patients only need 15-20.   -Having appropriate expectations of pain and knowledge of pain management with non narcotics is important as we do not want anyone to become addicted to narcotic pain medication.  -Using ice packs in the first 48 hours and heating pads after 48  hours, wearing an abdominal binder (when recommended), and using over the counter medications are all ways to help with pain management.   -Simple acts like meditation and mindfulness practices after surgery can also help with pain control and research has proven the benefit of these practices.  Medication: With your cirrhosis and your low platelets. I would try to avoid using much tylenol  (liver metabolized) or ibuprofen (can cause bleeding with the low platelets).  Take  Roxicodone  for breakthrough pain every 4 hours.  Take Colace for constipation related to narcotic pain medication. If you do not have a bowel movement in 2 days, take Miralax over the counter.  Drink plenty of water  to also prevent constipation.   Contact Information: If you have questions or concerns, please call our office, [phone], Monday- Thursday 8AM-5PM and Friday 8AM-12Noon.  If it is after hours or on the weekend, please call [facility] Main Number, [phone], [phone], and ask to speak to the surgeon on call for Dr. Kallie at [facility]

## 2023-12-29 NOTE — Anesthesia Preprocedure Evaluation (Addendum)
 Anesthesia Evaluation  Patient identified by MRN, date of birth, ID band Patient awake    Reviewed: Allergy & Precautions, H&P , NPO status , Patient's Chart, lab work & pertinent test results  History of Anesthesia Complications (+) PONV and history of anesthetic complications  Airway Mallampati: III  TM Distance: >3 FB Neck ROM: Full    Dental  (+) Edentulous Upper, Edentulous Lower   Pulmonary asthma , sleep apnea , COPD, Current Smoker and Patient abstained from smoking.   Pulmonary exam normal breath sounds clear to auscultation       Cardiovascular hypertension, Normal cardiovascular exam Rhythm:Regular Rate:Normal  Pt denies HTN and takes no medication EKG ordered this am: SB no acute abnormalities Patient denies any cardiac history or symptoms   Neuro/Psych  PSYCHIATRIC DISORDERS Anxiety Depression    negative neurological ROS     GI/Hepatic negative GI ROS, Neg liver ROS,,,  Endo/Other  diabetesHypothyroidism  Class 3 obesity  Renal/GU negative Renal ROS  negative genitourinary   Musculoskeletal  (+) Arthritis ,    Abdominal  (+) + obese  Peds negative pediatric ROS (+)  Hematology negative hematology ROS (+)   Anesthesia Other Findings   Reproductive/Obstetrics negative OB ROS                             Anesthesia Physical Anesthesia Plan  ASA: 3  Anesthesia Plan: General   Post-op Pain Management:    Induction: Intravenous  PONV Risk Score and Plan:   Airway Management Planned: Oral ETT  Additional Equipment:   Intra-op Plan:   Post-operative Plan: Extubation in OR  Informed Consent: I have reviewed the patients History and Physical, chart, labs and discussed the procedure including the risks, benefits and alternatives for the proposed anesthesia with the patient or authorized representative who has indicated his/her understanding and acceptance.     Dental  advisory given  Plan Discussed with: CRNA  Anesthesia Plan Comments:        Anesthesia Quick Evaluation

## 2023-12-30 ENCOUNTER — Encounter (HOSPITAL_COMMUNITY): Payer: Self-pay | Admitting: General Surgery

## 2023-12-30 ENCOUNTER — Ambulatory Visit (HOSPITAL_COMMUNITY): Admitting: Psychiatry

## 2023-12-31 LAB — SURGICAL PATHOLOGY

## 2024-01-18 ENCOUNTER — Telehealth (INDEPENDENT_AMBULATORY_CARE_PROVIDER_SITE_OTHER): Admitting: Registered Nurse

## 2024-01-18 ENCOUNTER — Encounter (HOSPITAL_COMMUNITY): Payer: Self-pay | Admitting: Registered Nurse

## 2024-01-18 DIAGNOSIS — G4700 Insomnia, unspecified: Secondary | ICD-10-CM | POA: Diagnosis not present

## 2024-01-18 DIAGNOSIS — F331 Major depressive disorder, recurrent, moderate: Secondary | ICD-10-CM | POA: Diagnosis not present

## 2024-01-18 MED ORDER — MIRTAZAPINE 15 MG PO TABS: 15.0000 mg | ORAL_TABLET | Freq: Every day | ORAL | 3 refills | Status: DC

## 2024-01-18 MED ORDER — FLUOXETINE HCL 20 MG PO CAPS: 20.0000 mg | ORAL_CAPSULE | Freq: Every day | ORAL | 3 refills | Status: DC

## 2024-01-18 NOTE — Patient Instructions (Signed)

## 2024-01-18 NOTE — Progress Notes (Signed)
 [facility] MD/PA/NP OP Progress Note  01/18/2024 9:41 AM AYRA HODGDON  MRN:  [id number]   Virtual Visit via Video Note  I connected with [name] on 01/18/24 at  9:30 AM EDT by a video enabled telemedicine application and verified that I am speaking with the correct person using two identifiers.  Location: Patient: Home Provider: Home office   I discussed the limitations of evaluation and management by telemedicine and the availability of in person appointments. The patient expressed understanding and agreed to proceed.  I discussed the assessment and treatment plan with the patient. The patient was provided an opportunity to ask questions and all were answered. The patient agreed with the plan and demonstrated an understanding of the instructions.   The patient was advised to call back or seek an in-person evaluation if the symptoms worsen or if the condition fails to improve as anticipated.  I provided 30 minutes of non-face-to-face time during this encounter.   Julie Ruder, NP   Chief Complaint:  Chief Complaint  Patient presents with   Follow-up    Medication management   HPI: [name] 58 y.o. female presents to office today for medication management follow up.  She is seen face to face by this provider, and chart reviewed on 01/18/24.  Her psychiatric history is significant for major depression, general anxiety, PTSD, and insomnia.  Her mental health is currently managed with Prozac  20 mg daily and Remeron  15 mg Q hs. she reports current medication regimen is effectively managing her mental health without any adverse reaction.  She reports since she had her surgery 4 weeks ago things have began to settle down again and she can tell that her medication continues to work.  She reports she is eating and sleeping without any difficulty.  Today she denies suicidal/self-harm/homicidal ideations, psychosis, paranoia, and abnormal movements.        Recommended the  following:  Continue Prozac  20 mg daily and Remeron  15 mg at bedtime.  She voiced her understanding and agreement with recommendations.        Visit Diagnosis:    ICD-10-CM   1. MDD (major depressive disorder), recurrent episode, moderate (HCC)  F33.1 mirtazapine  (REMERON ) 15 MG tablet    FLUoxetine  (PROZAC ) 20 MG capsule    2. Insomnia, unspecified type  G47.00 mirtazapine  (REMERON ) 15 MG tablet      Past Psychiatric History: major depression, general anxiety, PTSD, and insomnia.   Past Medical History:  Past Medical History:  Diagnosis Date   Anxiety    Arthritis    Asthma    Blind    Chronic respiratory failure (HCC)    COPD (chronic obstructive pulmonary disease) (HCC)    Depression    Diabetes mellitus without complication (HCC)    Glaucoma    Hyperlipidemia    Hypothyroidism    PONV (postoperative nausea and vomiting)    Sleep apnea    12   Thyroid disease     Past Surgical History:  Procedure Laterality Date   ANKLE SURGERY Right 2007   CATARACT EXTRACTION W/PHACO Left 02/27/2023   Procedure: CATARACT EXTRACTION PHACO AND INTRAOCULAR LENS PLACEMENT (IOC);  Surgeon: Harrie Agent, MD;  Location: [facility];  Service: Ophthalmology;  Laterality: Left;  CDE 9.85   ENDOMETRIAL ABLATION     ORIF ANKLE FRACTURE Left 12/01/2012   Procedure: OPEN REDUCTION INTERNAL FIXATION (ORIF) ANKLE FRACTURE;  Surgeon: Lemond Stable, MD;  Location: [facility];  Service: Orthopedics;  Laterality: Left;  TONSILLECTOMY     TUBAL LIGATION     TUBAL LIGATION     UMBILICAL HERNIA REPAIR N/A 12/29/2023   Procedure: REPAIR, HERNIA, UMBILICAL, ADULT WITH MESH;  Surgeon: Kallie Manuelita BROCKS, MD;  Location: [facility];  Service: General;  Laterality: N/A;  W/ MESH    Family Psychiatric History: See below in family history  Family History:  Family History  Problem Relation Age of Onset   Cancer Mother    Heart failure Father    Depression Daughter    Other Son        MVA    Social History:  Social  History   Socioeconomic History   Marital status: Widowed    Spouse name: Not on file   Number of children: 2   Years of education: 55   Highest education level: Not on file  Occupational History   Occupation: N/A  Tobacco Use   Smoking status: Every Day    Current packs/day: 0.50    Average packs/day: 0.5 packs/day for 39.0 years (19.5 ttl pk-yrs)    Types: Cigarettes   Smokeless tobacco: Never  Vaping Use   Vaping status: Never Used  Substance and Sexual Activity   Alcohol use: No   Drug use: No   Sexual activity: Not Currently    Birth control/protection: Surgical    Comment: tubal  Other Topics Concern   Not on file  Social History Narrative   Lives at home w/ roommates   Right-handed   Caffeine: occasional coffee   Social Drivers of Corporate investment banker Strain: Low Risk  (01/17/2021)   [facility]   Overall Financial Resource Strain (CARDIA)    Difficulty of Paying Living Expenses: Not hard at all  Food Insecurity: No Food Insecurity (01/17/2021)   Received from [facility]   Hunger Vital Sign    Within the past 12 months, you worried that your food would run out before you got the money to buy more.: Never true    Within the past 12 months, the food you bought just didn't last and you didn't have money to get more.: Never true  Transportation Needs: No Transportation Needs (01/17/2021)   Received from [facility]   PRAPARE - Transportation    Lack of Transportation (Medical): No    Lack of Transportation (Non-Medical): No  Physical Activity: Sufficiently Active (01/17/2021)   Received from [facility]   Exercise Vital Sign    On average, how many days per week do you engage in moderate to strenuous exercise (like a brisk walk)?: 7 days    On average, how many minutes do you engage in exercise at this level?: 30 min  Stress: No Stress Concern Present (01/17/2021)   Received from [facility]  Occupational Health - Occupational Stress Questionnaire    Feeling of Stress : Not at all  Social Connections: Moderately Isolated (01/17/2021)   Received from [facility]   Social Connection and Isolation Panel    In a typical week, how many times do you talk on the phone with family, friends, or neighbors?: More than three times a week    How often do you get together with friends or relatives?: Once a week    How often do you attend church or religious services?: More than 4 times per year    Do you belong to any clubs or organizations such as church groups, unions,  fraternal or athletic groups, or school groups?: No    How often do you attend meetings of the clubs or organizations you belong to?: Never    Are you married, widowed, divorced, separated, never married, or living with a partner?: Widowed    Allergies:  Allergies  Allergen Reactions   Zoloft  [Sertraline  Hcl] Diarrhea and Nausea And Vomiting   Metformin Diarrhea    Metabolic Disorder Labs:   Lab Results  Component Value Date   HGBA1C 5.4 09/21/2023   MPG 108 09/21/2023   MPG 143 04/22/2018   No results found for: PROLACTIN Lab Results  Component Value Date   CHOL 221 (H) 09/21/2023   TRIG 60 09/21/2023   HDL 62 09/21/2023   CHOLHDL 3.6 09/21/2023   LDLCALC 144 (H) 09/21/2023   Lab Results  Component Value Date   TSH 0.13 (L) 09/21/2023   TSH 3.472 12/01/2012    Current Medications: Current Outpatient Medications  Medication Sig Dispense Refill   FLUoxetine  (PROZAC ) 20 MG capsule Take 1 capsule (20 mg total) by mouth daily. 30 capsule 3   latanoprost (XALATAN) 0.005 % ophthalmic solution Place 1 drop into both eyes at bedtime.      levothyroxine  (SYNTHROID ) 150 MCG tablet Take 100 mcg by mouth at bedtime.     mirtazapine  (REMERON ) 15 MG tablet Take 1 tablet (15 mg total) by mouth at bedtime. 30 tablet 3   ondansetron  (ZOFRAN ) 4 MG tablet Take 1 tablet (4 mg total) by mouth every 8 (eight) hours as  needed. 30 tablet 1   oxyCODONE  (ROXICODONE ) 5 MG immediate release tablet Take 1 tablet (5 mg total) by mouth every 4 (four) hours as needed for severe pain (pain score 7-10) or breakthrough pain. 10 tablet 0   No current facility-administered medications for this visit.     Musculoskeletal: Strength & Muscle Tone: Unable to assess via virtual visit Gait & Station: Unable to assess via virtual visit Patient leans: N/A  Psychiatric Specialty Exam: Review of Systems  Constitutional:        No other complaints voiced at this time  Gastrointestinal:  Negative for abdominal distention, abdominal pain, diarrhea, nausea and vomiting.       Recently diagnosed with umbilical hernia that needs repair.  At this time causing bleeding and pain.  Scheduled for hernia repair surgery on 12/29/2023  Psychiatric/Behavioral:  Positive for dysphoric mood (Stable) and sleep disturbance (Stable). Negative for hallucinations, self-injury and suicidal ideas. The patient is not nervous/anxious.   All other systems reviewed and are negative.   Last menstrual period 10/20/2013.There is no height or weight on file to calculate BMI.  General Appearance: Casual  Eye Contact:  Good  Speech:  Clear and Coherent and Normal Rate  Volume:  Normal  Mood:  Euthymic  Affect:  Appropriate and Congruent  Thought Process:  Coherent, Goal Directed, and Descriptions of Associations: Intact  Orientation:  Full (Time, Place, and Person)  Thought Content: WDL and Logical   Suicidal Thoughts:  No  Homicidal Thoughts:  No  Memory:  Immediate;   Good Recent;   Good Remote;   Good  Judgement:  Intact  Insight:  Present  Psychomotor Activity:  Normal  Concentration:  Concentration: Good and Attention Span: Good  Recall:  Good  Fund of Knowledge: Good  Language: Good  Akathisia:  No  Handed:  Right  AIMS (if indicated): done  Assets:  Communication Skills Desire for Improvement Financial  Resources/Insurance Housing Leisure Time Resilience Social Support  Transportation  ADL's:  Intact  Cognition: WNL  Sleep:  Good   Screenings: AIMS    Flowsheet Row Office Visit from 09/21/2023 in [place] [facility] at [facility]  AIMS Total Score 0   GAD-7    Flowsheet Row Video Visit from 12/17/2023 in [facility] at [place] Office Visit from 09/21/2023 in [facility] at [place] Counselor from 07/30/2023 in [facility] at [place] Counselor from 05/21/2023 in [facility] at [place]  Total GAD-7 Score 21 21 21 21    PHQ2-9    Flowsheet Row Video Visit from 12/17/2023 in [facility] at [place] Office Visit from 09/21/2023 in [place] [facility] at [place] Counselor from 08/13/2023 in [facility] at [place] Counselor from 07/30/2023 in [facility] at [place] Counselor from 05/21/2023 in [facility] [facility] at [facility] Total Score 6 6 6 6 5   PHQ-9 Total Score 18 19 21 17 20    Flowsheet Row Admission (Discharged) from 12/29/2023 in [facility] Video Visit from 12/17/2023 in [facility] at [place] ED from 11/23/2023 in [facility] Emergency Department at [facility]  C-SSRS RISK CATEGORY No Risk No Risk No Risk     Assessment and Plan: Assessment: Patient seen and examined as noted above. Summary: Today [name] appears to be doing well.  She reports current medication regimen is effectively managing her mental health without any adverse reactions.  She reports medications are working and continues to have improvement in depression.  Reports things settle down after her surgery 4 weeks ago.  Reports surgery for umbilical hernia went well.   She denies suicidal/self-harm/homicidal ideation, psychosis, paranoia, and abnormal movements. During visit she is dressed appropriate for age and weather.  She is seated comfortably in view of camera with no noted distress.  She is alert/oriented x 4, calm/cooperative and mood is congruent with affect.  She spoke in a clear tone at moderate volume, and normal pace, with good eye contact.  Her thought process is coherent, relevant, and there is no indication that she is currently responding to internal/external stimuli or experiencing delusional thought content.    1. MDD (major depressive disorder), recurrent episode, moderate (HCC) (Primary) - mirtazapine  (REMERON ) 15 MG tablet; Take 1 tablet (15 mg total) by mouth at bedtime.  Dispense: 30 tablet; Refill: 3 - FLUoxetine  (PROZAC ) 20 MG capsule; Take 1 capsule (20 mg total) by mouth daily.  Dispense: 30 capsule; Refill: 3  2. Insomnia, unspecified type - mirtazapine  (REMERON ) 15 MG tablet; Take 1 tablet (15 mg total) by mouth at bedtime.  Dispense: 30 tablet; Refill: 3  Plan: Medications:   Meds ordered this encounter  Medications   mirtazapine  (REMERON ) 15 MG tablet    Sig: Take 1 tablet (15 mg total) by mouth at bedtime.    Dispense:  30 tablet    Refill:  3    Supervising Provider:   ARFEEN, SYED T [[id number]]   FLUoxetine  (PROZAC ) 20 MG capsule    Sig: Take 1 capsule (20 mg total) by mouth daily.    Dispense:  30 capsule    Refill:  3    Supervising Provider:   CURRY PATERSON T [[id number]]    Labs: Not indicated at this time Other:  Continue counseling/therapy with Julie Bynum, LCSW.   JAIANA SHEFFER is instructed to  call 911, [phone], mobile crisis, or present to the nearest emergency room should she experience any suicidal/homicidal ideation, auditory/visual/hallucinations, or detrimental worsening of her mental health condition.   [name] participated in the development of this treatment plan and verbalized her agreement with plan  as listed.  Follow Up: 3 month for medication management Call in the interim for any side-effects, decompensation, questions, or problems  Collaboration of Care: Collaboration of Care: Medication Management AEB medication assessment and refills  Patient/Guardian was advised Release of Information must be obtained prior to any record release in order to collaborate their care with an outside provider. Patient/Guardian was advised if they have not already done so to contact the registration department to sign all necessary forms in order for us  to release information regarding their care.   Consent: Patient/Guardian gives verbal consent for treatment and assignment of benefits for services provided during this visit. Patient/Guardian expressed understanding and agreed to proceed.    Aishwarya Shiplett, NP 01/18/2024, 9:41 AM

## 2024-01-20 ENCOUNTER — Ambulatory Visit (INDEPENDENT_AMBULATORY_CARE_PROVIDER_SITE_OTHER): Admitting: General Surgery

## 2024-01-20 ENCOUNTER — Encounter: Payer: Self-pay | Admitting: General Surgery

## 2024-01-20 VITALS — BP 137/83 | HR 53 | Temp 97.7°F | Resp 16 | Ht 64.5 in | Wt 229.0 lb

## 2024-01-20 DIAGNOSIS — K429 Umbilical hernia without obstruction or gangrene: Secondary | ICD-10-CM

## 2024-01-20 MED ORDER — NYSTATIN 100000 UNIT/GM EX POWD: 1.0000 | Freq: Three times a day (TID) | CUTANEOUS | 0 refills | Status: DC

## 2024-01-20 NOTE — Progress Notes (Signed)
 [facility]  Doing fair. No complaints. No blood or drainage from the wound. Says it is itching some.  BP 137/83   Pulse (!) 53   Temp 97.7 F (36.5 C) (Oral)   Resp 16   Ht 5' 4.5 (1.638 m)   Wt 229 lb (103.9 kg)   LMP 10/20/2013   SpO2 93%   BMI 38.70 kg/m  Sutures in place, incision healed, no drainage noted, sutures removed, left side of the umbilical incision with yeast infection, no other signs of cellulitis or deeper infection No signs of recurrence  Patient s/p umbilical hernia repair with mesh in the setting of cirrhosis. I had placed sutures and glue to keep the area closed. Overall area is healed but she has some yeast around the incision from moisture per her report.  Area marked.  Powder to the belly button area. Keep the area dry between applications. Call with any changes or worsening of the area/ redness worsening.   Future Appointments  Date Time Provider Department Center  01/28/2024  2:45 PM Kallie Manuelita BROCKS, MD [facility] None  02/08/2024  1:00 PM Dante Winton [place], LCSW [facility] None  02/22/2024  2:00 PM Dante Winton [place], LCSW [facility] None  03/07/2024 11:00 AM Bynum, Winton [place], LCSW [facility] None  04/04/2024 10:45 AM Carlan, Mitzie CROME, NP [facility] None    Manuelita Kallie, MD [facility] [address] [place], [place] [address] [phone] (office)

## 2024-01-20 NOTE — Patient Instructions (Signed)
 Powder to the belly button area. Keep the area dry between applications. Call with any changes or worsening of the area/ redness worsening.

## 2024-01-28 ENCOUNTER — Encounter: Payer: Self-pay | Admitting: General Surgery

## 2024-01-28 ENCOUNTER — Ambulatory Visit (INDEPENDENT_AMBULATORY_CARE_PROVIDER_SITE_OTHER): Admitting: General Surgery

## 2024-01-28 VITALS — BP 133/83 | HR 68 | Temp 98.2°F | Resp 14 | Ht 64.5 in | Wt 226.0 lb

## 2024-01-28 DIAGNOSIS — K429 Umbilical hernia without obstruction or gangrene: Secondary | ICD-10-CM

## 2024-01-28 NOTE — Progress Notes (Signed)
 [facility]  Doing well. No issues. Area of yeast resolved. No complaints.  BP 133/83   Pulse 68   Temp 98.2 F (36.8 C) (Oral)   Resp 14   Ht 5' 4.5 (1.638 m)   Wt 226 lb (102.5 kg)   LMP 10/20/2013   SpO2 92%   BMI 38.19 kg/m  Soft, nondistended, healed incision, resolved yeast infection, no drainage noted  Patient s/p umbilical hernia repair. Doing well. Yeast resolved.  No heavy lifting > 10 lbs, excessive bending, pushing, pulling, or squatting for 8 weeks after surgery, 02/23/24.  Call with issues. Nystatin  powder as needed   [name], MD [facility] [address] [place], [place] [address] [phone] (office)

## 2024-01-28 NOTE — Patient Instructions (Addendum)
 No heavy lifting > 10 lbs, excessive bending, pushing, pulling, or squatting for 8 weeks after surgery, 02/23/24.  Call with issues. Nystatin  powder as needed

## 2024-02-08 ENCOUNTER — Ambulatory Visit (HOSPITAL_COMMUNITY): Admitting: Psychiatry

## 2024-02-08 ENCOUNTER — Telehealth (HOSPITAL_COMMUNITY): Payer: Self-pay | Admitting: Psychiatry

## 2024-02-08 NOTE — Telephone Encounter (Signed)
 Therapist attempted to contact patient via text through caregility platform for scheduled appointment, no response.  Therapist called patient and received message indicating call could not be completed as dialed.  Therapist called the number again and received the same recording.

## 2024-02-22 ENCOUNTER — Ambulatory Visit (HOSPITAL_COMMUNITY): Admitting: Psychiatry

## 2024-02-22 ENCOUNTER — Telehealth (HOSPITAL_COMMUNITY): Payer: Self-pay | Admitting: Psychiatry

## 2024-02-22 NOTE — Telephone Encounter (Signed)
 Therapist attempted to contact patient via text through caregility platform for scheduled appointment, no response.  Therapist called patient and received voicemail recording indicating call could not be completed as dialed.  Therefore, therapist was unable to leave a message.

## 2024-02-23 ENCOUNTER — Encounter (HOSPITAL_COMMUNITY): Payer: Self-pay | Admitting: Psychiatry

## 2024-03-07 ENCOUNTER — Ambulatory Visit (HOSPITAL_COMMUNITY): Admitting: Psychiatry

## 2024-03-07 DIAGNOSIS — F331 Major depressive disorder, recurrent, moderate: Secondary | ICD-10-CM | POA: Diagnosis not present
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
## 2024-03-10 LAB — COLOGUARD: COLOGUARD: POSITIVE — AB

## 2024-03-31 ENCOUNTER — Ambulatory Visit (INDEPENDENT_AMBULATORY_CARE_PROVIDER_SITE_OTHER): Admitting: Gastroenterology

## 2024-04-04 ENCOUNTER — Ambulatory Visit (INDEPENDENT_AMBULATORY_CARE_PROVIDER_SITE_OTHER): Admitting: Gastroenterology

## 2024-04-22 ENCOUNTER — Ambulatory Visit (HOSPITAL_COMMUNITY): Admitting: Psychiatry

## 2024-04-25 ENCOUNTER — Encounter (INDEPENDENT_AMBULATORY_CARE_PROVIDER_SITE_OTHER): Payer: Self-pay | Admitting: Gastroenterology

## 2024-04-25 ENCOUNTER — Ambulatory Visit (INDEPENDENT_AMBULATORY_CARE_PROVIDER_SITE_OTHER): Admitting: Gastroenterology

## 2024-04-25 ENCOUNTER — Encounter (INDEPENDENT_AMBULATORY_CARE_PROVIDER_SITE_OTHER): Payer: Self-pay | Admitting: *Deleted

## 2024-04-25 VITALS — BP 120/67 | HR 65 | Temp 97.6°F | Ht 64.5 in | Wt 240.1 lb

## 2024-04-25 DIAGNOSIS — K7460 Unspecified cirrhosis of liver: Secondary | ICD-10-CM | POA: Diagnosis not present

## 2024-04-25 DIAGNOSIS — R195 Other fecal abnormalities: Secondary | ICD-10-CM | POA: Insufficient documentation

## 2024-04-25 NOTE — Progress Notes (Signed)
 [name] , M.D. Gastroenterology & Hepatology [facility] [address] [place], [place] [address] Primary Care Physician: Trudy Vaughn FALCON, MD [address] [place] [address]  Chief Complaint: Cirrhosis and positive Cologuard  History of Present Illness:  [name] is a 58 y.o. 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  No history of decompensating events (no jaundice, variceal bleeding, ascites, encephalopathy).    Patient was previously following with [facility] healthcare transplant pathology as well as with hematology and transferred care locally because of transportation ease.  She had a normal bone marrow biopsy in August 2022. 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    The patient denies having any nausea, vomiting, fever, chills, hematochezia, melena, hematemesis, abdominal distention, abdominal pain, diarrhea, jaundice, pruritus or weight loss.   Last EGD:09/2022 at [facility]   A medium-sized hiatal hernia was present. No varices.       No gross lesions were noted in the entire examined stomach. No varices       The examined duodenum was normal.    - Repeat upper endoscopy in 2 years for surveillance.    Last Colonoscopy: None   POSITIVE Cologuard test 02/2024   FHx: neg for any gastrointestinal/liver disease, no malignancies Social: neg smoking, alcohol or illicit drug use   Labs  from 10/2023 creatinine 0.58 AST 39 ALT 26 Normal T. bili  hemoglobin 12.2 platelet 85 INR 1.2 Ultrasound 07/2022   1. Cholelithiasis without secondary signs of acute cholecystitis.  2. Morphologic changes to the liver compatible with cirrhosis. No  focal hepatic lesion.  3. Common bile duct is dilated measuring 8.4 mm. Recommend  correlation with LFTs. If abnormal, recommend MRI/MRCP.  4. Calcified right adrenal mass, previously evaluated, compatible  with benign process given stability over time.   Past Medical History: Past Medical History:  Diagnosis Date   Anxiety    Arthritis    Asthma    Blind    Chronic respiratory failure (HCC)    COPD (chronic obstructive pulmonary disease) (HCC)    Depression    Diabetes mellitus without complication (HCC)    Glaucoma    Hyperlipidemia    Hypothyroidism    PONV (postoperative nausea and vomiting)    Sleep apnea    12   Thyroid disease     Past Surgical History: Past Surgical History:  Procedure Laterality Date   ANKLE SURGERY Right 2007   CATARACT EXTRACTION W/PHACO Left 02/27/2023   Procedure: CATARACT EXTRACTION PHACO AND INTRAOCULAR LENS PLACEMENT (IOC);  Surgeon: Harrie Agent, MD;  Location: [facility];  Service: Ophthalmology;  Laterality: Left;  CDE 9.85   ENDOMETRIAL ABLATION     ORIF ANKLE FRACTURE Left 12/01/2012   Procedure: OPEN REDUCTION INTERNAL FIXATION (ORIF) ANKLE FRACTURE;  Surgeon: Lemond Stable, MD;  Location: [facility];  Service: Orthopedics;  Laterality: Left;   TONSILLECTOMY     TUBAL LIGATION     TUBAL LIGATION     UMBILICAL HERNIA REPAIR N/A 12/29/2023   Procedure: REPAIR, HERNIA,  UMBILICAL, ADULT WITH MESH;  Surgeon: Kallie Manuelita BROCKS, MD;  Location: [facility];  Service: General;  Laterality: N/A;  W/ MESH    Family History: Family History  Problem Relation Age of Onset   Cancer Mother    Heart failure Father    Depression Daughter    Other Son        MVA    Social History: Social History    Tobacco Use  Smoking Status Every Day   Current packs/day: 0.50   Average packs/day: 0.5 packs/day for 39.0 years (19.5 ttl pk-yrs)   Types: Cigarettes  Smokeless Tobacco Never   Social History   Substance and Sexual Activity  Alcohol Use No   Social History   Substance and Sexual Activity  Drug Use No    Allergies: Allergies  Allergen Reactions   Zoloft  [Sertraline  Hcl] Diarrhea and Nausea And Vomiting   Metformin Diarrhea    Medications: Current Outpatient Medications  Medication Sig Dispense Refill   FLUoxetine  (PROZAC ) 20 MG capsule Take 1 capsule (20 mg total) by mouth daily. 30 capsule 3   latanoprost (XALATAN) 0.005 % ophthalmic solution Place 1 drop into both eyes at bedtime.      levothyroxine  (SYNTHROID ) 150 MCG tablet Take 100 mcg by mouth at bedtime. (Patient taking differently: Take 88 mcg by mouth at bedtime.)     mirtazapine  (REMERON ) 15 MG tablet Take 1 tablet (15 mg total) by mouth at bedtime. 30 tablet 3   No current facility-administered medications for this visit.    Review of Systems: GENERAL: negative for malaise, night sweats HEENT: No changes in hearing or vision, no nose bleeds or other nasal problems. NECK: Negative for lumps, goiter, pain and significant neck swelling RESPIRATORY: Negative for cough, wheezing CARDIOVASCULAR: Negative for chest pain, leg swelling, palpitations, orthopnea GI: SEE HPI MUSCULOSKELETAL: Negative for joint pain or swelling, back pain, and muscle pain. SKIN: Negative for lesions, rash HEMATOLOGY Negative for prolonged bleeding, bruising easily, and swollen nodes. ENDOCRINE: Negative for cold or heat intolerance, polyuria, polydipsia and goiter. NEURO: negative for tremor, gait imbalance, syncope and seizures. The remainder of the review of systems is noncontributory.   Physical Exam: BP 120/67   Pulse 65   Temp 97.6 F (36.4 C)   Ht 5' 4.5 (1.638 m)   Wt 240 lb 1.6 oz (108.9 kg)   LMP 10/20/2013    BMI 40.58 kg/m  GENERAL: The patient is AO x3, in no acute distress. HEENT: Head is normocephalic and atraumatic. EOMI are intact. Mouth is well hydrated and without lesions. NECK: Supple. No masses LUNGS: Clear to auscultation. No presence of rhonchi/wheezing/rales. Adequate chest expansion HEART: RRR, normal s1 and s2. ABDOMEN: Soft, nontender, no guarding, no peritoneal signs, and nondistended. BS +. No masses.  Imaging/Labs: as above     Latest Ref Rng & Units 11/23/2023    4:07 AM 09/21/2023    2:10 PM 04/22/2018    2:40 PM  CBC  WBC 4.0 - 10.5 K/uL 3.1  3.4  6.8   Hemoglobin 12.0 - 15.0 g/dL 87.7  86.2  86.4   Hematocrit 36.0 - 46.0 % 34.3  40.1  38.3   Platelets 150 - 400 K/uL 85  97  175    No results found for: IRON, TIBC, FERRITIN  I personally reviewed and interpreted the available labs, imaging and endoscopic files.  Ct abdomen and pelvis 10/2023  IMPRESSION: 1. Umbilical hernia containing fat and fluid. Hernia sac measures on the order of  4.5 x 2.9 x 7.5 cm. Underlying fascial defect measures approximately 1.4 x 1.6 cm. No evidence for bowel contained within the hernia. 2. 7.1 x 4.2 x 5.5 cm rim calcified structure in the right suprarenal space. This lesion is relatively low density but somewhat heterogeneous, potentially with an anterior soft tissue component. This may be a rim calcified adrenal cyst or chronic hematoma. Nonemergent outpatient abdominal MRI with and without contrast recommended to further evaluate and exclude underlying mass lesion. 3. Cirrhosis with portal venous hypertension. 4. Cholelithiasis. 5. Left colonic diverticulosis without diverticulitis. 6.  Aortic Atherosclerosis (ICD10-I70.0).   ADDENDUM: It has been brought to my attention that this patient has a previous chest CT from 10/20/2013 which included the upper abdomen. Although incompletely visualized on that exam, the rim calcified right adrenal mass is visible and  completely stable in size through the imaged portion of the lesion comparing back to that study from 10 years ago. As such, this lesion is considered benign and probably reflects sequelae of old hemorrhage/hematoma or infection of the adrenal gland. No further imaging follow-up indicated , Impression and Plan:  [name] is a 58 y.o. 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  Her major issue is rather morbid obesity.   Predicted Postoperative Outcomes by the VOCAL-Penn Score:     30-day mortality: 0.8%     90-day mortality: 2.1%     180-day mortality: 3.2%     90-day decompensation: 12.1%   #Eitology :    Previously followed up with [facility] transplant hepatology extensive workup and transferred care here at [facility] due to transportation ease   Likely metabolic dysfunction-associated steatotic liver disease (history of morbid obesity, diabetes mellitus, sleep apnea, and possible hypertension)    ANA 1:160, ASMA negative, IgG 1916, AAT 97, PI*MZ   PI*MZ with mildly low AAT level may have contributed to development of chronic liver disease but is not likely the primary etiology.    There was query for autoimmune hepatitis given elevated IgG given normal liver enzymes no biopsy was pursued and patient did not meet criteria for treatment.Will continue to evaluate with repeat labs    # Hepatic encephalopathy None - Avoid opiates or benzodiazepines   # Ascites - Low sodium diet None on exam and recent imaging   # Esophageal  varices - Last EGD 2024 without cirrhosis,    Given evidence of portal hypertension and thrombocytopenia patient likely has clinically significant portal hypertension and would benefit from nonselective beta-blocker.  Will obtain echocardiogram to ensure there is no heart failure and will start patient on carvedilol 3.125 twice daily to titrate to heart rate to 55-60 and SBP more than 90 mmHg   # HCC screening - Last abdominal imaging 10/2023 without any focal lesion -Repeat right upper quadrant sono plus AFP every 6   Recommendation    - Check MELD labs and AFP now , along with IgG and ANA, and will repeat hepatitis A,B and C serologies in future since patient got vaccinated against A and B - Schedule liver US  now - Reduce salt intake to <2 g per day -  Can take Tylenol  max of 2 g per day (650 mg q8h) for pain - Avoid NSAIDs for pain - Avoid eating raw oysters/shellfish - Protein shake (Ensure or Boost) every night before going to sleep - Obtain echocardiogram to ensure there is preserved heart function to start beta-blockers for esophageal varices prophylaxis   #Positive cologuard   Patient does not have any high risk factors for colorectal cancer malignancy.  She has been asymptomatic. Discussed cologuard  in detail, specifically what it means when the test is positive or negative.  Discussed that there is a possibility that even when the test is positive there may not be a polyp found on colonoscopy. More than 50% of the office visit was dedicated to discussing the procedure, including the day of and risks involved. Patient understands what the procedure involves including the benefits and any risks. Patient understands alternatives to the proposed procedure. Risks including (but not limited to) bleeding, tearing of the lining (perforation), rupture of adjacent organs, problems with heart and lung function, infection, and medication reactions. A small percentage of complications may require  surgery, hospitalization, repeat endoscopic procedure, and/or transfusion. A small percentage of polyps and other tumors may not be seen.  - Schedule colonoscopy     All questions were answered.      Leroy Pettway Faizan Nayelli Inglis, MD Gastroenterology and Hepatology [facility]   This chart has been completed using [facility] Dictation software, and while attempts have been made to ensure accuracy , certain words and phrases may not be transcribed as intended

## 2024-04-25 NOTE — Patient Instructions (Signed)
 It was very nice to meet you today, as dicussed with will plan for the following :  1) colonoscopy  2) Echocardiogram and Ultrasound   3) Labs

## 2024-04-26 LAB — HEPATITIS A ANTIBODY, TOTAL: hep A Total Ab: POSITIVE — AB

## 2024-04-26 LAB — COMPREHENSIVE METABOLIC PANEL WITH GFR
ALT: 24 IU/L (ref 0–32)
AST: 41 IU/L — ABNORMAL HIGH (ref 0–40)
Albumin: 3.1 g/dL — ABNORMAL LOW (ref 3.8–4.9)
Alkaline Phosphatase: 110 IU/L (ref 49–135)
BUN/Creatinine Ratio: 16 (ref 12–28)
BUN: 10 mg/dL (ref 8–27)
Bilirubin Total: 1 mg/dL (ref 0.0–1.2)
CO2: 20 mmol/L (ref 20–29)
Calcium: 8.3 mg/dL — ABNORMAL LOW (ref 8.7–10.3)
Chloride: 107 mmol/L — ABNORMAL HIGH (ref 96–106)
Creatinine, Ser: 0.61 mg/dL (ref 0.57–1.00)
Globulin, Total: 3 g/dL (ref 1.5–4.5)
Glucose: 81 mg/dL (ref 70–99)
Potassium: 4 mmol/L (ref 3.5–5.2)
Sodium: 138 mmol/L (ref 134–144)
Total Protein: 6.1 g/dL (ref 6.0–8.5)
eGFR: 102 mL/min/1.73 (ref >59)

## 2024-04-26 LAB — PROTIME-INR
INR: 1.1 (ref 0.9–1.2)
Prothrombin Time: 11.8 s (ref 9.1–12.0)

## 2024-04-26 LAB — AFP TUMOR MARKER: AFP, Serum, Tumor Marker: 3.1 ng/mL (ref 0.0–9.2)

## 2024-04-26 LAB — HEPATITIS B SURFACE ANTIBODY,QUALITATIVE: Hep B Surface Ab, Qual: REACTIVE

## 2024-04-26 LAB — IGG: IgG (Immunoglobin G), Serum: 1766 mg/dL — ABNORMAL HIGH (ref 586–1602)

## 2024-04-26 LAB — HEPATITIS B SURFACE ANTIGEN: Hepatitis B Surface Ag: NEGATIVE

## 2024-04-26 LAB — ANA: Anti Nuclear Antibody (ANA): NEGATIVE

## 2024-04-27 ENCOUNTER — Telehealth: Payer: Self-pay | Admitting: *Deleted

## 2024-04-27 NOTE — Telephone Encounter (Signed)
 LMOVM to call back  1) US  scheduled for 11/6, arrival 9:45am, npo midnight prior 2) Cardiology will call to schedule her echo 3)schedule for TCS with Dr. Cinderella, any room. Per Dr. Cinderella does not need echo done prior, go ahead and schedule.

## 2024-04-28 ENCOUNTER — Other Ambulatory Visit (HOSPITAL_COMMUNITY): Payer: Self-pay | Admitting: Internal Medicine

## 2024-04-28 DIAGNOSIS — Z1231 Encounter for screening mammogram for malignant neoplasm of breast: Secondary | ICD-10-CM

## 2024-04-29 ENCOUNTER — Ambulatory Visit (INDEPENDENT_AMBULATORY_CARE_PROVIDER_SITE_OTHER): Payer: Self-pay | Admitting: Gastroenterology

## 2024-04-29 DIAGNOSIS — K7460 Unspecified cirrhosis of liver: Secondary | ICD-10-CM

## 2024-04-29 NOTE — Progress Notes (Signed)
 Hi Tanya ,  Can you please call the patient and tell the patient the lab work shows slightly elevated liver enzyme and one of the blood work which can be seen in patients with autoimmune hepatitis is slightly elevated  I do recommend continue following with us  in the clinic and we will discuss further management  Thanks,  Byrd Terrero Faizan Jian Hodgman, MD Gastroenterology and Hepatology [facility] Gastroenterology  ==============  Hi Devere,  Can you please schedule a follow up appointment for this patient in 4-6 weeks  with me ?  ======================  Labs AST 41 (elevated) ALT 24 alk phos 110 T. bili 1.0  Negative ANA  IgG elevated at 1766  Hep A immune Hep B surface antibody positive, surface antigen negative  AFP 3.1  INR 1.1  Awaiting echo and ultrasound abdomen  May pursue liver biopsy in future as liver enzyme is not elevated and if patient has cirrhosis that would be a criteria for treatment itself

## 2024-05-03 NOTE — Telephone Encounter (Signed)
 LMOVM to return call  Pt left vm stating she needed to be scheduled for procedure and that her ECHO is scheduled for tomorrow.

## 2024-05-04 ENCOUNTER — Ambulatory Visit (HOSPITAL_COMMUNITY)
Admission: RE | Admit: 2024-05-04 | Discharge: 2024-05-04 | Disposition: A | Discharge status: Home / Self Care | Source: Ambulatory Visit | Attending: Gastroenterology | Admitting: Gastroenterology

## 2024-05-04 DIAGNOSIS — J449 Chronic obstructive pulmonary disease, unspecified: Secondary | ICD-10-CM | POA: Diagnosis not present

## 2024-05-04 DIAGNOSIS — Z720 Tobacco use: Secondary | ICD-10-CM | POA: Insufficient documentation

## 2024-05-04 DIAGNOSIS — E119 Type 2 diabetes mellitus without complications: Secondary | ICD-10-CM | POA: Diagnosis not present

## 2024-05-04 DIAGNOSIS — E785 Hyperlipidemia, unspecified: Secondary | ICD-10-CM | POA: Insufficient documentation

## 2024-05-04 DIAGNOSIS — K7460 Unspecified cirrhosis of liver: Secondary | ICD-10-CM | POA: Diagnosis not present

## 2024-05-04 DIAGNOSIS — I10 Essential (primary) hypertension: Secondary | ICD-10-CM | POA: Insufficient documentation

## 2024-05-04 LAB — ECHOCARDIOGRAM COMPLETE
Area-P 1/2: 2.95 cm2
S' Lateral: 2.5 cm

## 2024-05-04 NOTE — Progress Notes (Signed)
*  PRELIMINARY RESULTS* Echocardiogram 2D Echocardiogram has been performed.  [name] 05/04/2024, 1:54 PM

## 2024-05-05 ENCOUNTER — Ambulatory Visit (HOSPITAL_COMMUNITY)

## 2024-05-05 MED ORDER — PEG 3350-KCL-NA BICARB-NACL 420 G PO SOLR: 4000.0000 mL | Freq: Once | ORAL | 0 refills | Status: AC

## 2024-05-05 MED ORDER — CARVEDILOL 3.125 MG PO TABS: 3.1250 mg | ORAL_TABLET | Freq: Two times a day (BID) | ORAL | 3 refills | Status: DC

## 2024-05-05 NOTE — Telephone Encounter (Signed)
 LMOVM to call back to schedule procedure with Dr. Cinderella

## 2024-05-05 NOTE — Progress Notes (Signed)
 Can you please send ECHO report to the PCP for continued management

## 2024-05-05 NOTE — Telephone Encounter (Addendum)
 Spoke with pt. Scheduled for 12/19 for colonoscopy. Aware will mail instructions and send rx for prep to pharmacy.  Pt missed her us  appt. She advised me to leave on her vm once rescheduled. Called pt back and left appt on her VM and mailed letter.

## 2024-05-05 NOTE — Addendum Note (Signed)
 Addended by: JEANELL GRAEME RAMAN on: 05/05/2024 02:45 PM   Modules accepted: Orders

## 2024-05-05 NOTE — Progress Notes (Signed)
 Hi Mindy can we schedule her Colonoscopy for positive Cologuard  now, ECHO is fine  ===========  Hi Tanya ,  Can you please call the patient and tell the Echo shows normal pumping of the heart, which is good news  . I recommend starting carvedilol 3.125 mg twice daily for prophylaxis against esophageal varices and portal hypertension as she has cirrhosis . Also recommend colonoscopy as previously discussed in clinic    Thanks,  Vanessa Kampf Faizan Jakel Alphin, MD Gastroenterology and Hepatology [facility] Gastroenterology ===========    1. Left ventricular ejection fraction, by estimation, is 65 to 70%. Left ventricular ejection fraction by 3D volume is 66 %. The left ventricle has normal function. The left ventricle has no regional wall motion abnormalities. Left ventricular diastolic  parameters are indeterminate.  2. Right ventricular systolic function is normal. The right ventricular size is normal. Tricuspid regurgitation signal is inadequate for assessing PA pressure.  3. Left atrial size was mildly dilated.  4. The mitral valve is degenerative. Trivial mitral valve regurgitation. Moderate mitral annular calcification.  5. The aortic valve is tricuspid. Aortic valve regurgitation is not visualized. Aortic valve sclerosis/calcification is present, without any evidence of aortic stenosis.  6. The inferior vena cava is normal in size with greater than 50% respiratory variability, suggesting right atrial pressure of 3 mmHg.

## 2024-05-06 ENCOUNTER — Ambulatory Visit (HOSPITAL_COMMUNITY): Admitting: Psychiatry

## 2024-05-06 DIAGNOSIS — F4310 Post-traumatic stress disorder, unspecified: Secondary | ICD-10-CM | POA: Diagnosis not present

## 2024-05-06 DIAGNOSIS — F331 Major depressive disorder, recurrent, moderate: Secondary | ICD-10-CM | POA: Diagnosis not present

## 2024-05-06 DIAGNOSIS — F411 Generalized anxiety disorder: Secondary | ICD-10-CM
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
## 2024-05-09 NOTE — Progress Notes (Signed)
report faxed to PCP  

## 2024-05-11 ENCOUNTER — Other Ambulatory Visit (HOSPITAL_COMMUNITY): Payer: Self-pay | Admitting: Internal Medicine

## 2024-05-11 ENCOUNTER — Ambulatory Visit
Admission: RE | Admit: 2024-05-11 | Discharge: 2024-05-11 | Disposition: A | Discharge status: Home / Self Care | Payer: Self-pay | Source: Ambulatory Visit | Attending: Internal Medicine | Admitting: Internal Medicine

## 2024-05-11 ENCOUNTER — Ambulatory Visit (HOSPITAL_COMMUNITY)
Admission: RE | Admit: 2024-05-11 | Discharge: 2024-05-11 | Disposition: A | Discharge status: Home / Self Care | Source: Ambulatory Visit | Attending: Internal Medicine | Admitting: Internal Medicine

## 2024-05-11 DIAGNOSIS — Z1231 Encounter for screening mammogram for malignant neoplasm of breast: Secondary | ICD-10-CM

## 2024-05-18 ENCOUNTER — Ambulatory Visit (HOSPITAL_COMMUNITY): Attending: Gastroenterology

## 2024-05-31 ENCOUNTER — Telehealth (INDEPENDENT_AMBULATORY_CARE_PROVIDER_SITE_OTHER): Payer: Self-pay

## 2024-05-31 ENCOUNTER — Encounter (INDEPENDENT_AMBULATORY_CARE_PROVIDER_SITE_OTHER): Payer: Self-pay | Admitting: Gastroenterology

## 2024-05-31 ENCOUNTER — Ambulatory Visit (INDEPENDENT_AMBULATORY_CARE_PROVIDER_SITE_OTHER): Admitting: Gastroenterology

## 2024-05-31 VITALS — BP 131/81 | HR 65 | Temp 97.6°F | Ht 64.5 in | Wt 253.2 lb

## 2024-05-31 DIAGNOSIS — K7460 Unspecified cirrhosis of liver: Secondary | ICD-10-CM

## 2024-05-31 DIAGNOSIS — K766 Portal hypertension: Secondary | ICD-10-CM | POA: Diagnosis not present

## 2024-05-31 DIAGNOSIS — R195 Other fecal abnormalities: Secondary | ICD-10-CM | POA: Diagnosis not present

## 2024-05-31 NOTE — H&P (View-Only) (Signed)
 Kaye Luoma Faizan Treyon Wymore , M.D. Gastroenterology & Hepatology [facility] Gastroenterology [address] [place], [place] [address] Primary Care Physician: Trudy Vaughn FALCON, MD [address] [place] [address]  Chief Complaint: Cirrhosis and positive Cologuard  History of Present Illness:  [name] is a 58 y.o. 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  No active complaints at this time.  Patient was started on carvedilol  3.125 mg twice daily  No history of decompensating events (no jaundice, variceal bleeding, ascites, encephalopathy).    Patient was previously following with [facility] transplant pathology as well as with hematology and transferred care locally because of transportation ease.  She had a normal bone marrow biopsy in August 2022. 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    The patient denies having any nausea, vomiting, fever, chills, hematochezia, melena, hematemesis, abdominal distention, abdominal pain, diarrhea, jaundice, pruritus or weight loss.   Last EGD:09/2022 at [facility]   A medium-sized hiatal hernia was present. No varices.       No gross lesions were noted in the entire examined stomach. No varices       The examined duodenum was normal.    - Repeat upper endoscopy in 2 years for surveillance.    Last Colonoscopy: None   POSITIVE Cologuard test  02/2024   FHx: neg for any gastrointestinal/liver disease, no malignancies Social: neg smoking, alcohol or illicit drug use   Labs from 10/2023 creatinine 0.58 AST 39 ALT 26 Normal T. bili  hemoglobin 12.2 platelet 85 INR 1.2 Ultrasound 07/2022   1. Cholelithiasis without secondary signs of acute cholecystitis.  2. Morphologic changes to the liver compatible with cirrhosis. No  focal hepatic lesion.  3. Common bile duct is dilated measuring 8.4 mm. Recommend  correlation with LFTs. If abnormal, recommend MRI/MRCP.  4. Calcified right adrenal mass, previously evaluated, compatible  with benign process given stability over time.   Past Medical History: Past Medical History:  Diagnosis Date   Anxiety    Arthritis    Asthma    Blind    Chronic respiratory failure (HCC)    COPD (chronic obstructive pulmonary disease) (HCC)    Depression    Diabetes mellitus without complication (HCC)    Glaucoma    Hyperlipidemia    Hypothyroidism    PONV (postoperative nausea and vomiting)    Sleep apnea    12   Thyroid disease     Past Surgical History: Past Surgical History:  Procedure Laterality Date   ANKLE SURGERY Right 2007   CATARACT EXTRACTION W/PHACO Left 02/27/2023   Procedure: CATARACT EXTRACTION PHACO AND INTRAOCULAR LENS PLACEMENT (IOC);  Surgeon: Harrie Agent, MD;  Location: [facility];  Service: Ophthalmology;  Laterality: Left;  CDE 9.85   ENDOMETRIAL ABLATION     ORIF ANKLE FRACTURE Left 12/01/2012   Procedure: OPEN REDUCTION INTERNAL FIXATION (ORIF) ANKLE FRACTURE;  Surgeon: Lemond Stable, MD;  Location: [facility];  Service: Orthopedics;  Laterality: Left;   TONSILLECTOMY  TUBAL LIGATION     TUBAL LIGATION     UMBILICAL HERNIA REPAIR N/A 12/29/2023   Procedure: REPAIR, HERNIA, UMBILICAL, ADULT WITH MESH;  Surgeon: Kallie Manuelita BROCKS, MD;  Location: [facility];  Service: General;  Laterality: N/A;  W/ MESH    Family History: Family History  Problem Relation Age of Onset    Cancer Mother    Heart failure Father    Depression Daughter    Other Son        MVA    Social History: Social History   Tobacco Use  Smoking Status Every Day   Current packs/day: 0.50   Average packs/day: 0.5 packs/day for 39.0 years (19.5 ttl pk-yrs)   Types: Cigarettes  Smokeless Tobacco Never   Social History   Substance and Sexual Activity  Alcohol Use No   Social History   Substance and Sexual Activity  Drug Use No    Allergies: Allergies  Allergen Reactions   Zoloft  [Sertraline  Hcl] Diarrhea and Nausea And Vomiting   Metformin Diarrhea    Medications: Current Outpatient Medications  Medication Sig Dispense Refill   carvedilol  (COREG ) 3.125 MG tablet Take 1 tablet (3.125 mg total) by mouth 2 (two) times daily with a meal. 60 tablet 3   FLUoxetine  (PROZAC ) 20 MG capsule Take 1 capsule (20 mg total) by mouth daily. 30 capsule 3   latanoprost (XALATAN) 0.005 % ophthalmic solution Place 1 drop into both eyes at bedtime.      levothyroxine  (SYNTHROID ) 150 MCG tablet Take 100 mcg by mouth at bedtime. (Patient taking differently: Take 88 mcg by mouth at bedtime.)     mirtazapine  (REMERON ) 15 MG tablet Take 1 tablet (15 mg total) by mouth at bedtime. 30 tablet 3   No current facility-administered medications for this visit.    Review of Systems: GENERAL: negative for malaise, night sweats HEENT: No changes in hearing or vision, no nose bleeds or other nasal problems. NECK: Negative for lumps, goiter, pain and significant neck swelling RESPIRATORY: Negative for cough, wheezing CARDIOVASCULAR: Negative for chest pain, leg swelling, palpitations, orthopnea GI: SEE HPI MUSCULOSKELETAL: Negative for joint pain or swelling, back pain, and muscle pain. SKIN: Negative for lesions, rash HEMATOLOGY Negative for prolonged bleeding, bruising easily, and swollen nodes. ENDOCRINE: Negative for cold or heat intolerance, polyuria, polydipsia and goiter. NEURO: negative for  tremor, gait imbalance, syncope and seizures. The remainder of the review of systems is noncontributory.   Physical Exam: BP 131/81   Pulse 65   Temp 97.6 F (36.4 C)   Ht 5' 4.5 (1.638 m)   Wt 253 lb 3.2 oz (114.9 kg)   LMP 10/20/2013   BMI 42.79 kg/m  GENERAL: The patient is AO x3, in no acute distress. HEENT: Head is normocephalic and atraumatic. EOMI are intact. Mouth is well hydrated and without lesions. NECK: Supple. No masses LUNGS: Clear to auscultation. No presence of rhonchi/wheezing/rales. Adequate chest expansion HEART: RRR, normal s1 and s2. ABDOMEN: Soft, nontender, no guarding, no peritoneal signs, and nondistended. BS +. No masses.  Imaging/Labs: as above     Latest Ref Rng & Units 11/23/2023    4:07 AM 09/21/2023    2:10 PM 04/22/2018    2:40 PM  CBC  WBC 4.0 - 10.5 K/uL 3.1  3.4  6.8   Hemoglobin 12.0 - 15.0 g/dL 87.7  86.2  86.4   Hematocrit 36.0 - 46.0 % 34.3  40.1  38.3   Platelets 150 - 400 K/uL 85  97  175    No results found for: IRON, TIBC, FERRITIN  I personally reviewed and interpreted the available labs, imaging and endoscopic files.  Ct abdomen and pelvis 10/2023  IMPRESSION: 1. Umbilical hernia containing fat and fluid. Hernia sac measures on the order of 4.5 x 2.9 x 7.5 cm. Underlying fascial defect measures approximately 1.4 x 1.6 cm. No evidence for bowel contained within the hernia. 2. 7.1 x 4.2 x 5.5 cm rim calcified structure in the right suprarenal space. This lesion is relatively low density but somewhat heterogeneous, potentially with an anterior soft tissue component. This may be a rim calcified adrenal cyst or chronic hematoma. Nonemergent outpatient abdominal MRI with and without contrast recommended to further evaluate and exclude underlying mass lesion. 3. Cirrhosis with portal venous hypertension. 4. Cholelithiasis. 5. Left colonic diverticulosis without diverticulitis. 6.  Aortic Atherosclerosis  (ICD10-I70.0).   ADDENDUM: It has been brought to my attention that this patient has a previous chest CT from 10/20/2013 which included the upper abdomen. Although incompletely visualized on that exam, the rim calcified right adrenal mass is visible and completely stable in size through the imaged portion of the lesion comparing back to that study from 10 years ago. As such, this lesion is considered benign and probably reflects sequelae of old hemorrhage/hematoma or infection of the adrenal gland. No further imaging follow-up indicated  Workup for cirrhosis  =========   Labs AST 41 (elevated) ALT 24 alk phos 110 T. bili 1.0   Negative ANA   IgG elevated at 1766   Hep A immune Hep B surface antibody positive, surface antigen negative   Hep C negative ( 1 year ago at [facility])  AFP 3.1   INR 1.1  Impression and Plan:  [name] is a 58 y.o. 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 (history of morbid obesity, diabetes mellitus, sleep apnea, and possible hypertension) who presents for evaluation of known cirrhosis and positive Cologuard   #Compensated Cirrhosis  MELD 3.0: 9 at 04/25/2024 11:15 AM MELD-Na: 7 at 04/25/2024 11:15 AM Calculated from: Serum Creatinine: 0.61 mg/dL (Using min of 1 mg/dL) at 89/72/7974 88:84 AM Serum Sodium: 138 mmol/L (Using max of 137 mmol/L) at 04/25/2024 11:15 AM Total Bilirubin: 1 mg/dL at 89/72/7974 88:84 AM Serum Albumin: 3.1 g/dL at 89/72/7974 88:84 AM INR(ratio): 1.1 at 04/25/2024 11:15 AM Age at listing (hypothetical): 58 years Sex: Female at 04/25/2024 11:15 AM   Patient appears to have compensated cirrhosis without any history of previous decompensation and appears to be low risk (30-day mortality: 0.8% as per VOCAL Penn Score ) for low risk procedures.  Her major issue is rather morbid obesity.   Predicted Postoperative Outcomes by the VOCAL-Penn Score:     30-day  mortality: 0.8%     90-day mortality: 2.1%     180-day mortality: 3.2%     90-day decompensation: 12.1%   #Eitology :    Previously followed up with [facility] transplant hepatology extensive workup and transferred care here at [facility] due to transportation ease   Likely metabolic dysfunction-associated steatotic liver disease (history of morbid obesity, diabetes mellitus, sleep apnea, and possible hypertension)    ANA 1:160, ASMA negative, IgG 1916, AAT 97, PI*MZ   PI*MZ with mildly low AAT level may have contributed to development of chronic liver disease but is not likely the primary etiology.    There was query for autoimmune hepatitis given elevated IgG given normal liver enzymes no biopsy was pursued and  patient did not meet criteria for treatment.  Most recent labs with continued elevated IgG and elevated liver enzymes   # Hepatic encephalopathy None - Avoid opiates or benzodiazepines   # Ascites - Low sodium diet None on exam and recent imaging   # Esophageal varices - Last EGD 2024 without cirrhosis,    Given evidence of portal hypertension and thrombocytopenia patient likely has clinically significant portal hypertension and would benefit from nonselective beta-blocker.  Will obtain echocardiogram to ensure there is no heart failure and will start patient on carvedilol  3.125 twice daily to titrate to heart rate to 55-60 and SBP more than 90 mmHg   # HCC screening - Last abdominal imaging 10/2023 without any focal lesion -Repeat right upper quadrant sono plus AFP every 6 months .    Recommendation    - will repeat hepatitis A,B serologies in future since patient got vaccinated against A and B along with MELD labs  - Schedule liver US  now - Reduce salt intake to <2 g per day - Can take Tylenol  max of 2 g per day (650 mg q8h) for pain - Avoid NSAIDs for pain - Avoid eating raw oysters/shellfish - Protein shake (Ensure or Boost) every night before going to  sleep -carvedilol  3.125 mg twice daily for prophylaxis against esophageal varices and portal hypertension as she has cirrhosis .    -Will pursue liver biopsy for evaluation of autoimmune hepatitis given elevation in IgG and elevated liver enzymes if patient has AIP, regardless of degree of elevation liver enzymes with having cirrhosis that would be a criteria for treatment itself   #Positive cologuard   Patient is scheduled for colonoscopy 06/17/2024   All questions were answered.      Christophr Calix Faizan Amaris Delafuente, MD Gastroenterology and Hepatology [facility] Gastroenterology   This chart has been completed using [facility] Dictation software, and while attempts have been made to ensure accuracy , certain words and phrases may not be transcribed as intended

## 2024-05-31 NOTE — Addendum Note (Signed)
 Addended by: DALLIE LIONEL RAMAN on: 05/31/2024 04:07 PM   Modules accepted: Orders

## 2024-05-31 NOTE — Telephone Encounter (Addendum)
 Patient scheduled for ultrasound on 06/06/2024 at 12:30. Patient aware and verbalized understanding. Patient also aware central scheduling will call to schedule her liver biopsy.

## 2024-05-31 NOTE — Progress Notes (Signed)
 Kaye Luoma Faizan Treyon Wymore , M.D. Gastroenterology & Hepatology [facility] Gastroenterology [address] [place], [place] [address] Primary Care Physician: Trudy Vaughn FALCON, MD [address] [place] [address]  Chief Complaint: Cirrhosis and positive Cologuard  History of Present Illness:  [name] is a 58 y.o. 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  No active complaints at this time.  Patient was started on carvedilol  3.125 mg twice daily  No history of decompensating events (no jaundice, variceal bleeding, ascites, encephalopathy).    Patient was previously following with [facility] transplant pathology as well as with hematology and transferred care locally because of transportation ease.  She had a normal bone marrow biopsy in August 2022. 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    The patient denies having any nausea, vomiting, fever, chills, hematochezia, melena, hematemesis, abdominal distention, abdominal pain, diarrhea, jaundice, pruritus or weight loss.   Last EGD:09/2022 at [facility]   A medium-sized hiatal hernia was present. No varices.       No gross lesions were noted in the entire examined stomach. No varices       The examined duodenum was normal.    - Repeat upper endoscopy in 2 years for surveillance.    Last Colonoscopy: None   POSITIVE Cologuard test  02/2024   FHx: neg for any gastrointestinal/liver disease, no malignancies Social: neg smoking, alcohol or illicit drug use   Labs from 10/2023 creatinine 0.58 AST 39 ALT 26 Normal T. bili  hemoglobin 12.2 platelet 85 INR 1.2 Ultrasound 07/2022   1. Cholelithiasis without secondary signs of acute cholecystitis.  2. Morphologic changes to the liver compatible with cirrhosis. No  focal hepatic lesion.  3. Common bile duct is dilated measuring 8.4 mm. Recommend  correlation with LFTs. If abnormal, recommend MRI/MRCP.  4. Calcified right adrenal mass, previously evaluated, compatible  with benign process given stability over time.   Past Medical History: Past Medical History:  Diagnosis Date   Anxiety    Arthritis    Asthma    Blind    Chronic respiratory failure (HCC)    COPD (chronic obstructive pulmonary disease) (HCC)    Depression    Diabetes mellitus without complication (HCC)    Glaucoma    Hyperlipidemia    Hypothyroidism    PONV (postoperative nausea and vomiting)    Sleep apnea    12   Thyroid disease     Past Surgical History: Past Surgical History:  Procedure Laterality Date   ANKLE SURGERY Right 2007   CATARACT EXTRACTION W/PHACO Left 02/27/2023   Procedure: CATARACT EXTRACTION PHACO AND INTRAOCULAR LENS PLACEMENT (IOC);  Surgeon: Harrie Agent, MD;  Location: [facility];  Service: Ophthalmology;  Laterality: Left;  CDE 9.85   ENDOMETRIAL ABLATION     ORIF ANKLE FRACTURE Left 12/01/2012   Procedure: OPEN REDUCTION INTERNAL FIXATION (ORIF) ANKLE FRACTURE;  Surgeon: Lemond Stable, MD;  Location: [facility];  Service: Orthopedics;  Laterality: Left;   TONSILLECTOMY  TUBAL LIGATION     TUBAL LIGATION     UMBILICAL HERNIA REPAIR N/A 12/29/2023   Procedure: REPAIR, HERNIA, UMBILICAL, ADULT WITH MESH;  Surgeon: Kallie Manuelita BROCKS, MD;  Location: [facility];  Service: General;  Laterality: N/A;  W/ MESH    Family History: Family History  Problem Relation Age of Onset    Cancer Mother    Heart failure Father    Depression Daughter    Other Son        MVA    Social History: Social History   Tobacco Use  Smoking Status Every Day   Current packs/day: 0.50   Average packs/day: 0.5 packs/day for 39.0 years (19.5 ttl pk-yrs)   Types: Cigarettes  Smokeless Tobacco Never   Social History   Substance and Sexual Activity  Alcohol Use No   Social History   Substance and Sexual Activity  Drug Use No    Allergies: Allergies  Allergen Reactions   Zoloft  [Sertraline  Hcl] Diarrhea and Nausea And Vomiting   Metformin Diarrhea    Medications: Current Outpatient Medications  Medication Sig Dispense Refill   carvedilol  (COREG ) 3.125 MG tablet Take 1 tablet (3.125 mg total) by mouth 2 (two) times daily with a meal. 60 tablet 3   FLUoxetine  (PROZAC ) 20 MG capsule Take 1 capsule (20 mg total) by mouth daily. 30 capsule 3   latanoprost (XALATAN) 0.005 % ophthalmic solution Place 1 drop into both eyes at bedtime.      levothyroxine  (SYNTHROID ) 150 MCG tablet Take 100 mcg by mouth at bedtime. (Patient taking differently: Take 88 mcg by mouth at bedtime.)     mirtazapine  (REMERON ) 15 MG tablet Take 1 tablet (15 mg total) by mouth at bedtime. 30 tablet 3   No current facility-administered medications for this visit.    Review of Systems: GENERAL: negative for malaise, night sweats HEENT: No changes in hearing or vision, no nose bleeds or other nasal problems. NECK: Negative for lumps, goiter, pain and significant neck swelling RESPIRATORY: Negative for cough, wheezing CARDIOVASCULAR: Negative for chest pain, leg swelling, palpitations, orthopnea GI: SEE HPI MUSCULOSKELETAL: Negative for joint pain or swelling, back pain, and muscle pain. SKIN: Negative for lesions, rash HEMATOLOGY Negative for prolonged bleeding, bruising easily, and swollen nodes. ENDOCRINE: Negative for cold or heat intolerance, polyuria, polydipsia and goiter. NEURO: negative for  tremor, gait imbalance, syncope and seizures. The remainder of the review of systems is noncontributory.   Physical Exam: BP 131/81   Pulse 65   Temp 97.6 F (36.4 C)   Ht 5' 4.5 (1.638 m)   Wt 253 lb 3.2 oz (114.9 kg)   LMP 10/20/2013   BMI 42.79 kg/m  GENERAL: The patient is AO x3, in no acute distress. HEENT: Head is normocephalic and atraumatic. EOMI are intact. Mouth is well hydrated and without lesions. NECK: Supple. No masses LUNGS: Clear to auscultation. No presence of rhonchi/wheezing/rales. Adequate chest expansion HEART: RRR, normal s1 and s2. ABDOMEN: Soft, nontender, no guarding, no peritoneal signs, and nondistended. BS +. No masses.  Imaging/Labs: as above     Latest Ref Rng & Units 11/23/2023    4:07 AM 09/21/2023    2:10 PM 04/22/2018    2:40 PM  CBC  WBC 4.0 - 10.5 K/uL 3.1  3.4  6.8   Hemoglobin 12.0 - 15.0 g/dL 87.7  86.2  86.4   Hematocrit 36.0 - 46.0 % 34.3  40.1  38.3   Platelets 150 - 400 K/uL 85  97  175    No results found for: IRON, TIBC, FERRITIN  I personally reviewed and interpreted the available labs, imaging and endoscopic files.  Ct abdomen and pelvis 10/2023  IMPRESSION: 1. Umbilical hernia containing fat and fluid. Hernia sac measures on the order of 4.5 x 2.9 x 7.5 cm. Underlying fascial defect measures approximately 1.4 x 1.6 cm. No evidence for bowel contained within the hernia. 2. 7.1 x 4.2 x 5.5 cm rim calcified structure in the right suprarenal space. This lesion is relatively low density but somewhat heterogeneous, potentially with an anterior soft tissue component. This may be a rim calcified adrenal cyst or chronic hematoma. Nonemergent outpatient abdominal MRI with and without contrast recommended to further evaluate and exclude underlying mass lesion. 3. Cirrhosis with portal venous hypertension. 4. Cholelithiasis. 5. Left colonic diverticulosis without diverticulitis. 6.  Aortic Atherosclerosis  (ICD10-I70.0).   ADDENDUM: It has been brought to my attention that this patient has a previous chest CT from 10/20/2013 which included the upper abdomen. Although incompletely visualized on that exam, the rim calcified right adrenal mass is visible and completely stable in size through the imaged portion of the lesion comparing back to that study from 10 years ago. As such, this lesion is considered benign and probably reflects sequelae of old hemorrhage/hematoma or infection of the adrenal gland. No further imaging follow-up indicated  Workup for cirrhosis  =========   Labs AST 41 (elevated) ALT 24 alk phos 110 T. bili 1.0   Negative ANA   IgG elevated at 1766   Hep A immune Hep B surface antibody positive, surface antigen negative   Hep C negative ( 1 year ago at [facility])  AFP 3.1   INR 1.1  Impression and Plan:  [name] is a 58 y.o. 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 (history of morbid obesity, diabetes mellitus, sleep apnea, and possible hypertension) who presents for evaluation of known cirrhosis and positive Cologuard   #Compensated Cirrhosis  MELD 3.0: 9 at 04/25/2024 11:15 AM MELD-Na: 7 at 04/25/2024 11:15 AM Calculated from: Serum Creatinine: 0.61 mg/dL (Using min of 1 mg/dL) at 89/72/7974 88:84 AM Serum Sodium: 138 mmol/L (Using max of 137 mmol/L) at 04/25/2024 11:15 AM Total Bilirubin: 1 mg/dL at 89/72/7974 88:84 AM Serum Albumin: 3.1 g/dL at 89/72/7974 88:84 AM INR(ratio): 1.1 at 04/25/2024 11:15 AM Age at listing (hypothetical): 58 years Sex: Female at 04/25/2024 11:15 AM   Patient appears to have compensated cirrhosis without any history of previous decompensation and appears to be low risk (30-day mortality: 0.8% as per VOCAL Penn Score ) for low risk procedures.  Her major issue is rather morbid obesity.   Predicted Postoperative Outcomes by the VOCAL-Penn Score:     30-day  mortality: 0.8%     90-day mortality: 2.1%     180-day mortality: 3.2%     90-day decompensation: 12.1%   #Eitology :    Previously followed up with [facility] transplant hepatology extensive workup and transferred care here at [facility] due to transportation ease   Likely metabolic dysfunction-associated steatotic liver disease (history of morbid obesity, diabetes mellitus, sleep apnea, and possible hypertension)    ANA 1:160, ASMA negative, IgG 1916, AAT 97, PI*MZ   PI*MZ with mildly low AAT level may have contributed to development of chronic liver disease but is not likely the primary etiology.    There was query for autoimmune hepatitis given elevated IgG given normal liver enzymes no biopsy was pursued and  patient did not meet criteria for treatment.  Most recent labs with continued elevated IgG and elevated liver enzymes   # Hepatic encephalopathy None - Avoid opiates or benzodiazepines   # Ascites - Low sodium diet None on exam and recent imaging   # Esophageal varices - Last EGD 2024 without cirrhosis,    Given evidence of portal hypertension and thrombocytopenia patient likely has clinically significant portal hypertension and would benefit from nonselective beta-blocker.  Will obtain echocardiogram to ensure there is no heart failure and will start patient on carvedilol  3.125 twice daily to titrate to heart rate to 55-60 and SBP more than 90 mmHg   # HCC screening - Last abdominal imaging 10/2023 without any focal lesion -Repeat right upper quadrant sono plus AFP every 6 months .    Recommendation    - will repeat hepatitis A,B serologies in future since patient got vaccinated against A and B along with MELD labs  - Schedule liver US  now - Reduce salt intake to <2 g per day - Can take Tylenol  max of 2 g per day (650 mg q8h) for pain - Avoid NSAIDs for pain - Avoid eating raw oysters/shellfish - Protein shake (Ensure or Boost) every night before going to  sleep -carvedilol  3.125 mg twice daily for prophylaxis against esophageal varices and portal hypertension as she has cirrhosis .    -Will pursue liver biopsy for evaluation of autoimmune hepatitis given elevation in IgG and elevated liver enzymes if patient has AIP, regardless of degree of elevation liver enzymes with having cirrhosis that would be a criteria for treatment itself   #Positive cologuard   Patient is scheduled for colonoscopy 06/17/2024   All questions were answered.      Christophr Calix Faizan Amaris Delafuente, MD Gastroenterology and Hepatology [facility] Gastroenterology   This chart has been completed using [facility] Dictation software, and while attempts have been made to ensure accuracy , certain words and phrases may not be transcribed as intended

## 2024-05-31 NOTE — Patient Instructions (Addendum)
 It was very nice to meet you today, as dicussed with will plan for the following :  1) colonoscopy  2) ultrasound abdomen   3) Liver biopsy

## 2024-06-06 ENCOUNTER — Ambulatory Visit (INDEPENDENT_AMBULATORY_CARE_PROVIDER_SITE_OTHER): Admitting: Psychiatry

## 2024-06-06 ENCOUNTER — Ambulatory Visit (HOSPITAL_COMMUNITY): Admission: RE | Admit: 2024-06-06

## 2024-06-06 DIAGNOSIS — F411 Generalized anxiety disorder: Secondary | ICD-10-CM

## 2024-06-06 DIAGNOSIS — F331 Major depressive disorder, recurrent, moderate: Secondary | ICD-10-CM
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
## 2024-06-10 ENCOUNTER — Ambulatory Visit (HOSPITAL_COMMUNITY): Admission: RE | Admit: 2024-06-10 | Discharge: 2024-06-10 | Attending: Gastroenterology

## 2024-06-10 DIAGNOSIS — K7460 Unspecified cirrhosis of liver: Secondary | ICD-10-CM

## 2024-06-17 ENCOUNTER — Encounter (HOSPITAL_COMMUNITY): Payer: Self-pay | Admitting: Gastroenterology

## 2024-06-17 ENCOUNTER — Ambulatory Visit (HOSPITAL_COMMUNITY)
Admission: RE | Admit: 2024-06-17 | Discharge: 2024-06-17 | Disposition: A | Discharge status: Home / Self Care | Attending: Gastroenterology | Admitting: Gastroenterology

## 2024-06-17 ENCOUNTER — Encounter (HOSPITAL_COMMUNITY): Admission: RE | Disposition: A | Payer: Self-pay | Source: Home / Self Care | Attending: Gastroenterology

## 2024-06-17 ENCOUNTER — Other Ambulatory Visit: Payer: Self-pay

## 2024-06-17 ENCOUNTER — Ambulatory Visit (HOSPITAL_COMMUNITY): Admitting: Anesthesiology

## 2024-06-17 DIAGNOSIS — I10 Essential (primary) hypertension: Secondary | ICD-10-CM

## 2024-06-17 DIAGNOSIS — K5730 Diverticulosis of large intestine without perforation or abscess without bleeding: Secondary | ICD-10-CM

## 2024-06-17 DIAGNOSIS — K6389 Other specified diseases of intestine: Secondary | ICD-10-CM | POA: Diagnosis not present

## 2024-06-17 DIAGNOSIS — Z1211 Encounter for screening for malignant neoplasm of colon: Secondary | ICD-10-CM | POA: Diagnosis present

## 2024-06-17 DIAGNOSIS — F17200 Nicotine dependence, unspecified, uncomplicated: Secondary | ICD-10-CM | POA: Diagnosis not present

## 2024-06-17 DIAGNOSIS — E119 Type 2 diabetes mellitus without complications: Secondary | ICD-10-CM | POA: Diagnosis not present

## 2024-06-17 DIAGNOSIS — G4730 Sleep apnea, unspecified: Secondary | ICD-10-CM | POA: Insufficient documentation

## 2024-06-17 DIAGNOSIS — K648 Other hemorrhoids: Secondary | ICD-10-CM

## 2024-06-17 DIAGNOSIS — R195 Other fecal abnormalities: Secondary | ICD-10-CM | POA: Diagnosis not present

## 2024-06-17 DIAGNOSIS — D122 Benign neoplasm of ascending colon: Secondary | ICD-10-CM

## 2024-06-17 DIAGNOSIS — J45909 Unspecified asthma, uncomplicated: Secondary | ICD-10-CM | POA: Diagnosis not present

## 2024-06-17 DIAGNOSIS — E039 Hypothyroidism, unspecified: Secondary | ICD-10-CM | POA: Diagnosis not present

## 2024-06-17 DIAGNOSIS — R188 Other ascites: Secondary | ICD-10-CM | POA: Diagnosis not present

## 2024-06-17 DIAGNOSIS — K5520 Angiodysplasia of colon without hemorrhage: Secondary | ICD-10-CM

## 2024-06-17 DIAGNOSIS — J449 Chronic obstructive pulmonary disease, unspecified: Secondary | ICD-10-CM | POA: Diagnosis not present

## 2024-06-17 HISTORY — PX: COLONOSCOPY: SHX5424

## 2024-06-17 HISTORY — PX: POLYPECTOMY: SHX149

## 2024-06-17 LAB — HM COLONOSCOPY

## 2024-06-17 SURGERY — COLONOSCOPY
Anesthesia: Monitor Anesthesia Care

## 2024-06-17 MED ORDER — LACTATED RINGERS IV SOLN: INTRAVENOUS | Status: DC | PRN

## 2024-06-17 MED ORDER — PROPOFOL 500 MG/50ML IV EMUL
INTRAVENOUS | Status: DC | PRN
  Administered 2024-06-17: 100 ug/kg/min via INTRAVENOUS

## 2024-06-17 MED ORDER — LACTATED RINGERS IV SOLN: INTRAVENOUS | Status: DC

## 2024-06-17 MED ORDER — PROPOFOL 10 MG/ML IV BOLUS
INTRAVENOUS | Status: DC | PRN
  Administered 2024-06-17: 50 mg via INTRAVENOUS

## 2024-06-17 MED ORDER — STERILE WATER FOR IRRIGATION IR SOLN
Status: DC | PRN
  Administered 2024-06-17: 120 mL

## 2024-06-17 NOTE — Interval H&P Note (Signed)
 History and Physical Interval Note:  06/17/2024 7:35 AM  [name]  has presented today for surgery, with the diagnosis of POSITIVE COLOGUARD.  The various methods of treatment have been discussed with the patient and family. After consideration of risks, benefits and other options for treatment, the patient has consented to  Procedures with comments: COLONOSCOPY (N/A) - 815AM, ASA 1-2 as a surgical intervention.  The patient's history has been reviewed, patient examined, no change in status, stable for surgery.  I have reviewed the patient's chart and labs.  Questions were answered to the patient's satisfaction.     Deatrice FALCON Juwaun Inskeep

## 2024-06-17 NOTE — Transfer of Care (Signed)
 Immediate Anesthesia Transfer of Care Note  Patient: [name]  Procedure(s) Performed: COLONOSCOPY POLYPECTOMY, INTESTINE COLONOSCOPY, WITH ARGON PLASMA COAGULATION  Patient Location: PACU  Anesthesia Type:MAC  Level of Consciousness: awake and alert   Airway & Oxygen  Therapy: Patient Spontanous Breathing and Patient connected to nasal cannula oxygen   Post-op Assessment: Report given to RN and Post -op Vital signs reviewed and stable  Post vital signs: Reviewed and stable  Last Vitals:  Vitals Value Taken Time  BP 121/65 06/17/24 09:08  Temp 36.5 C 06/17/24 09:06  Pulse 77 06/17/24 09:06  Resp 16 06/17/24 09:06  SpO2 96 % 06/17/24 09:08    Last Pain:  Vitals:   06/17/24 0908  TempSrc:   PainSc: 0-No pain      Patients Stated Pain Goal: 8 (06/17/24 0715)  Complications: No notable events documented.

## 2024-06-17 NOTE — Anesthesia Postprocedure Evaluation (Signed)
"   Anesthesia Post Note  Patient: [name]  Procedure(s) Performed: COLONOSCOPY POLYPECTOMY, INTESTINE COLONOSCOPY, WITH ARGON PLASMA COAGULATION  Patient location during evaluation: Phase II Anesthesia Type: MAC Level of consciousness: awake Pain management: pain level controlled Vital Signs Assessment: post-procedure vital signs reviewed and stable Respiratory status: spontaneous breathing and respiratory function stable Cardiovascular status: blood pressure returned to baseline and stable Postop Assessment: no headache and no apparent nausea or vomiting Anesthetic complications: no Comments: Late entry   No notable events documented.   Last Vitals:  Vitals:   06/17/24 0906 06/17/24 0908  BP:  121/65  Pulse: 77   Resp: 16   Temp: 36.5 C   SpO2:  96%    Last Pain:  Vitals:   06/17/24 0908  TempSrc:   PainSc: 0-No pain                 Yvonna PARAS Tala Eber      "

## 2024-06-17 NOTE — Discharge Instructions (Signed)

## 2024-06-17 NOTE — Op Note (Signed)
 [facility] Patient Name: [name] Procedure Date: 06/17/2024 8:07 AM MRN: [id number] Date of Birth: [birth year] Attending MD: Deatrice Dine , MD, [id number] CSN: [id number] Age: 58 Admit Type: Outpatient Procedure:                Colonoscopy Indications:              Positive Cologuard test Providers:                Deatrice Dine, MD, Olam Ada, RN, Daphne Mulch                            Technician, Technician Referring MD:             Deatrice Dine, MD Medicines:                Monitored Anesthesia Care Complications:            No immediate complications. Estimated Blood Loss:     Estimated blood loss was minimal. Procedure:                Pre-Anesthesia Assessment:                           - Prior to the procedure, a History and Physical                            was performed, and patient medications and                            allergies were reviewed. The patient's tolerance of                            previous anesthesia was also reviewed. The risks                            and benefits of the procedure and the sedation                            options and risks were discussed with the patient.                            All questions were answered, and informed consent                            was obtained. Prior Anticoagulants: The patient has                            taken no anticoagulant or antiplatelet agents                            except for aspirin . ASA Grade Assessment: III - A                            patient with severe systemic disease. After  reviewing the risks and benefits, the patient was                            deemed in satisfactory condition to undergo the                            procedure.                           After obtaining informed consent, the colonoscope                            was passed under direct vision. Throughout the                            procedure, the patient's  blood pressure, pulse, and                            oxygen  saturations were monitored continuously. The                            [id number]-[id number] ([id number]) Colon was introduced through                            the anus and advanced to the the cecum, identified                            by appendiceal orifice and ileocecal valve. The                            colonoscopy was performed without difficulty. The                            patient tolerated the procedure well. The quality                            of the bowel preparation was evaluated using the                            BBPS [facility] Bowel Preparation Scale) with scores                            of: Right Colon = 3, Transverse Colon = 3 and Left                            Colon = 3 (entire mucosa seen well with no residual                            staining, small fragments of stool or opaque                            liquid). The total BBPS score equals 9. The  ileocecal valve, appendiceal orifice, and rectum                            were photographed. Scope In: 8:29:17 AM Scope Out: 9:01:41 AM Scope Withdrawal Time: 0 hours 27 minutes 46 seconds  Total Procedure Duration: 0 hours 32 minutes 24 seconds  Findings:      The perianal and digital rectal examinations were normal.      A localized area of nodular mucosa was found in the proximal ascending       colon. Biopsies were taken with a cold forceps for histology.       Coagulation for hemostasis using argon plasma at 0.3 liters/minute and       20 watts was successful.      A localized area of nodular mucosa was found in the distal ascending       colon.      A few small-mouthed diverticula were found in the left colon.      Non-bleeding internal hemorrhoids were found during retroflexion. The       hemorrhoids were small. Impression:               - Nodular mucosa in the proximal ascending colon;                            polyp vs  angioectasia . Biopsied. Treated with                            argon plasma coagulation (APC) because of active                            bleeding encountered . Entire lesion was ablated                           - Nodular mucosa in the distal ascending colon,                            similar to proximal ascending colon mucosa ; no                            intervention performed .If proximal ascending                            mucosa lesion biopsy returns as polyp may recommend                            repeat colonoscopy for removal                           - Diverticulosis in the left colon.                           - Non-bleeding internal hemorrhoids. Moderate Sedation:      Per Anesthesia Care Recommendation:           - Patient has a contact number available for  emergencies. The signs and symptoms of potential                            delayed complications were discussed with the                            patient. Return to normal activities tomorrow.                            Written discharge instructions were provided to the                            patient.                           - Resume previous diet.                           - Continue present medications.                           - Await pathology results.                           - Repeat colonoscopy for surveillance based on                            pathology results.                           - Return to GI clinic as previously scheduled. Procedure Code(s):        --- Professional ---                           [phone], Colonoscopy, flexible; with control of                            bleeding, any method Diagnosis Code(s):        --- Professional ---                           K63.89, Other specified diseases of intestine                           K64.8, Other hemorrhoids                           R19.5, Other fecal abnormalities                           K57.30,  Diverticulosis of large intestine without                            perforation or abscess without bleeding CPT copyright 2022 American Medical Association. All rights reserved. The codes documented in this report are preliminary and upon coder review may  be revised to meet current compliance requirements. Deatrice Dine, MD Deatrice Dine, MD 06/17/2024  9:09:44 AM This report has been signed electronically. Number of Addenda: 0

## 2024-06-17 NOTE — Anesthesia Preprocedure Evaluation (Signed)
"                                    Anesthesia Evaluation  Patient identified by MRN, date of birth, ID band Patient awake    Reviewed: Allergy & Precautions, H&P , NPO status , Patient's Chart, lab work & pertinent test results, reviewed documented beta blocker date and time   History of Anesthesia Complications (+) PONV and history of anesthetic complications  Airway Mallampati: II  TM Distance: >3 FB Neck ROM: full    Dental no notable dental hx.    Pulmonary asthma , sleep apnea , COPD, Current Smoker and Patient abstained from smoking.   Pulmonary exam normal breath sounds clear to auscultation       Cardiovascular Exercise Tolerance: Good hypertension,  Rhythm:regular Rate:Normal     Neuro/Psych  PSYCHIATRIC DISORDERS Anxiety Depression    negative neurological ROS     GI/Hepatic negative GI ROS, Neg liver ROS,,,  Endo/Other  diabetesHypothyroidism    Renal/GU negative Renal ROS  negative genitourinary   Musculoskeletal   Abdominal   Peds  Hematology negative hematology ROS (+)   Anesthesia Other Findings   Reproductive/Obstetrics negative OB ROS                              Anesthesia Physical Anesthesia Plan  ASA: 3  Anesthesia Plan: MAC   Post-op Pain Management:    Induction:   PONV Risk Score and Plan: Propofol  infusion  Airway Management Planned:   Additional Equipment:   Intra-op Plan:   Post-operative Plan:   Informed Consent: I have reviewed the patients History and Physical, chart, labs and discussed the procedure including the risks, benefits and alternatives for the proposed anesthesia with the patient or authorized representative who has indicated his/her understanding and acceptance.     Dental Advisory Given  Plan Discussed with: CRNA  Anesthesia Plan Comments:         Anesthesia Quick Evaluation  "

## 2024-06-20 ENCOUNTER — Ambulatory Visit (INDEPENDENT_AMBULATORY_CARE_PROVIDER_SITE_OTHER): Payer: Self-pay | Admitting: Gastroenterology

## 2024-06-20 ENCOUNTER — Encounter (HOSPITAL_COMMUNITY): Payer: Self-pay | Admitting: Gastroenterology

## 2024-06-20 LAB — SURGICAL PATHOLOGY

## 2024-06-20 NOTE — Progress Notes (Signed)
 RU sono -HCC screening 05/2024   IMPRESSION: 1. Cirrhotic liver morphology. No focal lesion identified. Given underlying heterogeneity, consider intermittent supplemental screening with dedicated multiphase contrast enhanced CT or MRI. 2. Cholelithiasis. Mild gallbladder wall prominence is nonspecific and may be related to underlying liver disease or degree of chronic underlying cholecystitis. If there is clinical concern for acute cholecystitis, recommend dedicated HIDA scan.  Repeat in 6 months

## 2024-06-21 ENCOUNTER — Encounter (INDEPENDENT_AMBULATORY_CARE_PROVIDER_SITE_OTHER): Payer: Self-pay | Admitting: *Deleted

## 2024-06-21 NOTE — Progress Notes (Signed)
 10 yr TCS noted in recall Patient result letter mailed procedure note and pathology result faxed to PCP

## 2024-06-27 ENCOUNTER — Encounter (HOSPITAL_COMMUNITY): Payer: Self-pay | Admitting: Registered Nurse

## 2024-06-27 ENCOUNTER — Telehealth (HOSPITAL_COMMUNITY): Payer: Self-pay | Admitting: *Deleted

## 2024-06-27 ENCOUNTER — Other Ambulatory Visit (HOSPITAL_COMMUNITY): Payer: Self-pay | Admitting: Registered Nurse

## 2024-06-27 DIAGNOSIS — G4700 Insomnia, unspecified: Secondary | ICD-10-CM

## 2024-06-27 DIAGNOSIS — F331 Major depressive disorder, recurrent, moderate: Secondary | ICD-10-CM

## 2024-06-27 MED ORDER — FLUOXETINE HCL 20 MG PO CAPS: 20.0000 mg | ORAL_CAPSULE | Freq: Every day | ORAL | 0 refills | Status: DC

## 2024-06-27 MED ORDER — MIRTAZAPINE 15 MG PO TABS: 15.0000 mg | ORAL_TABLET | Freq: Every day | ORAL | 0 refills | Status: DC

## 2024-06-27 NOTE — Telephone Encounter (Signed)
 Patient pharmacy [facility] in [place] requesting 90 days for patient Mirtazapine  and Fluoxetine  HCL. Meds were last filled 01/18/2024.

## 2024-07-07 NOTE — Telephone Encounter (Signed)
 noted

## 2024-07-13 ENCOUNTER — Other Ambulatory Visit (HOSPITAL_COMMUNITY): Payer: Self-pay | Admitting: Radiology

## 2024-07-13 DIAGNOSIS — K7460 Unspecified cirrhosis of liver: Secondary | ICD-10-CM

## 2024-07-14 ENCOUNTER — Ambulatory Visit (HOSPITAL_COMMUNITY)
Admission: RE | Admit: 2024-07-14 | Discharge: 2024-07-14 | Disposition: A | Discharge status: Home / Self Care | Source: Ambulatory Visit | Attending: Gastroenterology | Admitting: Gastroenterology

## 2024-07-14 ENCOUNTER — Other Ambulatory Visit: Payer: Self-pay

## 2024-07-14 DIAGNOSIS — Z79890 Hormone replacement therapy (postmenopausal): Secondary | ICD-10-CM | POA: Insufficient documentation

## 2024-07-14 DIAGNOSIS — K739 Chronic hepatitis, unspecified: Secondary | ICD-10-CM | POA: Diagnosis not present

## 2024-07-14 DIAGNOSIS — K7460 Unspecified cirrhosis of liver: Secondary | ICD-10-CM | POA: Insufficient documentation

## 2024-07-14 DIAGNOSIS — Z79899 Other long term (current) drug therapy: Secondary | ICD-10-CM | POA: Insufficient documentation

## 2024-07-14 DIAGNOSIS — F17210 Nicotine dependence, cigarettes, uncomplicated: Secondary | ICD-10-CM | POA: Diagnosis not present

## 2024-07-14 DIAGNOSIS — E119 Type 2 diabetes mellitus without complications: Secondary | ICD-10-CM | POA: Insufficient documentation

## 2024-07-14 DIAGNOSIS — R059 Cough, unspecified: Secondary | ICD-10-CM | POA: Insufficient documentation

## 2024-07-14 DIAGNOSIS — E669 Obesity, unspecified: Secondary | ICD-10-CM | POA: Diagnosis not present

## 2024-07-14 DIAGNOSIS — J4489 Other specified chronic obstructive pulmonary disease: Secondary | ICD-10-CM | POA: Diagnosis not present

## 2024-07-14 DIAGNOSIS — H409 Unspecified glaucoma: Secondary | ICD-10-CM | POA: Diagnosis not present

## 2024-07-14 LAB — CBC
HCT: 38.7 % (ref 36.0–46.0)
Hemoglobin: 13.8 g/dL (ref 12.0–15.0)
MCH: 35.4 pg — ABNORMAL HIGH (ref 26.0–34.0)
MCHC: 35.7 g/dL (ref 30.0–36.0)
MCV: 99.2 fL (ref 80.0–100.0)
Platelets: 83 K/uL — ABNORMAL LOW (ref 150–400)
RBC: 3.9 MIL/uL (ref 3.87–5.11)
RDW: 13.6 % (ref 11.5–15.5)
WBC: 3.1 K/uL — ABNORMAL LOW (ref 4.0–10.5)
nRBC: 0 % (ref 0.0–0.2)

## 2024-07-14 LAB — PROTIME-INR
INR: 1.2 (ref 0.8–1.2)
Prothrombin Time: 15.6 s — ABNORMAL HIGH (ref 11.4–15.2)

## 2024-07-14 MED ORDER — SODIUM CHLORIDE 0.9 % IV SOLN: INTRAVENOUS | Status: DC

## 2024-07-14 MED ORDER — DIPHENHYDRAMINE HCL 50 MG/ML IJ SOLN
12.5000 mg | Freq: Once | INTRAMUSCULAR | Status: AC
  Administered 2024-07-14: 12.5 mg via INTRAVENOUS
  Filled 2024-07-14: qty 1

## 2024-07-14 MED ORDER — MIDAZOLAM HCL 2 MG/2ML IJ SOLN
INTRAMUSCULAR | Status: AC
  Filled 2024-07-14: qty 2

## 2024-07-14 MED ORDER — GELATIN ABSORBABLE 12-7 MM EX MISC
1.0000 | Freq: Once | CUTANEOUS | Status: AC
  Administered 2024-07-14: 1 via TOPICAL

## 2024-07-14 MED ORDER — DIPHENHYDRAMINE HCL 50 MG/ML IJ SOLN
INTRAMUSCULAR | Status: AC
  Filled 2024-07-14: qty 1

## 2024-07-14 MED ORDER — DIPHENHYDRAMINE HCL 50 MG/ML IJ SOLN
INTRAMUSCULAR | Status: AC | PRN
  Administered 2024-07-14: 25 mg via INTRAVENOUS

## 2024-07-14 MED ORDER — DIPHENHYDRAMINE HCL 12.5 MG/5ML PO ELIX
12.5000 mg | ORAL_SOLUTION | Freq: Once | ORAL | Status: DC
  Filled 2024-07-14 (×2): qty 5

## 2024-07-14 MED ORDER — LIDOCAINE HCL (PF) 1 % IJ SOLN
10.0000 mL | Freq: Once | INTRAMUSCULAR | Status: AC
  Administered 2024-07-14: 10 mL via INTRADERMAL

## 2024-07-14 MED ORDER — FENTANYL CITRATE (PF) 100 MCG/2ML IJ SOLN
INTRAMUSCULAR | Status: AC | PRN
  Administered 2024-07-14 (×2): 50 ug via INTRAVENOUS

## 2024-07-14 MED ORDER — FENTANYL CITRATE (PF) 100 MCG/2ML IJ SOLN
INTRAMUSCULAR | Status: AC
  Filled 2024-07-14: qty 2

## 2024-07-14 MED ORDER — MIDAZOLAM HCL (PF) 2 MG/2ML IJ SOLN
INTRAMUSCULAR | Status: AC | PRN
  Administered 2024-07-14: 1 mg via INTRAVENOUS
  Administered 2024-07-14: .5 mg via INTRAVENOUS
  Administered 2024-07-14: 1 mg via INTRAVENOUS

## 2024-07-14 NOTE — H&P (Signed)
 "   Chief Complaint: Cirrhosis  Referring Provider(s): Ahmed,Muhammad F   Supervising Physician: Hughes Simmonds  Patient Status: [facility] - Out-pt  History of Present Illness: [name] is a 59 y.o. female with PMH COPD, glaucoma, obesity, DM, cirrhosis who is followed by Dr. Cinderella with GI and hepatology. Ongoing work up for cirrhosis has raised concern for autoimmune hepatitis leading to request for IR liver bx. She presents today for this procedure.   Confirms NPO since MN and ride/supervision available for 24 hours.  Does not wear CPAP or use supplemental home O2.  Denies fever, chills, SOB, CP, sore throat, N/V, abd pain, blood in stool or urine, abnormal bruising. Her only complaint is anxiety about the procedure. She endorses baseline dry cough.   Allergies Reviewed:  Zoloft  [sertraline  hcl] and Metformin   Patient is Full Code  Past Medical History:  Diagnosis Date   Anxiety    Arthritis    Asthma    Blind    Chronic respiratory failure (HCC)    COPD (chronic obstructive pulmonary disease) (HCC)    Depression    Glaucoma    Hyperlipidemia    Hypothyroidism    PONV (postoperative nausea and vomiting)    Thyroid disease     Past Surgical History:  Procedure Laterality Date   ANKLE SURGERY Right 2007   CATARACT EXTRACTION W/PHACO Left 02/27/2023   Procedure: CATARACT EXTRACTION PHACO AND INTRAOCULAR LENS PLACEMENT (IOC);  Surgeon: Harrie Agent, MD;  Location: [facility];  Service: Ophthalmology;  Laterality: Left;  CDE 9.85   COLONOSCOPY N/A 06/17/2024   Procedure: COLONOSCOPY;  Surgeon: Cinderella Deatrice FALCON, MD;  Location: [facility];  Service: Endoscopy;  Laterality: N/A;  815AM, ASA 1-2   COLONOSCOPY  06/17/2024   Procedure: COLONOSCOPY, WITH ARGON PLASMA COAGULATION;  Surgeon: Cinderella Deatrice FALCON, MD;  Location: [facility];  Service: Endoscopy;;   ENDOMETRIAL ABLATION     ORIF ANKLE FRACTURE Left 12/01/2012   Procedure: OPEN REDUCTION INTERNAL FIXATION  (ORIF) ANKLE FRACTURE;  Surgeon: Lemond Stable, MD;  Location: [facility];  Service: Orthopedics;  Laterality: Left;   POLYPECTOMY  06/17/2024   Procedure: POLYPECTOMY, INTESTINE;  Surgeon: Cinderella Deatrice FALCON, MD;  Location: [facility];  Service: Endoscopy;;   TONSILLECTOMY     TUBAL LIGATION     TUBAL LIGATION     UMBILICAL HERNIA REPAIR N/A 12/29/2023   Procedure: REPAIR, HERNIA, UMBILICAL, ADULT WITH MESH;  Surgeon: Kallie Manuelita BROCKS, MD;  Location: [facility];  Service: General;  Laterality: N/A;  W/ MESH      Medications: Prior to Admission medications  Medication Sig Start Date End Date Taking? Authorizing Provider  carvedilol  (COREG ) 3.125 MG tablet Take 1 tablet (3.125 mg total) by mouth 2 (two) times daily with a meal. 05/05/24 08/03/24 Yes Ahmed, Deatrice FALCON, MD  FLUoxetine  (PROZAC ) 20 MG capsule Take 1 capsule (20 mg total) by mouth daily. 06/27/24  Yes Rankin, Shuvon B, NP  latanoprost (XALATAN) 0.005 % ophthalmic solution Place 1 drop into both eyes at bedtime.    Yes [provider]  levothyroxine  (SYNTHROID ) 150 MCG tablet Take 100 mcg by mouth at bedtime. Patient taking differently: Take 88 mcg by mouth at bedtime. 01/15/21  Yes [provider]  mirtazapine  (REMERON ) 15 MG tablet Take 1 tablet (15 mg total) by mouth at bedtime. 06/27/24  Yes Rankin, Shuvon B, NP     Family History  Problem Relation Age of Onset   Cancer Mother  Heart failure Father    Depression Daughter    Other Son        MVA    Social History   Socioeconomic History   Marital status: Widowed    Spouse name: Not on file   Number of children: 2   Years of education: 75   Highest education level: Not on file  Occupational History   Occupation: N/A  Tobacco Use   Smoking status: Every Day    Current packs/day: 0.50    Average packs/day: 0.5 packs/day for 39.0 years (19.5 ttl pk-yrs)    Types: Cigarettes   Smokeless tobacco: Never  Vaping Use   Vaping status: Never Used   Substance and Sexual Activity   Alcohol use: No   Drug use: No   Sexual activity: Not Currently    Birth control/protection: Surgical    Comment: tubal  Other Topics Concern   Not on file  Social History Narrative   Lives at home w/ roommates   Right-handed   Caffeine: occasional coffee   Social Drivers of Health   Tobacco Use: High Risk (06/27/2024)   Patient History    Smoking Tobacco Use: Every Day    Smokeless Tobacco Use: Never    Passive Exposure: Not on file  Financial Resource Strain: Not on file  Food Insecurity: Not on file  Transportation Needs: Not on file  Physical Activity: Not on file  Stress: Not on file  Social Connections: Not on file  Depression (PHQ2-9): High Risk (12/17/2023)   Depression (PHQ2-9)    PHQ-2 Score: 18  Alcohol Screen: Low Risk (09/21/2023)   Alcohol Screen    Last Alcohol Screening Score (AUDIT): 0  Housing: Not on file  Utilities: Not on file  Health Literacy: Not on file     Review of Systems: A 12 point ROS discussed and pertinent positives are indicated in the HPI above.  All other systems are negative.   Vital Signs: BP (!) 143/64   Pulse 74   Temp 98 F (36.7 C) (Oral)   Resp 20   Ht 5' 4.5 (1.638 m)   Wt 229 lb (103.9 kg)   LMP 10/20/2013   SpO2 91%   BMI 38.70 kg/m     Physical Exam Constitutional:      Appearance: She is obese.  HENT:     Mouth/Throat:     Mouth: Mucous membranes are moist.     Pharynx: Oropharynx is clear.  Cardiovascular:     Rate and Rhythm: Normal rate and regular rhythm.     Pulses: Normal pulses.     Heart sounds: Normal heart sounds.  Pulmonary:     Effort: Pulmonary effort is normal.     Breath sounds: Normal breath sounds.  Abdominal:     General: Bowel sounds are normal. There is no distension.     Palpations: Abdomen is soft.     Tenderness: There is no abdominal tenderness. There is no guarding.  Musculoskeletal:     Right lower leg: No edema.     Left lower leg: No  edema.  Skin:    General: Skin is warm and dry.     Comments: No rash or wound over planned puncture site  Neurological:     Mental Status: She is oriented to person, place, and time.  Psychiatric:        Mood and Affect: Mood normal.        Behavior: Behavior normal.  Thought Content: Thought content normal.     Imaging: No results found.  Labs:  CBC: Recent Labs    09/21/23 1410 11/23/23 0407 07/14/24 1248  WBC 3.4* 3.1* 3.1*  HGB 13.7 12.2 13.8  HCT 40.1 34.3* 38.7  PLT 97* 85* 83*    COAGS: Recent Labs    11/23/23 0407 04/25/24 1115 07/14/24 1248  INR 1.2 1.1 1.2    BMP: Recent Labs    09/21/23 1410 11/23/23 0406 04/25/24 1115  NA 139 135 138  K 4.1 4.0 4.0  CL 107 107 107*  CO2 24 24 20   GLUCOSE 77 122* 81  BUN 12 11 10   CALCIUM 8.9 8.3* 8.3*  CREATININE 0.56 0.58 0.61  GFRNONAA  --  >60  --     LIVER FUNCTION TESTS: Recent Labs    09/21/23 1410 04/25/24 1115  BILITOT 0.9 1.0  AST 39* 41*  ALT 26 24  ALKPHOS  --  110  PROT 6.9 6.1  ALBUMIN  --  3.1*    TUMOR MARKERS: No results for input(s): AFPTM, CEA, CA199, CHROMGRNA in the last 8760 hours.  Assessment and Plan:  Patient presents for image guided percutaneous random liver bx.  No contraindications for procedure identified in ROS, physical exam, or review of pre-sedation considerations. Labs reviewed and within acceptable range. INR 1.2 12/12 abd US  imaging available and reviewed: Cirrhotic liver morphology. No focal lesion identified. Given underlying heterogeneity, consider intermittent supplemental screening with dedicated multiphase contrast enhanced CT or MRI. VSS, afebrile Patient does not take a blood thinner Abx not indicated    Risks and benefits of random liver biopsy was discussed with the patient and/or patient's family including, but not limited to bleeding, infection, damage to adjacent structures or low yield requiring additional tests.  All of  the questions were answered and there is agreement to proceed.  Consent signed and in chart.   Thank you for allowing our service to participate in [name] 's care.    Electronically Signed: Laymon Coast, NP   07/14/2024, 1:22 PM     I spent a total of  30 Minutes   in face to face in clinical consultation, greater than 50% of which was counseling/coordinating care for image guided random liver biopsy.    (A copy of this note was sent to the referring provider and the time of visit.)  "

## 2024-07-14 NOTE — Sedation Documentation (Signed)
 After procedure patient continued to tap left hand and tap head with left head intermittently along with intermittent mouth opening wide.

## 2024-07-14 NOTE — Sedation Documentation (Signed)
 Pre procedure patient taps left hand and tap head with left head intermittently along with intermittent mouth opening wide. Patient stated is nervous and did not take her anxiety medication today.

## 2024-07-14 NOTE — Progress Notes (Addendum)
 After arrival to short stay patient complains of itching to her arm and chest. Does not report any feeling of swelling at this time or issues with SOB. PA Huneycutt made aware 12.5 IV benadryl  ordered and administered.   1550- Called MD Huneycutt to verify if patient discharge time should be postponed d/t pt being slightly drowsy still, NP asked to keep patient another 30 min past discharge time   1715-Patient able to tolerate PO intake, patient able to dress herself and ambulate to wheelchair, patient seems more awake and alert at this time. Patient states itching significantly improved at this time.

## 2024-07-14 NOTE — Procedures (Signed)
 Vascular and Interventional Radiology Procedure Note  Patient: KELSIE ZABOROWSKI DOB: [birth year] Medical Record Number: [id number] Note Date/Time: 07/14/24 1:40 PM   Performing Physician: Thom Hall, MD Assistant(s): None  Diagnosis: Cirrhosis. Q AIH   Procedure: LIVER BIOPSY  Anesthesia: Conscious Sedation Complications: None Estimated Blood Loss: Minimal Specimens: Sent for Pathology  Findings:  Successful Ultrasound-guided biopsy of liver. A total of 3 samples were obtained. Hemostasis of the tract was achieved using Gelfoam Slurry Embolization.  Plan: Bed rest for 2 hours.  See detailed procedure note with images in PACS. The patient tolerated the procedure well without incident or complication and was returned to Recovery in stable condition.    Thom Hall, MD [facility] Inter[facility] Radiology [facility] Radiology   Pager. [phone] Clinic. [phone]

## 2024-07-20 LAB — SURGICAL PATHOLOGY

## 2024-07-30 ENCOUNTER — Other Ambulatory Visit (INDEPENDENT_AMBULATORY_CARE_PROVIDER_SITE_OTHER): Payer: Self-pay | Admitting: Gastroenterology

## 2024-07-30 DIAGNOSIS — K7460 Unspecified cirrhosis of liver: Secondary | ICD-10-CM

## 2024-08-01 ENCOUNTER — Encounter (HOSPITAL_COMMUNITY): Payer: Self-pay | Admitting: Registered Nurse

## 2024-08-01 ENCOUNTER — Telehealth (HOSPITAL_COMMUNITY): Admitting: Registered Nurse

## 2024-08-01 DIAGNOSIS — G4700 Insomnia, unspecified: Secondary | ICD-10-CM

## 2024-08-01 DIAGNOSIS — F331 Major depressive disorder, recurrent, moderate: Secondary | ICD-10-CM

## 2024-08-01 MED ORDER — FLUOXETINE HCL 20 MG PO CAPS: 20.0000 mg | ORAL_CAPSULE | Freq: Every day | ORAL | 0 refills | Status: AC

## 2024-08-01 MED ORDER — MIRTAZAPINE 15 MG PO TABS: 15.0000 mg | ORAL_TABLET | Freq: Every day | ORAL | 1 refills | Status: AC

## 2024-08-01 NOTE — Patient Instructions (Signed)
 If no one has contacted, you by the end of business day today please call the appropriate office listed below to schedule your next visit for medication management with Luisa Ruder, NP:    [facility] [address] [place], [place], [place], [place] [address]  Phone: [phone] (Call to schedule appointment)  [facility] [address] [place], [place] [address] Phone: [phone] (Call to schedule appointment)   If you experience suicidal or homicidal thoughts, hallucinations, or a severe decline in your mental health, seek immediate help. You can: Call 911 Call [address] [facility] Suicide Prevention Lifeline) [phone]. This is a hotline for Spanish speakers. Use mobile crisis services Go to the nearest emergency room Veterans can also: Call 988 and press 1 Text ([phone] Mental health includes understanding and managing your emotions and behaviors in healthy ways. If you notice signs of emotional or mental distress, reach out to trusted supports--family, friends, healthcare providers, or mental health professionals. Healthy mental habits include stress-management skills, calming strategies, regular exercise, good sleep, healthy eating, and maintaining supportive relationships. This information does not replace advice from your healthcare provider--discuss any questions or concerns directly with them.  Mobile Crisis Response Teams Listed by counties in vicinity of [facility] providers [facility] [phone] [facility] [phone] [facility] [phone] [facility] [phone] [place]                * [facility] [phone]                * [employer] [phone]  [facility]  [phone] [facility] [employer]  [phone] * [employer] [phone]   Call 988 (National Suicide Prevention Lifeline)

## 2024-08-05 ENCOUNTER — Telehealth (INDEPENDENT_AMBULATORY_CARE_PROVIDER_SITE_OTHER): Payer: Self-pay | Admitting: Gastroenterology

## 2024-08-05 NOTE — Telephone Encounter (Signed)
 Pt left voicemail in regards to liver biopsy that was completed on 07/14/24. Returned call to patient and advised her I would get a message back to the provider to see if he can go over results and I would give her a call back once provider responded. Pt verbalized understanding. Please advise. Thank you!

## 2024-08-10 ENCOUNTER — Ambulatory Visit (HOSPITAL_COMMUNITY): Admitting: Psychiatry

## 2024-08-26 ENCOUNTER — Ambulatory Visit (HOSPITAL_COMMUNITY): Admitting: Psychiatry

## 2024-09-09 ENCOUNTER — Ambulatory Visit (HOSPITAL_COMMUNITY): Admitting: Psychiatry

## 2024-09-23 ENCOUNTER — Ambulatory Visit (HOSPITAL_COMMUNITY): Admitting: Psychiatry

## 2024-10-31 ENCOUNTER — Telehealth (HOSPITAL_COMMUNITY): Admitting: Registered Nurse
